# Patient Record
Sex: Female | Born: 1950 | Race: White | Hispanic: No | Smoking: Former smoker
Health system: Southern US, Community
[De-identification: ages and names within clinical notes are randomized; demographics above are authoritative.]

## PROBLEM LIST (undated history)

## (undated) DIAGNOSIS — E785 Hyperlipidemia, unspecified: Secondary | ICD-10-CM

## (undated) DIAGNOSIS — M1990 Unspecified osteoarthritis, unspecified site: Secondary | ICD-10-CM

## (undated) DIAGNOSIS — F329 Major depressive disorder, single episode, unspecified: Secondary | ICD-10-CM

## (undated) DIAGNOSIS — N189 Chronic kidney disease, unspecified: Secondary | ICD-10-CM

## (undated) DIAGNOSIS — H3530 Unspecified macular degeneration: Secondary | ICD-10-CM

## (undated) DIAGNOSIS — J40 Bronchitis, not specified as acute or chronic: Secondary | ICD-10-CM

## (undated) DIAGNOSIS — H409 Unspecified glaucoma: Secondary | ICD-10-CM

## (undated) DIAGNOSIS — M8580 Other specified disorders of bone density and structure, unspecified site: Secondary | ICD-10-CM

## (undated) DIAGNOSIS — G4730 Sleep apnea, unspecified: Secondary | ICD-10-CM

## (undated) DIAGNOSIS — K219 Gastro-esophageal reflux disease without esophagitis: Secondary | ICD-10-CM

## (undated) DIAGNOSIS — F32A Depression, unspecified: Secondary | ICD-10-CM

## (undated) HISTORY — PX: CHOLECYSTECTOMY: SHX55

## (undated) HISTORY — PX: BUNIONECTOMY: SHX129

## (undated) HISTORY — DX: Hyperlipidemia, unspecified: E78.5

## (undated) HISTORY — DX: Unspecified glaucoma: H40.9

## (undated) HISTORY — DX: Bronchitis, not specified as acute or chronic: J40

## (undated) HISTORY — PX: HERNIA REPAIR: SHX51

## (undated) HISTORY — DX: Chronic kidney disease, unspecified: N18.9

## (undated) HISTORY — DX: Unspecified osteoarthritis, unspecified site: M19.90

## (undated) HISTORY — DX: Unspecified macular degeneration: H35.30

## (undated) HISTORY — DX: Major depressive disorder, single episode, unspecified: F32.9

## (undated) HISTORY — DX: Gastro-esophageal reflux disease without esophagitis: K21.9

## (undated) HISTORY — DX: Depression, unspecified: F32.A

---
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
## 2001-01-03 ENCOUNTER — Other Ambulatory Visit: Admission: RE | Admit: 2001-01-03 | Discharge: 2001-01-03 | Payer: Self-pay | Admitting: Family Medicine

## 2001-06-11 ENCOUNTER — Encounter: Admission: RE | Admit: 2001-06-11 | Discharge: 2001-06-11 | Payer: Self-pay | Admitting: Family Medicine

## 2001-06-11 ENCOUNTER — Encounter: Payer: Self-pay | Admitting: Family Medicine

## 2001-06-21 ENCOUNTER — Encounter: Payer: Self-pay | Admitting: Family Medicine

## 2001-06-21 ENCOUNTER — Encounter: Admission: RE | Admit: 2001-06-21 | Discharge: 2001-06-21 | Payer: Self-pay | Admitting: Family Medicine

## 2002-02-04 ENCOUNTER — Other Ambulatory Visit: Admission: RE | Admit: 2002-02-04 | Discharge: 2002-02-04 | Payer: Self-pay | Admitting: Family Medicine

## 2002-02-06 ENCOUNTER — Encounter: Payer: Self-pay | Admitting: Family Medicine

## 2002-02-10 ENCOUNTER — Ambulatory Visit (HOSPITAL_BASED_OUTPATIENT_CLINIC_OR_DEPARTMENT_OTHER): Admission: RE | Admit: 2002-02-10 | Discharge: 2002-02-10 | Payer: Self-pay | Admitting: Urology

## 2002-09-17 ENCOUNTER — Encounter: Admission: RE | Admit: 2002-09-17 | Discharge: 2002-09-17 | Payer: Self-pay | Admitting: Family Medicine

## 2002-09-17 ENCOUNTER — Encounter: Payer: Self-pay | Admitting: Family Medicine

## 2002-11-28 ENCOUNTER — Ambulatory Visit (HOSPITAL_COMMUNITY): Admission: RE | Admit: 2002-11-28 | Discharge: 2002-11-28 | Payer: Self-pay | Admitting: Gastroenterology

## 2002-11-28 ENCOUNTER — Encounter (INDEPENDENT_AMBULATORY_CARE_PROVIDER_SITE_OTHER): Payer: Self-pay | Admitting: *Deleted

## 2003-03-09 ENCOUNTER — Other Ambulatory Visit: Admission: RE | Admit: 2003-03-09 | Discharge: 2003-03-09 | Payer: Self-pay | Admitting: Family Medicine

## 2003-05-21 ENCOUNTER — Encounter: Admission: RE | Admit: 2003-05-21 | Discharge: 2003-05-21 | Payer: Self-pay | Admitting: Family Medicine

## 2003-12-24 ENCOUNTER — Encounter: Admission: RE | Admit: 2003-12-24 | Discharge: 2003-12-24 | Payer: Self-pay | Admitting: Family Medicine

## 2004-02-24 ENCOUNTER — Other Ambulatory Visit: Admission: RE | Admit: 2004-02-24 | Discharge: 2004-02-24 | Payer: Self-pay | Admitting: Family Medicine

## 2005-01-09 ENCOUNTER — Encounter: Admission: RE | Admit: 2005-01-09 | Discharge: 2005-01-09 | Payer: Self-pay | Admitting: Family Medicine

## 2005-03-31 ENCOUNTER — Other Ambulatory Visit: Admission: RE | Admit: 2005-03-31 | Discharge: 2005-03-31 | Payer: Self-pay | Admitting: Family Medicine

## 2006-03-05 ENCOUNTER — Encounter: Admission: RE | Admit: 2006-03-05 | Discharge: 2006-03-05 | Payer: Self-pay | Admitting: Family Medicine

## 2006-05-01 DIAGNOSIS — N189 Chronic kidney disease, unspecified: Secondary | ICD-10-CM

## 2006-05-01 HISTORY — DX: Chronic kidney disease, unspecified: N18.9

## 2007-06-10 ENCOUNTER — Encounter: Admission: RE | Admit: 2007-06-10 | Discharge: 2007-06-10 | Payer: Self-pay | Admitting: Family Medicine

## 2007-06-12 ENCOUNTER — Encounter: Admission: RE | Admit: 2007-06-12 | Discharge: 2007-06-12 | Payer: Self-pay | Admitting: Family Medicine

## 2007-12-31 ENCOUNTER — Encounter: Admission: RE | Admit: 2007-12-31 | Discharge: 2007-12-31 | Payer: Self-pay | Admitting: Family Medicine

## 2008-01-13 ENCOUNTER — Encounter: Admission: RE | Admit: 2008-01-13 | Discharge: 2008-01-13 | Payer: Self-pay | Admitting: Family Medicine

## 2008-01-30 LAB — CONVERTED CEMR LAB: Pap Smear: NORMAL

## 2008-02-03 ENCOUNTER — Ambulatory Visit (HOSPITAL_COMMUNITY): Admission: RE | Admit: 2008-02-03 | Discharge: 2008-02-03 | Payer: Self-pay | Admitting: Family Medicine

## 2008-03-23 ENCOUNTER — Ambulatory Visit (HOSPITAL_COMMUNITY): Admission: RE | Admit: 2008-03-23 | Discharge: 2008-03-24 | Payer: Self-pay | Admitting: General Surgery

## 2008-03-23 ENCOUNTER — Encounter (INDEPENDENT_AMBULATORY_CARE_PROVIDER_SITE_OTHER): Payer: Self-pay | Admitting: General Surgery

## 2008-04-16 ENCOUNTER — Ambulatory Visit: Payer: Self-pay | Admitting: Family Medicine

## 2008-04-16 DIAGNOSIS — R7309 Other abnormal glucose: Secondary | ICD-10-CM | POA: Insufficient documentation

## 2008-04-16 DIAGNOSIS — M1990 Unspecified osteoarthritis, unspecified site: Secondary | ICD-10-CM | POA: Insufficient documentation

## 2008-04-16 DIAGNOSIS — J45990 Exercise induced bronchospasm: Secondary | ICD-10-CM | POA: Insufficient documentation

## 2008-04-16 DIAGNOSIS — Z9889 Other specified postprocedural states: Secondary | ICD-10-CM | POA: Insufficient documentation

## 2008-04-16 DIAGNOSIS — J309 Allergic rhinitis, unspecified: Secondary | ICD-10-CM | POA: Insufficient documentation

## 2008-04-16 DIAGNOSIS — F3289 Other specified depressive episodes: Secondary | ICD-10-CM | POA: Insufficient documentation

## 2008-04-16 DIAGNOSIS — F329 Major depressive disorder, single episode, unspecified: Secondary | ICD-10-CM | POA: Insufficient documentation

## 2008-05-13 ENCOUNTER — Ambulatory Visit: Payer: Self-pay | Admitting: Family Medicine

## 2008-05-15 LAB — CONVERTED CEMR LAB
ALT: 25 units/L (ref 0–35)
BUN: 9 mg/dL (ref 6–23)
Bilirubin, Direct: 0.1 mg/dL (ref 0.0–0.3)
CO2: 29 meq/L (ref 19–32)
Cholesterol: 269 mg/dL (ref 0–200)
Creatinine, Ser: 1.4 mg/dL — ABNORMAL HIGH (ref 0.4–1.2)
Direct LDL: 199.7 mg/dL
GFR calc Af Amer: 50 mL/min
Glucose, Bld: 121 mg/dL — ABNORMAL HIGH (ref 70–99)
HDL: 43.5 mg/dL (ref >39.0)
Potassium: 5.3 meq/L — ABNORMAL HIGH (ref 3.5–5.1)
Total Bilirubin: 0.9 mg/dL (ref 0.3–1.2)
Total Protein: 6.7 g/dL (ref 6.0–8.3)
Triglycerides: 136 mg/dL (ref 0–149)
VLDL: 27 mg/dL (ref 0–40)

## 2008-05-20 ENCOUNTER — Ambulatory Visit: Payer: Self-pay | Admitting: Family Medicine

## 2008-05-20 DIAGNOSIS — E7800 Pure hypercholesterolemia, unspecified: Secondary | ICD-10-CM | POA: Insufficient documentation

## 2008-05-20 LAB — CONVERTED CEMR LAB
BUN: 10 mg/dL (ref 6–23)
CO2: 32 meq/L (ref 19–32)
Calcium: 10.1 mg/dL (ref 8.4–10.5)
Chloride: 107 meq/L (ref 96–112)
Potassium: 4.6 meq/L (ref 3.5–5.1)

## 2008-05-27 ENCOUNTER — Encounter: Admission: RE | Admit: 2008-05-27 | Discharge: 2008-05-27 | Payer: Self-pay | Admitting: Family Medicine

## 2008-05-29 ENCOUNTER — Encounter: Payer: Self-pay | Admitting: Family Medicine

## 2008-05-29 ENCOUNTER — Encounter: Admission: RE | Admit: 2008-05-29 | Discharge: 2008-05-29 | Payer: Self-pay | Admitting: Family Medicine

## 2008-05-29 ENCOUNTER — Encounter (INDEPENDENT_AMBULATORY_CARE_PROVIDER_SITE_OTHER): Payer: Self-pay | Admitting: *Deleted

## 2008-06-01 ENCOUNTER — Encounter (INDEPENDENT_AMBULATORY_CARE_PROVIDER_SITE_OTHER): Payer: Self-pay | Admitting: *Deleted

## 2008-08-13 ENCOUNTER — Ambulatory Visit: Payer: Self-pay | Admitting: Family Medicine

## 2008-08-20 ENCOUNTER — Ambulatory Visit: Payer: Self-pay | Admitting: Family Medicine

## 2008-08-20 DIAGNOSIS — N183 Chronic kidney disease, stage 3 (moderate): Secondary | ICD-10-CM

## 2008-08-20 LAB — CONVERTED CEMR LAB
ALT: 18 units/L (ref 0–35)
AST: 23 units/L (ref 0–37)
Albumin: 3.6 g/dL (ref 3.5–5.2)
Alkaline Phosphatase: 91 units/L (ref 39–117)
BUN: 13 mg/dL (ref 6–23)
Bilirubin, Direct: 0.1 mg/dL (ref 0.0–0.3)
CO2: 31 meq/L (ref 19–32)
Calcium: 9.5 mg/dL (ref 8.4–10.5)
Direct LDL: 197.5 mg/dL
Glucose, Bld: 129 mg/dL — ABNORMAL HIGH (ref 70–99)
HDL: 44.9 mg/dL (ref >39.00)
LDL Goal: 130 mg/dL
Potassium: 4.9 meq/L (ref 3.5–5.1)
Sodium: 142 meq/L (ref 135–145)
Total CHOL/HDL Ratio: 6
Total Protein: 6.7 g/dL (ref 6.0–8.3)
Triglycerides: 134 mg/dL (ref 0.0–149.0)
VLDL: 26.8 mg/dL (ref 0.0–40.0)

## 2008-11-23 ENCOUNTER — Ambulatory Visit: Payer: Self-pay | Admitting: Family Medicine

## 2008-11-25 LAB — CONVERTED CEMR LAB
AST: 20 units/L (ref 0–37)
Albumin: 3.8 g/dL (ref 3.5–5.2)
BUN: 12 mg/dL (ref 6–23)
CO2: 30 meq/L (ref 19–32)
Chloride: 106 meq/L (ref 96–112)
GFR calc non Af Amer: 44.54 mL/min (ref >60)
Glucose, Bld: 123 mg/dL — ABNORMAL HIGH (ref 70–99)
HDL: 42.5 mg/dL (ref >39.00)
Potassium: 4.8 meq/L (ref 3.5–5.1)
Total Bilirubin: 0.9 mg/dL (ref 0.3–1.2)
Total Protein: 6.8 g/dL (ref 6.0–8.3)
Triglycerides: 96 mg/dL (ref 0.0–149.0)

## 2009-01-11 ENCOUNTER — Telehealth: Payer: Self-pay | Admitting: Family Medicine

## 2009-05-28 ENCOUNTER — Ambulatory Visit: Payer: Self-pay | Admitting: Family Medicine

## 2009-06-02 ENCOUNTER — Encounter: Admission: RE | Admit: 2009-06-02 | Discharge: 2009-06-02 | Payer: Self-pay | Admitting: Family Medicine

## 2009-06-02 LAB — CONVERTED CEMR LAB
ALT: 20 units/L (ref 0–35)
AST: 20 units/L (ref 0–37)
HDL: 45.8 mg/dL (ref >39.00)
Total CHOL/HDL Ratio: 5
Triglycerides: 101 mg/dL (ref 0.0–149.0)

## 2009-06-09 ENCOUNTER — Other Ambulatory Visit: Admission: RE | Admit: 2009-06-09 | Discharge: 2009-06-09 | Payer: Self-pay | Admitting: Family Medicine

## 2009-06-09 ENCOUNTER — Ambulatory Visit: Payer: Self-pay | Admitting: Family Medicine

## 2009-06-09 LAB — CONVERTED CEMR LAB
Bilirubin Urine: NEGATIVE
Blood in Urine, dipstick: NEGATIVE
Ketones, urine, test strip: NEGATIVE
Nitrite: NEGATIVE
pH: 5

## 2009-06-11 ENCOUNTER — Encounter (INDEPENDENT_AMBULATORY_CARE_PROVIDER_SITE_OTHER): Payer: Self-pay | Admitting: *Deleted

## 2009-07-22 ENCOUNTER — Ambulatory Visit: Payer: Self-pay | Admitting: Family Medicine

## 2009-11-24 ENCOUNTER — Ambulatory Visit: Payer: Self-pay | Admitting: Family Medicine

## 2009-11-27 LAB — CONVERTED CEMR LAB
AST: 19 units/L (ref 0–37)
Alkaline Phosphatase: 80 units/L (ref 39–117)
CO2: 26 meq/L (ref 19–32)
Chloride: 103 meq/L (ref 96–112)
Creatinine, Ser: 1.2 mg/dL (ref 0.4–1.2)
Sodium: 135 meq/L (ref 135–145)
Total Bilirubin: 0.8 mg/dL (ref 0.3–1.2)
Total CHOL/HDL Ratio: 4
Triglycerides: 102 mg/dL (ref 0.0–149.0)
VLDL: 20.4 mg/dL (ref 0.0–40.0)

## 2009-12-23 ENCOUNTER — Ambulatory Visit: Payer: Self-pay | Admitting: Family Medicine

## 2010-01-13 ENCOUNTER — Emergency Department: Payer: Self-pay | Admitting: Emergency Medicine

## 2010-02-04 ENCOUNTER — Ambulatory Visit: Payer: Self-pay | Admitting: Family Medicine

## 2010-02-04 LAB — CONVERTED CEMR LAB
Nitrite: POSITIVE
Urobilinogen, UA: 0.2
pH: 6

## 2010-02-05 ENCOUNTER — Encounter: Payer: Self-pay | Admitting: Family Medicine

## 2010-02-21 ENCOUNTER — Ambulatory Visit: Payer: Self-pay | Admitting: Family Medicine

## 2010-05-03 ENCOUNTER — Ambulatory Visit
Admission: RE | Admit: 2010-05-03 | Discharge: 2010-05-03 | Payer: Self-pay | Source: Home / Self Care | Attending: Family Medicine | Admitting: Family Medicine

## 2010-05-05 LAB — CONVERTED CEMR LAB
ALT: 19 units/L (ref 0–35)
AST: 21 units/L (ref 0–37)
Cholesterol: 200 mg/dL (ref 0–200)
HDL: 43.8 mg/dL (ref >39.00)
LDL Cholesterol: 130 mg/dL — ABNORMAL HIGH (ref 0–99)
Total CHOL/HDL Ratio: 5
VLDL: 26 mg/dL (ref 0.0–40.0)

## 2010-05-31 NOTE — Assessment & Plan Note (Signed)
Summary: SINUS INFECTION/DLO   Vital Signs:  Patient profile:   60 year old female Height:      64 inches Weight:      190.50 pounds BMI:     32.82 Temp:     98.2 degrees F oral Pulse rate:   84 / minute Pulse rhythm:   regular BP sitting:   132 / 80  (left arm) Cuff size:   regular  Vitals Entered By: Delilah Shan CMA Duncan Dull) (February 21, 2010 12:14 PM) CC: ? sinus infection   History of Present Illness: "I'm some better today."  Sneezing started over the weekend.  Pain in face and watering eyes.  Prev with tooth pain.  Prev with ear pain.  Took pseudophed with some relief of symptoms but it made the patient jittery.  No fevers.  Occ cough but it happens in fits.  No sputum.  Nasal congestion is ongoing.  Clear rhinorrhea.    Allergies: No Known Drug Allergies  Review of Systems       See HPI.  Otherwise negative.    Physical Exam  General:  GEN: nad, alert and oriented HEENT: mucous membranes moist, TM w/o erythema, nasal epithelium injected, OP with cobblestoning, max sinuses minimally tender to palpation, frontal sinuses not tender to palpation  NECK: supple w/o LA CV: rrr. PULM: ctab, no inc wob ABD: soft, +bs EXT: no edema    Impression & Recommendations:  Problem # 1:  SINUSITIS - ACUTE-NOS (ICD-461.9) I d/w patient [id number].  I would hold on antibiotics for now and start nasal steroids.  If she resolves, no need to use antibiotics.  If symptoms worsen or persist, then I would fill the amoxil.  She agrees/understands.  If she continues to have persistent symptoms, then ENT eval may be of use.  The following medications were removed from the medication list:    Septra Ds 800-160 Mg Tabs (Sulfamethoxazole-trimethoprim) .Marland Kitchen... 1 by mouth two times a day x3days Her updated medication list for this problem includes:    Amoxicillin 875 Mg Tabs (Amoxicillin) .Marland Kitchen... 1 by mouth two times a day x10d    Flonase 50 Mcg/act Susp (Fluticasone propionate) .Marland Kitchen... 2 sprays per  nostril per day  Complete Medication List: 1)  Celexa 40 Mg Tabs (Citalopram hydrobromide) .... Take 1 tablet by mouth once a day 2)  Proair Hfa 108 (90 Base) Mcg/act Aers (Albuterol sulfate) .... As needed 3)  Qvar 40 Mcg/act Aers (Beclomethasone dipropionate) .... As needed 4)  Vitamin D 1000 Unit Tabs (Cholecalciferol) .... Take 1 tablet by mouth once a day 5)  Simvastatin 40 Mg Tabs (Simvastatin) .Marland Kitchen.. 1 tab by mouth daily 6)  Amoxicillin 875 Mg Tabs (Amoxicillin) .Marland Kitchen.. 1 by mouth two times a day x10d 7)  Flonase 50 Mcg/act Susp (Fluticasone propionate) .... 2 sprays per nostril per day  Patient Instructions: 1)  I would start the flonase and fill the antibiotics if you aren't getting better.  Using the flonase may make it less likely for you to have this as much in the future.  Take care.  Let us know if you have other concerns.   Prescriptions: FLONASE 50 MCG/ACT SUSP (FLUTICASONE PROPIONATE) 2 sprays per nostril per day  #1 x 12   Entered and Authorized by:   Crawford Givens MD   Signed by:   Crawford Givens MD on 02/21/2010   Method used:   Electronically to        [id number]  [address]       [place], [place]  [address]       Ph: [phone]       Fax: [phone]   RxID:   [id number] AMOXICILLIN 875 MG TABS (AMOXICILLIN) 1 by mouth two times a day x10d  #20 x 0   Entered and Authorized by:   Crawford Givens MD   Signed by:   Crawford Givens MD on 02/21/2010   Method used:   Print then Give to Patient   RxID:   [id number]    Orders Added: 1)  Est. Patient Level III [61607]    Current Allergies (reviewed today): No known allergies

## 2010-05-31 NOTE — Assessment & Plan Note (Signed)
Summary: cpx/r/s from 2:45   Vital Signs:  Patient profile:   60 year old female Height:      64 inches Weight:      190.38 pounds BMI:     32.80 Temp:     98.2 degrees F oral Pulse rate:   68 / minute Pulse rhythm:   regular BP sitting:   112 / 64  (left arm) Cuff size:   large  Vitals Entered By: Linde Gillis CMA Duncan Dull) (June 09, 2009 10:16 AM) CC: 30 minute exam, Lipid Management   History of Present Illness: Doing well overall. The patient is here for annual wellness exam and preventative care.    No acute concerns.  Depression, was previously well controlle don celexa..has been out due to insurance change in 03/2009. Requests refill.  7lb weight loss..eaeting healthfully.  Has gone back to work Systems developer. No  regular exercise.  Lipid Management History:      Positive NCEP/ATP III risk factors include female age 68 years old or older and current tobacco user.        The patient expresses understanding of adjunctive measures for cholesterol lowering.  Adjunctive measures started by the patient include aerobic exercise, fiber, and weight reduction.  She expresses no side effects from her lipid-lowering medication.  The patient denies any symptoms to suggest myopathy or liver disease.  Comments include: Poor control off simvastain. .     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 Take 1 Tablet By Mouth Once A Day 2)  Proair Hfa 108 (90 Base)  Mcg/act Aers (Albuterol Sulfate) .... As Needed 3)  Qvar 40 Mcg/act Aers (Beclomethasone Dipropionate) .... As Needed 4)  Vitamin D 1000 Unit  Tabs (Cholecalciferol) .... Take 1 Tablet By Mouth Once A Day 5)  Simvastatin 40 Mg Tabs (Simvastatin) .Marland Kitchen.. 1 Tab By Mouth Daily  Allergies (verified): No Known Drug Allergies  Past History:  Past medical, surgical, family and social histories (including risk factors) reviewed, and no changes noted (except as noted below).  Past Medical History: Reviewed history from 04/16/2008 and no changes required. Osteoarthritis, knees Depression Allergic rhinitis  Past Surgical History: Reviewed history from 04/16/2008 and no changes required. Cholecystectomy 03/23/08 Tonsillectomy  Family History: Reviewed history from 04/16/2008 and no changes required. father: prostate cancer mother: dementia brother:  DM sister; prediabetes NO CAD early age uncle: melanoma  Social History: Reviewed history from 04/16/2008 and no changes required. Retired Divorced Never Smoked Alcohol use-yes, occ once a month Current Smoker Drug use-no Regular exercise-no Diet:  moderate diet, good calcium intake  Review of Systems General:  Denies fatigue. CV:  Denies chest pain or discomfort. Resp:  Denies shortness of breath. GI:  Denies abdominal pain. GU:  Denies dysuria.  Physical Exam  General:  Well-developed,well-nourished,in no acute distress; alert,appropriate and cooperative throughout examination Ears:  External ear exam shows no significant lesions or deformities.  Otoscopic examination reveals clear canals, tympanic membranes are intact bilaterally without bulging, retraction, inflammation or discharge. Hearing is  grossly normal bilaterally. Nose:  External nasal examination shows no deformity or inflammation. Nasal mucosa are pink and moist without lesions or exudates. Mouth:  MMM Neck:  no carotid bruit or thyromegaly no cervical or  supraclavicular lymphadenopathy  Chest Wall:  No deformities, masses, or tenderness noted. Breasts:  No mass, nodules, thickening, tenderness, bulging, retraction, inflamation, nipple discharge or skin changes noted.   Lungs:  Normal respiratory effort, chest expands symmetrically. Lungs are clear to auscultation, no crackles or wheezes. Heart:  Normal rate and regular rhythm. S1 and S2 normal without gallop, murmur, click, rub or other extra sounds. Abdomen:  Bowel sounds positive,abdomen soft and non-tender without masses, organomegaly or hernias noted. Genitalia:  Pelvic Exam:        External: normal female genitalia without lesions or masses        Vagina: normal without lesions or masses        Cervix: normal without lesions or masses        Adnexa: normal bimanual exam without masses or fullness        Uterus: normal by palpation        Pap smear: performed Msk:  No deformity or scoliosis noted of thoracic or lumbar spine.   Pulses:  R and L posterior tibial pulses are full and equal bilaterally  Extremities:  no edema Skin:  Intact without suspicious lesions or rashes Psych:  Cognition and judgment appear intact. Alert and cooperative with normal attention span and concentration. No apparent delusions, illusions, hallucinations   Impression & Recommendations:  Problem # 1:  WELL WOMAN (ICD-V70.0) The patient's preventative maintenance and recommended screening tests for an annual wellness exam were reviewed in full today. Brought up to date unless services declined.  Counselled on the importance of diet, exercise, and its role in overall health and mortality. The patient's FH and SH was reviewed, including their home life, tobacco status, and drug and alcohol status.     Problem # 2:  ROUTINE GYNECOLOGICAL EXAMINATION (ICD-V72.31) PAp pending. NO concerning symptoms or signs.   Complete Medication List: 1)  Celexa 40 Mg Tabs (Citalopram hydrobromide) .... Take 1 tablet by  mouth once a day 2)  Proair Hfa 108 (90 Base) Mcg/act Aers (Albuterol sulfate) .... As needed 3)  Qvar 40 Mcg/act Aers (Beclomethasone dipropionate) .... As needed 4)  Vitamin D 1000 Unit Tabs (Cholecalciferol) .... Take 1 tablet by mouth once a day 5)  Simvastatin 40 Mg Tabs (Simvastatin) .Marland Kitchen.. 1 tab by mouth daily  Lipid Assessment/Plan:      Based on NCEP/ATP III, the patient's risk factor category is "2 or more risk factors and a calculated 10 year CAD risk of < 20%".  The patient's lipid goals are as follows: Total cholesterol goal is 200; LDL cholesterol goal is 130; HDL cholesterol goal is 40; Triglyceride goal is 150.  Her LDL cholesterol goal has not been met.    Patient Instructions: 1)  Fasting lipids, CMET Dx 272.0 in 6 months. 2)  Please schedule a follow-up appointment in 1 year.  Prescriptions: SIMVASTATIN 40 MG TABS (SIMVASTATIN) 1 tab by mouth daily  #90 x 3   Entered and Authorized by:   Kerby Nora MD   Signed by:   Kerby Nora MD on 06/09/2009   Method used:   Electronically to        [id number]       [address]       [place], [place]  [address]       Ph: [phone]       Fax: [phone]   RxID:   [id number] CELEXA 40 MG TABS (CITALOPRAM HYDROBROMIDE) Take 1 tablet by mouth once a day  #90 x 3   Entered and Authorized by:   Kerby Nora MD   Signed by:   Kerby Nora MD on 06/09/2009   Method used:   Electronically to        [id number]       [address]       [place], [place]  [address]       Ph: [phone]       Fax: [phone]   RxID:   [id number]   Current Allergies (reviewed today): No known allergies   Laboratory Results   Urine Tests   Date/Time Reported: June 09, 2009 10:33 AM   Routine Urinalysis   Color: yellow Appearance: Clear Glucose: negative   (Normal Range: Negative) Bilirubin: negative   (Normal Range: Negative) Ketone: negative   (Normal Range:  Negative) Spec. Gravity: 1.025   (Normal Range: 1.003-1.035) Blood: negative   (Normal Range: Negative) pH: 5.0   (Normal Range: 5.0-8.0) Protein: trace   (Normal Range: Negative) Urobilinogen: 0.2   (Normal Range: 0-1) Nitrite: negative   (Normal Range: Negative) Leukocyte Esterace: negative   (Normal Range: Negative)

## 2010-05-31 NOTE — Assessment & Plan Note (Signed)
Summary: ?SINUS INFECTION/CLE   Vital Signs:  Patient profile:   60 year old female Height:      64 inches Weight:      189.0 pounds BMI:     32.56 Temp:     98.6 degrees F oral Pulse rate:   68 / minute Pulse rhythm:   regular BP sitting:   110 / 78  (left arm) Cuff size:   large  Vitals Entered By: Benny Lennert CMA Duncan Dull) (December 23, 2009 9:48 AM)  History of Present Illness: Chief complaint Sinus Infection  60 year old female:  No allergy flare  has some chronic sinus pain always sinffling.     Acute Visit History:      The patient complains of headache and sinus problems.  These symptoms began 2 days ago.  Other comments include: allergies, sinus at baseline.        She complains of sinus pressure, teeth aching, ears being blocked, nasal congestion, purulent drainage, and frontal headache.  The patient has had a past history of sinusitis.        Urine output has been normal.  She is tolerating clear liquids.        Allergies (verified): No Known Drug Allergies  Review of Systems       REVIEW OF SYSTEMS GEN: Acute illness details above. CV: No chest pain or SOB GI: No noted N or V Otherwise, pertinent positives and negatives are noted in the HPI.   Physical Exam  Additional Exam:  Gen: WDWN, NAD; alert,appropriate and cooperative throughout exam  HEENT: Normocephalic and atraumatic. Throat clear, w/o exudate, no LAD, R TM clear, L TM - good landmarks, No fluid present. rhinnorhea.  Left frontal and maxillary sinuses: Tender max Right frontal and maxillary sinuses: Tender max  Neck: No ant or post LAD  CV: RRR, No M/G/R  Pulm: Breathing comfortably in no resp distress. no w/c/r  Abd: S,NT,ND,+BS  Extr: no c/c/e  Psych: full affect, pleasant    Impression & Recommendations:  Problem # 1:  SINUSITIS - ACUTE-NOS (ICD-461.9)  Amox Veramyst sample  The following medications were removed from the medication list:    Amoxicillin 500 Mg Tabs  (Amoxicillin) .Marland KitchenMarland KitchenMarland KitchenMarland Kitchen 3 by mouth two times a day (high dose sinusitis dosing) Her updated medication list for this problem includes:    Amoxicillin 500 Mg Caps (Amoxicillin) .Marland KitchenMarland KitchenMarland KitchenMarland Kitchen 3 tabs by mouth two times a day (sinusitis dosing)  Instructed on treatment. Call if symptoms persist or worsen.   The following medications were removed from the medication list:    Amoxicillin 500 Mg Tabs (Amoxicillin) .Marland KitchenMarland KitchenMarland KitchenMarland Kitchen 3 by mouth two times a day (high dose sinusitis dosing) Her updated medication list for this problem includes:    Amoxicillin 500 Mg Caps (Amoxicillin) .Marland KitchenMarland KitchenMarland KitchenMarland Kitchen 3 tabs by mouth two times a day (sinusitis dosing)  Complete Medication List: 1)  Celexa 40 Mg Tabs (Citalopram hydrobromide) .... Take 1 tablet by mouth once a day 2)  Proair Hfa 108 (90 Base) Mcg/act Aers (Albuterol sulfate) .... As needed 3)  Qvar 40 Mcg/act Aers (Beclomethasone dipropionate) .... As needed 4)  Vitamin D 1000 Unit Tabs (Cholecalciferol) .... Take 1 tablet by mouth once a day 5)  Simvastatin 40 Mg Tabs (Simvastatin) .Marland Kitchen.. 1 tab by mouth daily 6)  Amoxicillin 500 Mg Caps (Amoxicillin) .... 3 tabs by mouth two times a day (sinusitis dosing) Prescriptions: AMOXICILLIN 500 MG CAPS (AMOXICILLIN) 3 tabs by mouth two times a day (sinusitis dosing)  #60 x 0  Entered and Authorized by:   Hannah Beat MD   Signed by:   Hannah Beat MD on 12/23/2009   Method used:   Electronically to        [id number]       [address]       [place], [place]  [address]       Ph: [phone]       Fax: [phone]   RxID:   [id number]   Current Allergies (reviewed today): No known allergies

## 2010-05-31 NOTE — Assessment & Plan Note (Signed)
Summary: ? UTI   Vital Signs:  Patient profile:   60 year old female Height:      64 inches Weight:      190.12 pounds Temp:     98.0 degrees F oral Pulse rate:   64 / minute Pulse rhythm:   regular BP sitting:   110 / 72  (left arm) Cuff size:   large  Vitals Entered By: Melody Comas (February 04, 2010 3:46 PM) CC: uti   History of Present Illness: dysuria: yes, urgency with decrease in UOP, hesitancy, incomplete voiding duration of symptoms: a few days abdominal pain:no fevers:no back pain:no vomiting:no other concerns: decrease in BM frequency recently   Allergies (verified): No Known Drug Allergies  Review of Systems       See HPI.  Otherwise negative.    Physical Exam  General:  GEN: nad, alert and oriented HEENT: mucous membranes moist NECK: supple CV: rrr.  PULM: ctab, no inc wob ABD: soft, +bs, suprapubic area not tender EXT: no edema SKIN: no acute rash BACK: no CVA pain    Impression & Recommendations:  Problem # 1:  ACUTE CYSTITIS (ICD-595.0) Start septra and check cx.  contact with results.  d/w patient.  she understood.  Nontoxic.  The following medications were removed from the medication list:    Amoxicillin 500 Mg Caps (Amoxicillin) .Marland KitchenMarland KitchenMarland KitchenMarland Kitchen 3 tabs by mouth two times a day (sinusitis dosing) Her updated medication list for this problem includes:    Septra Ds 800-160 Mg Tabs (Sulfamethoxazole-trimethoprim) .Marland Kitchen... 1 by mouth two times a day x3days  Orders: Specimen Handling ([address]) T-Culture, Urine ([id number])  Complete Medication List: 1)  Celexa 40 Mg Tabs (Citalopram hydrobromide) .... Take 1 tablet by mouth once a day 2)  Proair Hfa 108 (90 Base) Mcg/act Aers (Albuterol sulfate) .... As needed 3)  Qvar 40 Mcg/act Aers (Beclomethasone dipropionate) .... As needed 4)  Vitamin D 1000 Unit Tabs (Cholecalciferol) .... Take 1 tablet by mouth once a day 5)  Simvastatin 40 Mg Tabs (Simvastatin) .Marland Kitchen.. 1 tab by mouth daily 6)  Septra Ds 800-160  Mg Tabs (Sulfamethoxazole-trimethoprim) .Marland Kitchen.. 1 by mouth two times a day x3days  Patient Instructions: 1)  Take the septra 1 by mouth two times a day for three days and let me know if you aren't feeling better. Take care.  Prescriptions: SEPTRA DS 800-160 MG TABS (SULFAMETHOXAZOLE-TRIMETHOPRIM) 1 by mouth two times a day x3days  #6 x 0   Entered and Authorized by:   Crawford Givens MD   Signed by:   Crawford Givens MD on 02/04/2010   Method used:   Electronically to        [id number]       [address]       [place], [place]  [address]       Ph: [phone]       Fax: [phone]   RxID:   [id number]   Laboratory Results   Urine Tests  Date/Time Received: February 04, 2010 3:59 PM.d Date/Time Reported: February 04, 2010 3:59 PM  Routine Urinalysis   Color: yellow Appearance: Hazy Glucose: negative   (Normal Range: Negative) Bilirubin: negative   (Normal Range: Negative) Ketone: negative   (Normal Range: Negative) Spec. Gravity: 1.015   (Normal Range: 1.003-1.035) Blood: small   (Normal Range: Negative) pH: 6.0   (Normal Range: 5.0-8.0) Protein: trace   (Normal Range: Negative) Urobilinogen: 0.2   (Normal Range: 0-1) Nitrite: positive   (  Normal Range: Negative) Leukocyte Esterace: moderate   (Normal Range: Negative)        Current Allergies (reviewed today): No known allergies

## 2010-05-31 NOTE — Assessment & Plan Note (Signed)
Summary: sinus/dlo   Vital Signs:  Patient profile:   60 year old female Height:      64 inches Weight:      191.4 pounds BMI:     32.97 Temp:     98.8 degrees F oral Pulse rate:   68 / minute Pulse rhythm:   regular BP sitting:   110 / 70  (left arm) Cuff size:   large  Vitals Entered By: Benny Lennert CMA Duncan Dull) (July 22, 2009 3:07 PM)  History of Present Illness: Chief complaint ? sinus infection  Acute Visit History:      The patient complains of headache, nasal discharge, sinus problems, and sore throat.  These symptoms began 6 days ago.  She denies abdominal pain, chest pain, nausea, and vomiting.        She complains of sinus pressure, teeth aching, ears being blocked, nasal congestion, purulent drainage, and frontal headache.  The patient has had a past history of sinusitis.        Urine output has been normal.  She is tolerating clear liquids.        Allergies (verified): No Known Drug Allergies  Past History:  Past medical, surgical, family and social histories (including risk factors) reviewed, and no changes noted (except as noted below).  Past Medical History: Reviewed history from 04/16/2008 and no changes required. Osteoarthritis, knees Depression Allergic rhinitis  Past Surgical History: Reviewed history from 04/16/2008 and no changes required. Cholecystectomy 03/23/08 Tonsillectomy  Family History: Reviewed history from 04/16/2008 and no changes required. father: prostate cancer mother: dementia brother:  DM sister; prediabetes NO CAD early age uncle: melanoma  Social History: Reviewed history from 04/16/2008 and no changes required. Retired Divorced Never Smoked Alcohol use-yes, occ once a month Current Smoker Drug use-no Regular exercise-no Diet:  moderate diet, good calcium intake  Review of Systems       REVIEW OF SYSTEMS GEN: Acute illness details above. CV: No chest pain or SOB GI: No noted N or V Otherwise, pertinent  positives and negatives are noted in the HPI.   Physical Exam  Additional Exam:  Gen: WDWN, NAD; alert,appropriate and cooperative throughout exam  HEENT: Normocephalic and atraumatic. Throat clear, w/o exudate, no LAD, R TM clear, L TM - good landmarks, No fluid present. rhinnorhea.  Left frontal and maxillary sinuses: Tender Right frontal and maxillary sinuses: Tender  Neck: No ant or post LAD  CV: RRR, No M/G/R  Pulm: Breathing comfortably in no resp distress. no w/c/r  Abd: S,NT,ND,+BS  Extr: no c/c/e  Psych: full affect, pleasant    Impression & Recommendations:  Problem # 1:  SINUSITIS - ACUTE-NOS (ICD-461.9) Assessment New  Her updated medication list for this problem includes:    Amoxicillin 500 Mg Tabs (Amoxicillin) .Marland KitchenMarland KitchenMarland KitchenMarland Kitchen 3 by mouth two times a day (high dose sinusitis dosing)  Complete Medication List: 1)  Celexa 40 Mg Tabs (Citalopram hydrobromide) .... Take 1 tablet by mouth once a day 2)  Proair Hfa 108 (90 Base) Mcg/act Aers (Albuterol sulfate) .... As needed 3)  Qvar 40 Mcg/act Aers (Beclomethasone dipropionate) .... As needed 4)  Vitamin D 1000 Unit Tabs (Cholecalciferol) .... Take 1 tablet by mouth once a day 5)  Simvastatin 40 Mg Tabs (Simvastatin) .Marland Kitchen.. 1 tab by mouth daily 6)  Amoxicillin 500 Mg Tabs (Amoxicillin) .... 3 by mouth two times a day (high dose sinusitis dosing)  Patient Instructions: 1)  SINUSITIS 2)  Sinuses are cavities in facial skeleton that drain to  nose. Impaired drainage and obstruction of sinus passages main cause. 3)  Treatment: 4)  1. Take all Antibiotics 5)  2. Open nasal and sinus canals: Oral decongestant: Sudafed. (CAUTION IF HIGH BLOOD PRESSURE) 6)  3. Steam inhalation 7)  4. Humidifier in room 8)  5. Frequent nasal saline irrigation 9)  6. Moist heat compresses to face 10)  7. Tylenol or Ibuprofen for pain and fever, follow directions on bottle.  Prescriptions: AMOXICILLIN 500 MG TABS (AMOXICILLIN) 3 by mouth two times  a day (high dose sinusitis dosing)  #60 x 0   Entered and Authorized by:   Hannah Beat MD   Signed by:   Hannah Beat MD on 07/22/2009   Method used:   Electronically to        [id number]       [address]       [place], [place]  [address]       Ph: [phone]       Fax: [phone]   RxID:   [id number]   Current Allergies (reviewed today): No known allergies

## 2010-05-31 NOTE — Letter (Signed)
Summary: Results Follow up Letter  [facility]  [address] [place], [place] [address]   Phone: [phone]  Fax: [phone]    06/11/2009 MRN: [id number]     Uri Scism [address] [place], [place]  [address]    Dear Ms. Capito,  The following are the results of your recent test(s):  Test         Result    Pap Smear:        Normal ___x__  Not Normal _____ Comments:Repeat in 2 years ______________________________________________________ Cholesterol: LDL(Bad cholesterol):         Your goal is less than:         HDL (Good cholesterol):       Your goal is more than: Comments:  ______________________________________________________ Mammogram:        Normal _____  Not Normal _____ Comments:  ___________________________________________________________________ Hemoccult:        Normal _____  Not normal _______ Comments:    _____________________________________________________________________ Other Tests:    We routinely do not discuss normal results over the telephone.  If you desire a copy of the results, or you have any questions about this information we can discuss them at your next office visit.   Sincerely,   Kerby Nora MD

## 2010-06-09 ENCOUNTER — Other Ambulatory Visit: Payer: Self-pay | Admitting: Family Medicine

## 2010-06-09 DIAGNOSIS — Z1231 Encounter for screening mammogram for malignant neoplasm of breast: Secondary | ICD-10-CM

## 2010-06-15 ENCOUNTER — Encounter: Payer: Self-pay | Admitting: Family Medicine

## 2010-06-15 ENCOUNTER — Ambulatory Visit
Admission: RE | Admit: 2010-06-15 | Discharge: 2010-06-15 | Disposition: A | Discharge status: Home / Self Care | Payer: Self-pay | Source: Ambulatory Visit | Attending: Family Medicine | Admitting: Family Medicine

## 2010-06-15 DIAGNOSIS — Z1231 Encounter for screening mammogram for malignant neoplasm of breast: Secondary | ICD-10-CM

## 2010-06-17 ENCOUNTER — Encounter: Payer: Self-pay | Admitting: Family Medicine

## 2010-06-21 ENCOUNTER — Encounter: Payer: Self-pay | Admitting: Family Medicine

## 2010-06-24 ENCOUNTER — Encounter: Payer: Self-pay | Admitting: Family Medicine

## 2010-06-24 ENCOUNTER — Encounter (INDEPENDENT_AMBULATORY_CARE_PROVIDER_SITE_OTHER): Payer: Self-pay | Admitting: Family Medicine

## 2010-06-24 ENCOUNTER — Other Ambulatory Visit: Payer: Self-pay | Admitting: Family Medicine

## 2010-06-24 DIAGNOSIS — Z01419 Encounter for gynecological examination (general) (routine) without abnormal findings: Secondary | ICD-10-CM

## 2010-06-24 DIAGNOSIS — E039 Hypothyroidism, unspecified: Secondary | ICD-10-CM

## 2010-06-24 DIAGNOSIS — Z0000 Encounter for general adult medical examination without abnormal findings: Secondary | ICD-10-CM

## 2010-06-24 LAB — CONVERTED CEMR LAB

## 2010-06-28 NOTE — Assessment & Plan Note (Addendum)
Summary: resch cpe   Vital Signs:  Patient profile:   60 year old female Height:      64 inches Weight:      191.75 pounds BMI:     33.03 Temp:     98.6 degrees F oral Pulse rate:   84 / minute Pulse rhythm:   regular BP sitting:   100 / 74  (left arm) Cuff size:   regular  Vitals Entered ByMelody Comas (June 24, 2010 10:21 AM) CC: cpx without pap, Lipid Management     Last PAP Date 06/24/2010 Last PAP Result DVE nml, pap every 2-3 years   History of Present Illness: The patient is here for annual wellness exam and preventative care.     She has the following acute or chronic issues:  Depression, well controlled on celexa 40  High cholesterol:at goal LDL <130 on simvastatin  Seen at urgent care 1 week ago for sinsu infection: Started on amoxicillin, flonase, cough syrup with hydrocodone. Improving in last few days.  No SOB, no fever on antibiotics.  Back spasms:improved on  muscle relaxant, vicodin.  Lipid Management History:      Positive NCEP/ATP III risk factors include female age 71 years old or older and current tobacco user.        Adjunctive measures started by the patient include aerobic exercise, fiber, limit alcohol consumpton, and weight reduction.  She expresses no side effects from her lipid-lowering medication.  The patient denies any symptoms to suggest myopathy or liver disease.     Preventive Screening-Counseling & Management  Caffeine-Diet-Exercise     Diet Comments: moderate, occ fast food     Diet Counseling: to improve diet; diet is suboptimal     Does Patient Exercise: no  Problems Prior to Update: 1)  Acute Cystitis  (ICD-595.0) 2)  Sinusitis - Acute-nos  (ICD-461.9) 3)  Routine Gynecological Examination  (ICD-V72.31) 4)  Renal Insufficiency, Chronic  (ICD-585.9) 5)  Well Woman  (ICD-V70.0) 6)  Special Screening For Osteoporosis  (ICD-V82.81) 7)  Other Screening Mammogram  (ICD-V76.12) 8)  Hyperkalemia  (ICD-276.7) 9)   Hypercholesterolemia  (ICD-272.0) 10)  Umbilical Herniorrhaphy, Hx of  (ICD-V45.89) 11)  Prediabetes  (ICD-790.29) 12)  Allergic Rhinitis  (ICD-477.9) 13)  Depression  (ICD-311) 14)  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 Take 1 Tablet By Mouth Once A Day 2)  Proair Hfa 108 (90 Base) Mcg/act Aers (Albuterol Sulfate) .... As Needed 3)  Qvar 40 Mcg/act Aers (Beclomethasone Dipropionate) .... As Needed 4)  Vitamin D 1000 Unit  Tabs (Cholecalciferol) .... Take 1 Tablet By Mouth Once A Day 5)  Simvastatin 40 Mg Tabs (Simvastatin) .Marland Kitchen.. 1 Tab By Mouth Daily 6)  Amoxicillin 875 Mg Tabs (Amoxicillin) .Marland Kitchen.. 1 By Mouth Two Times A Day X10d 7)  Flonase 50 Mcg/act Susp (Fluticasone Propionate) .... 2 Sprays Per Nostril Per Day  Allergies (verified): No Known Drug Allergies  Past History:  Past medical, surgical, family and social histories (including risk factors) reviewed, and no changes noted (except as noted below).  Past Medical History: Reviewed history from 04/16/2008 and no changes required. Osteoarthritis, knees Depression Allergic rhinitis  Past Surgical History: Reviewed history from 04/16/2008 and no changes required. Cholecystectomy 03/23/08 Tonsillectomy  Family History: Reviewed history from 04/16/2008 and no changes required. father: prostate cancer mother: dementia brother:  DM sister; prediabetes NO CAD early age uncle: melanoma  Social History: Reviewed history from 04/16/2008 and no changes required. Retired Divorced Never Smoked Alcohol use-yes, occ once a month Current Smoker Drug use-no Regular exercise-no Diet:  moderate diet, good calcium intake  Physical Exam  General:  overweight female in NAD  Head:  no maxillary tp Eyes:  No corneal or conjunctival inflammation noted. EOMI. Perrla. Funduscopic exam benign, without hemorrhages,  exudates or papilledema. Vision grossly normal. Ears:  External ear exam shows no significant lesions or deformities.  Otoscopic examination reveals clear canals, tympanic membranes are intact bilaterally without bulging, retraction, inflammation or discharge. Hearing is grossly normal bilaterally. Nose:  nasla discharge, clear Mouth:  MMM Neck:  no carotid bruit or thyromegaly no cervical or supraclavicular lymphadenopathy  Lungs:  Normal respiratory effort, chest expands symmetrically. Lungs are clear to auscultation, no crackles or wheezes. Heart:  Normal rate and regular rhythm. S1 and S2 normal without gallop, murmur, click, rub or other extra sounds. Abdomen:  Bowel sounds positive,abdomen soft and non-tender without masses, organomegaly or hernias noted. Genitalia:  Pelvic Exam:        External: normal female genitalia without lesions or masses        Vagina: normal without lesions or masses        Cervix: not visualized        Adnexa: normal bimanual exam without masses or fullness        Uterus: normal by palpation        Pap smear: not performed Msk:  ttp in lower back,left parraspinous muscle, neg SLR Pulses:  R and L posterior tibial pulses are full and equal bilaterally  Extremities:  no edema Skin:  pink  .1 cm lesion left anterior calf... no flake, not raised... treat with hydrocortisone cream OTC x 2weeks.. if not improving consider derm referral. Psych:  Cognition and judgment appear intact. Alert and cooperative with normal attention span and concentration. No apparent delusions, illusions, hallucinations   Impression & Recommendations:  Problem # 1:  WELL WOMAN (ICD-V70.0) The patient's preventative maintenance and recommended screening tests for an annual wellness exam were reviewed in full today. Brought up to date unless services declined.  Counselled on the importance of diet, exercise, and its role in overall health and mortality. The patient's FH and SH was  reviewed, including their home life, tobacco status, and drug and alcohol status.     Problem # 2:  Gynecological examination-routine (ICD-V72.31) NO PAP this year, DVE only  Problem # 3:  HYPERCHOLESTEROLEMIA (ICD-272.0)  Well controlled. Continue current medication.  Her updated medication list for this problem includes:    Simvastatin 40 Mg Tabs (Simvastatin) .Marland Kitchen... 1 tab by mouth daily  Labs Reviewed: SGOT: 21 (05/03/2010)   SGPT: 19 (05/03/2010)  Lipid Goals: Chol Goal: 200 (08/20/2008)   HDL Goal: 40 (08/20/2008)   LDL Goal: 130 (08/20/2008)   TG Goal: 150 (08/20/2008)  10 Yr Risk Heart Disease: 9 % Prior 10 Yr Risk Heart Disease: 7 % (06/09/2009)   HDL:43.80 (05/03/2010), 45.50 (11/24/2009)  LDL:130 (05/03/2010), 120 (11/24/2009)  Chol:200 (05/03/2010), 186 (11/24/2009)  Trig:130.0 (05/03/2010), 102.0 (11/24/2009)  Problem # 4:  DEPRESSION (ICD-311) Well controlled. Continue current medication.  Her updated medication list for this problem includes:    Celexa 40 Mg Tabs (Citalopram hydrobromide) .Marland Kitchen... Take 1 tablet by mouth once a day  Problem # 5:  RENAL INSUFFICIENCY, CHRONIC (ICD-585.9) Stable last check.   Problem # 6:  Family Hx of HYPOTHYROIDISM (ICD-244.9)  Orders: TLB-TSH (Thyroid Stimulating Hormone) (84443-TSH)  Complete Medication List: 1)  Celexa 40 Mg Tabs (Citalopram hydrobromide) .... Take 1 tablet by mouth once a day 2)  Proair Hfa 108 (90 Base) Mcg/act Aers (Albuterol sulfate) .... As needed 3)  Qvar 40 Mcg/act Aers (Beclomethasone dipropionate) .... As needed 4)  Vitamin D 1000 Unit Tabs (Cholecalciferol) .... Take 1 tablet by mouth once a day 5)  Simvastatin 40 Mg Tabs (Simvastatin) .Marland Kitchen.. 1 tab by mouth daily 6)  Amoxicillin 875 Mg Tabs (Amoxicillin) .Marland Kitchen.. 1 by mouth two times a day x10d 7)  Flonase 50 Mcg/act Susp (Fluticasone propionate) .... 2 sprays per nostril per day  Lipid Assessment/Plan:      Based on NCEP/ATP III, the patient's risk factor  category is "2 or more risk factors and a calculated 10 year CAD risk of < 20%".  The patient's lipid goals are as follows: Total cholesterol goal is 200; LDL cholesterol goal is 130; HDL cholesterol goal is 40; Triglyceride goal is 150.  Her LDL cholesterol goal has been met.    Patient Instructions: 1)   Work on exercise and healthy eating.  2)  Please schedule a follow-up appointment in 1 year.  3)  Fasting lipids, CMET in 6 months. Dx 272.0 Prescriptions: CELEXA 40 MG TABS (CITALOPRAM HYDROBROMIDE) Take 1 tablet by mouth once a day  #90 x 3   Entered and Authorized by:   Kerby Nora MD   Signed by:   Kerby Nora MD on 06/24/2010   Method used:   Electronically to        [id number] (retail)       [address]       [place], [place]  [address]       Ph: [phone]       Fax: [phone]   RxID:   [id number] SIMVASTATIN 40 MG TABS (SIMVASTATIN) 1 tab by mouth daily  #90 x 3   Entered and Authorized by:   Kerby Nora MD   Signed by:   Kerby Nora MD on 06/24/2010   Method used:   Electronically to        [id number] (retail)       [address]       [place], [place]  [address]       Ph: [phone]       Fax: [phone]   RxID:   [id number]    Orders Added: 1)  TLB-TSH (Thyroid Stimulating Hormone) [84443-TSH] 2)  Est. Patient 40-64 years [[address]]     Past Medical History:    Reviewed history from 04/16/2008 and no changes required:       Osteoarthritis, knees       Depression       Allergic rhinitis  Past Surgical History:    Reviewed history from 04/16/2008 and no changes required:       Cholecystectomy 03/23/08       Tonsillectomy   Last Flu Vaccine:  Historical (01/30/2008 10:43:06 AM) Flu Vaccine Next Due:  Refused Herpes Zoster Next Due:  Refused Last PAP:  NEGATIVE FOR INTRAEPITHELIAL LESIONS OR MALIGNANCY. (06/09/2009 12:00:00 AM) PAP Result Date:  06/24/2010 PAP Result:  DVE nml, pap every 2-3 years PAP  Next Due:  1 yr Last Mammogram:  ASSESSMENT: Negative - BI-RADS 1^MM DIGITAL SCREENING (06/02/2009 12:31:00 PM) Mammogram Result Date:  06/22/2010 Mammogram Result:  normal Mammogram Next Due:  1 yr Bone density... nml in 2010 nonsmoker, no prednisone in past, MGM with osteoporosis, sister with  osteopenia, menses age 50, menopause age 34

## 2010-07-22 ENCOUNTER — Telehealth: Payer: Self-pay | Admitting: *Deleted

## 2010-07-22 ENCOUNTER — Encounter (INDEPENDENT_AMBULATORY_CARE_PROVIDER_SITE_OTHER): Payer: Self-pay | Admitting: *Deleted

## 2010-07-22 DIAGNOSIS — K529 Noninfective gastroenteritis and colitis, unspecified: Secondary | ICD-10-CM

## 2010-07-22 NOTE — Telephone Encounter (Signed)
Notify pt that.. Typically diverticulosis alone is not cause of diarrhea. HAving gallbladder out is very commonly a cause for stool consistency cahnge.  Given her significant symptoms... I agree with referral to GI.  [facility] referral to [facility].

## 2010-07-22 NOTE — Telephone Encounter (Signed)
Pt states she has a bout of uncontrollable diarrhea x one about once a week.  She is asking if this could be caused by diverticulosis and would like referral to GI in [place] for colonoscopy.  She has had her gall bladder out and asks if that could cause the diarrhea, someone told her both of these things could cause incontinence of bowels.

## 2010-07-25 NOTE — Telephone Encounter (Signed)
Patient advised via message on machine 

## 2010-07-28 NOTE — Letter (Signed)
Summary: New Patient letter  [facility]  [address] [place], [place] [address]   Phone: [phone]  Fax: [phone]       07/22/2010 MRN: [id number]  Annastyn Ferron [address] [place], [place]  [address]  [place]  Dear Ms. Wyles,  Welcome to the Gastroenterology Division at [facility].    You are scheduled to see Dr.  Claudette Head on Sep 01, 2010 at 10:15am on the 3rd floor at [facility], [address]  We ask that you try to arrive at our office 15 minutes prior to your appointment time to allow for check-in.  We would like you to complete the enclosed self-administered evaluation form prior to your visit and bring it with you on the day of your appointment.  We will review it with you.  Also, please bring a complete list of all your medications or, if you prefer, bring the medication bottles and we will list them.  Please bring your insurance card so that we may make a copy of it.  If your insurance requires a referral to see a specialist, please bring your referral form from your primary care physician.  Co-payments are due at the time of your visit and may be paid by cash, check or credit card.     Your office visit will consist of a consult with your physician (includes a physical exam), any laboratory testing he/she may order, scheduling of any necessary diagnostic testing (e.g. x-ray, ultrasound, CT-scan), and scheduling of a procedure (e.g. Endoscopy, Colonoscopy) if required.  Please allow enough time on your schedule to allow for any/all of these possibilities.    If you cannot keep your appointment, please call [phone] to cancel or reschedule prior to your appointment date.  This allows [place] the opportunity to schedule an appointment for another patient in need of care.  If you do not cancel or reschedule by 5 p.m. the business day prior to your appointment date, you will be charged a $50.00 late cancellation/no-show fee.    Thank you for  choosing [facility] for your medical needs.  We appreciate the opportunity to care for you.  Please visit [place] at our website  to learn more about our practice.                     Sincerely,                                                             The Gastroenterology Division

## 2010-07-30 ENCOUNTER — Other Ambulatory Visit: Payer: Self-pay | Admitting: Family Medicine

## 2010-09-01 ENCOUNTER — Encounter: Payer: Self-pay | Admitting: Gastroenterology

## 2010-09-01 ENCOUNTER — Other Ambulatory Visit (INDEPENDENT_AMBULATORY_CARE_PROVIDER_SITE_OTHER): Payer: Self-pay

## 2010-09-01 ENCOUNTER — Ambulatory Visit (INDEPENDENT_AMBULATORY_CARE_PROVIDER_SITE_OTHER): Payer: Self-pay | Admitting: Gastroenterology

## 2010-09-01 VITALS — BP 108/78 | HR 68 | Ht 63.75 in | Wt 190.0 lb

## 2010-09-01 DIAGNOSIS — R197 Diarrhea, unspecified: Secondary | ICD-10-CM

## 2010-09-01 LAB — COMPREHENSIVE METABOLIC PANEL
Albumin: 3.9 g/dL (ref 3.5–5.2)
CO2: 29 mEq/L (ref 19–32)
Calcium: 9.9 mg/dL (ref 8.4–10.5)
GFR: 51.01 mL/min — ABNORMAL LOW (ref >60.00)
Glucose, Bld: 96 mg/dL (ref 70–99)
Potassium: 5.3 mEq/L — ABNORMAL HIGH (ref 3.5–5.1)
Sodium: 140 mEq/L (ref 135–145)
Total Bilirubin: 0.5 mg/dL (ref 0.3–1.2)
Total Protein: 6.7 g/dL (ref 6.0–8.3)

## 2010-09-01 LAB — CBC
Hemoglobin: 14.5 g/dL (ref 12.0–15.0)
MCH: 29.1 pg (ref 26.0–34.0)
RBC: 4.99 MIL/uL (ref 3.87–5.11)

## 2010-09-01 MED ORDER — PEG-KCL-NACL-NASULF-NA ASC-C 100 G PO SOLR: 1.0000 | Freq: Once | ORAL | Status: AC

## 2010-09-01 NOTE — Patient Instructions (Addendum)
Go directly to the basement to have your labs drawn today. You have been scheduled for a Colonoscopy and separate instructions given. Pick up your prep from your pharmacy.  cc: Kerby Nora, MD

## 2010-09-01 NOTE — Progress Notes (Signed)
History of Present Illness: This is a 60 year old female here today for evaluation of intermittent diarrhea for the past 3 years. She underwent cholecystectomy in 2009 for chronic cholecystitis and cholelithiasis. Since that time has had about one episode per week of urgent, watery, nonbloody diarrhea. She has occasionally had incontinence when she is unable to get to the bathroom quickly. This generally occurs when she is driving. She notes no abdominal pain, nausea or vomiting associated with her diarrhea. In between these episodes she generally has one well-formed bowel movement a day. She drinks a significant amount of caffeine and colas and tea use on a daily basis. She notes no specific foods which precipitate her symptoms and denies any milk or fatty food intolerance. She has not use sugar-free gums or sodas. She has not associated symptoms with stress. She was previously evaluated by Dr. Loreta Ave and underwent upper endoscopy and colonoscopy in July 2004 for iron deficiency anemia. Both studies were normal. Duodenal biopsy was obtained for mild duodenal erythema and showed no abnormalities and normal villous architecture. She denies abdominal pain, chest pain, weight loss, melena, hematochezia, change in bowel habits and constipation. A recent TSH and transaminases were normal.  Past Medical History  Diagnosis Date  . Osteoarthritis   . Depression   . Allergic rhinitis   . Iron deficiency anemia   . Asthma    Past Surgical History  Procedure Date  . Cholecystectomy   . Tonsillectomy   . Bunionectomy     rt. foot  . Hernia repair     reports that she has quit smoking. She does not have any smokeless tobacco history on file. She reports that she drinks alcohol. She reports that she does not use illicit drugs. family history includes Dementia in her mother; Diabetes in her brother; Melanoma in an unspecified family member; and Prostate cancer in her father. No Known Allergies    Outpatient  Encounter Prescriptions as of 09/01/2010  Medication Sig Dispense Refill  . Cholecalciferol (VITAMIN D) 2000 UNITS CAPS Take 1 capsule by mouth daily.        . citalopram (CELEXA) 40 MG tablet Take 40 mg by mouth daily.        . simvastatin (ZOCOR) 40 MG tablet TAKE 1 TABLET BY MOUTH EVERY DAY  90 tablet  2  . peg 3350 powder (MOVIPREP) 100 G SOLR Take 1 kit (100 g total) by mouth once.  1 kit  0    Review of Systems: Allergies, arthritis, back pain, cough, depression. Pertinent positive and negative review of systems were noted in the above HPI section. All other review of systems were otherwise negative.   Physical Exam: General: Well developed , well nourished, no acute distress Head: Normocephalic and atraumatic Eyes:  sclerae anicteric, EOMI Ears: Normal auditory acuity Mouth: No deformity or lesions Neck: Supple, no masses or thyromegaly Lungs: Clear throughout to auscultation Heart: Regular rate and rhythm; no murmurs, rubs or bruits Abdomen: Soft, non tender and non distended. No masses, hepatosplenomegaly or hernias noted. Normal Bowel sounds Rectal: No lesions, normal sphincter tone, Hemoccult negative stool Musculoskeletal: Symmetrical with no gross deformities  Skin: No lesions on visible extremities Pulses:  Normal pulses noted Extremities: No clubbing, cyanosis, edema or deformities noted Neurological: Alert oriented x 4, grossly nonfocal Cervical Nodes:  No significant cervical adenopathy Inguinal Nodes: No significant inguinal adenopathy Psychological:  Alert and cooperative. Normal mood and affect  Assessment and Recommendations:  1. Intermittent diarrhea. Symptoms began following her cholecystectomy however  the intermittent nature of her diarrhea is not typical for postcholecystectomy diarrhea. Obtain routine blood work and a celiac panel. Minimize caffeine intake. Rule out inflammatory bowel disease, microscopic colitis and colorectal neoplasms. The risks, benefits,  and alternatives to colonoscopy with possible biopsy and possible polypectomy were discussed with the patient and they consent to proceed.

## 2010-09-02 ENCOUNTER — Telehealth: Payer: Self-pay

## 2010-09-02 DIAGNOSIS — E876 Hypokalemia: Secondary | ICD-10-CM

## 2010-09-02 LAB — GLIA (IGA/G) + TTG IGA: Gliadin IgA: 14.2 U/mL (ref <20)

## 2010-09-02 NOTE — Telephone Encounter (Signed)
Message copied by Christie Nottingham on Fri Sep 02, 2010  1:11 PM ------      Message from: Claudette Head      Created: Fri Sep 02, 2010  9:54 AM       K+ mildly increased-BMET early next week      Rest of labs normal with celiac panel pending

## 2010-09-07 ENCOUNTER — Encounter: Payer: Self-pay | Admitting: Gastroenterology

## 2010-09-08 ENCOUNTER — Ambulatory Visit (AMBULATORY_SURGERY_CENTER): Payer: Self-pay | Admitting: Gastroenterology

## 2010-09-08 ENCOUNTER — Encounter: Payer: Self-pay | Admitting: Gastroenterology

## 2010-09-08 ENCOUNTER — Other Ambulatory Visit (INDEPENDENT_AMBULATORY_CARE_PROVIDER_SITE_OTHER): Payer: Self-pay

## 2010-09-08 VITALS — BP 121/58 | HR 68 | Temp 98.1°F | Resp 16 | Ht 63.0 in | Wt 186.0 lb

## 2010-09-08 DIAGNOSIS — K6389 Other specified diseases of intestine: Secondary | ICD-10-CM

## 2010-09-08 DIAGNOSIS — R197 Diarrhea, unspecified: Secondary | ICD-10-CM

## 2010-09-08 DIAGNOSIS — E876 Hypokalemia: Secondary | ICD-10-CM

## 2010-09-08 DIAGNOSIS — D126 Benign neoplasm of colon, unspecified: Secondary | ICD-10-CM

## 2010-09-08 LAB — BASIC METABOLIC PANEL
Calcium: 10.1 mg/dL (ref 8.4–10.5)
GFR: 42.75 mL/min — ABNORMAL LOW (ref >60.00)
Sodium: 141 mEq/L (ref 135–145)

## 2010-09-08 MED ORDER — SODIUM CHLORIDE 0.9 % IV SOLN: 500.0000 mL | INTRAVENOUS | Status: DC

## 2010-09-08 NOTE — Patient Instructions (Signed)
Resume all medications. Imodium AD twice a day as needed for diarrhea. Information given on polyps.

## 2010-09-08 NOTE — Progress Notes (Signed)
Pt. Stated that she wanted to go to grandson program this afternoon at 6:00 p.m. Advised p.t. Against due to sedation in system.

## 2010-09-09 ENCOUNTER — Telehealth: Payer: Self-pay | Admitting: *Deleted

## 2010-09-09 NOTE — Telephone Encounter (Signed)

## 2010-09-12 ENCOUNTER — Other Ambulatory Visit (INDEPENDENT_AMBULATORY_CARE_PROVIDER_SITE_OTHER): Payer: Self-pay

## 2010-09-12 DIAGNOSIS — E876 Hypokalemia: Secondary | ICD-10-CM

## 2010-09-12 LAB — BASIC METABOLIC PANEL
CO2: 23 mEq/L (ref 19–32)
Calcium: 9.3 mg/dL (ref 8.4–10.5)
Chloride: 104 mEq/L (ref 96–112)
Creatinine, Ser: 1.2 mg/dL (ref 0.4–1.2)
Glucose, Bld: 125 mg/dL — ABNORMAL HIGH (ref 70–99)

## 2010-09-13 NOTE — Op Note (Signed)
[name], Johnston NO.:  [id number]   MEDICAL RECORD NO.:  [id number]          PATIENT TYPE:  OIB   LOCATION:  0098                         FACILITY:  [facility]   PHYSICIAN:  Anselm Pancoast. Weatherly, M.D.DATE OF BIRTH:  1950/09/19   DATE OF PROCEDURE:  03/23/2008  DATE OF DISCHARGE:                               OPERATIVE REPORT   PREOPERATIVE DIAGNOSIS:  Chronic cholecystitis with stones.   POSTOPERATIVE DIAGNOSIS:  Chronic cholecystitis with stones.   OPERATION:  Laparoscopic cholecystectomy with cholangiogram.   HISTORY:  Rebekah Johnston is a 60 year old Caucasian female referred to  [place] by Talmadge Coventry, M.D. for gallbladder problem.  Over the past  year she has been trying to diet, she has lost 12 pounds but then on a  recent physical examination by Dr. Smith Mince they noted her SGOT and  SGPT were slightly elevated.  They did an ultrasound of the gallbladder,  this was done, there was sludge within the gallbladder but no obvious  stone.  She had a CCK stimulation, half-and-half, and they could not  measure any contrast moving out of the gallbladder, so a definite  abnormal hepatobiliary scan.  I saw her and recommended that we proceed  on with a laparoscopic cholecystectomy and cholangiogram.  She has not  had any bad episodes of pain, I saw her approximately 4 weeks ago.   Preoperatively, repeat blood work is unremarkable.  She was given 3  grams the Unasyn.  She has got PAS stockings.  On physical examination,  there is a little defect at the umbilicus, I think it was a little  umbilical hernia and I will plan on kind of incorporating this in the  closure at the time of surgery.  The patient's abdomen was prepped with  Betadine surgical solution, draped in a sterile manner.  A small  incision was made below the umbilicus.  The skin was picked up from the  overlying fascia and you could see a little hernia sac right at the  navel that was opened.  I did  not open the fascia anymore, we just went  through the navel, put a pursestring of #0 Vicryl to hold in the CO2,  and Hasson cannula.  The gallbladder was very tense but not acutely  inflamed.  The upper 10 mL trocar was placed in the subxiphoid area and  the lateral 5 mm trocars placed by Dr. Lurene Shadow, the assistant.  The  gallbladder was retracted upward and out, with the peritoneum open  proximal we could encompass the cystic duct and also cystic artery and I  placed a clip on each.  I then opened the cystic duct just proximal to  the clip, put in a Cook catheter, held it in place with a clip and then  a cholangiogram obtained.  She has good prompt fill of extrahepatic  biliary system, good flow into the duodenum and no evidence of any  common duct stones.  The catheter was removed, the cystic duct was  triply clipped, divided.  In the cystic artery then I put an additional  clip proximal, single and distally and  divided the artery and then the  gallbladder was freed from its bed, first with a hook and then switching  to the spatula electrocautery.  The gallbladder was very tense and there  was a little bit of bile spillage, there was no stones that I could see  spilling out, and the gallbladder was placed in an EndoCatch bag.  We  thoroughly irrigated and aspirated and then switched the camera to the  upper 10 mL trocar and withdrew the bag containing the gallbladder.  There were no obvious stones that I could palpate in the gallbladder.  Next, I elected to close the fascia transversely with #0 Prolene, I did  not invert the knot but then after the fascia had been closed we sort of  freed the umbilicus off the overlying fascia above the superior part and  then I closed the subcutaneous transversely with #4-0 Vicryl.  I then  put Steri-Strips and Benzoin on the skin, with the upper 10 mL trocar I  put subcuticular #4-0 Vicryl and Benzoin and Steri-Strips.  We  anesthetized all the port  sites and then withdrew the 5 mL ports all  directly.  The patient tolerated  the procedure nicely and we will let her decide whether she goes home  this evening or in the morning.  She lives alone but her sister can stay  with her or she can go with her sister if she desires.  She may just  elect to stay tonight and go home in the morning.           ______________________________  Anselm Pancoast. Zachery Dakins, M.D.     WJW/MEDQ  D:  03/23/2008  T:  03/23/2008  Job:  [id number]   cc:   Talmadge Coventry, M.D.  Fax: [phone]   Anselm Pancoast. Zachery Dakins, M.D.  [address], [address]  [place]  [place] [address]

## 2010-09-16 NOTE — Op Note (Signed)
Rebekah Johnston, Rebekah Johnston                         ACCOUNT NO.:  [id number]   MEDICAL RECORD NO.:  [id number]                   PATIENT TYPE:  AMB   LOCATION:  [facility]                                 FACILITY:  [facility]   PHYSICIAN:  Mark C. Vernie Ammons, M.D.               DATE OF BIRTH:  1951-02-20   DATE OF PROCEDURE:  02/10/2002  DATE OF DISCHARGE:                                 OPERATIVE REPORT   PREOPERATIVE DIAGNOSES:  Cystocele and stress urinary incontinence.   POSTOPERATIVE DIAGNOSES:  Cystocele and stress urinary incontinence.   PROCEDURE:  Anterior repair and SPARC sling.   SURGEON:  Mark C. Vernie Ammons, M.D.   ANESTHESIA:  General.   ESTIMATED BLOOD LOSS:  400 cc   DRAINS:  None.   SPECIMENS:  None.   COMPLICATIONS:  None.   INDICATIONS FOR PROCEDURE:  The patient is a 60 year old white female whose  experienced progressive stress urinary incontinence over the past three or  four months but has actually had some stress incontinence for about a year.  She had been told she had a cystocele in the past. 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 We discussed the risks,  complications and alternatives to surgery and she has elected to proceed  with correction.   DESCRIPTION OF PROCEDURE:  After informed consent, the patient was brought  to the major OR, placed on the table, administered general LMA anesthesia  and then moved to the dorsal lithotomy position. Her genitalia, vagina and  lower abdomen were then sterilely prepped and draped and a 16 French Foley  catheter was inserted in the bladder after which 12 cc of 1% lidocaine with  epinephrine was used to infiltrate the subvaginal mucosa in the midline.  After allowing adequate time for epinephrine effect, a midline incision was  made in the vaginal mucosa and Allis clamps were placed on the edges. Using  a combination  of sharp and blunt technique, I dissected the vaginal mucosa  from the bladder and was able to dissect this back to good fascia in the  lateral aspects of the incision. I then used a 2-0 Vicryl suture in an  interrupted fashion to close the cystocele defect in the midline.   Next, suprapubic stab incisions were then made three fingerbreadths apart at  the superior border of the symphysis pubis and the bladder was drained. The  catheter was removed and the cystoscope sheath was placed in the bladder.  The urethra and bladder neck was pulled away from the side that I was  passing the trocar and I first passed the trocar on the right hand side down  to an area at the mid urethral level and out next to the urethra. This was  performed first on the right and left side. I had a little  difficulty on the  left hand side. It seemed as though there was some scar-like tissue that  prevented easy passage of the trocar but I was able to get this passed and  out through the mid urethral region on the left hand side. I then inserted  the 70 degree lens and the cystoscope and inspected the bladder and noted  reflux from both ureteral orifices indicated by the blue dye that was  administered in the form of indigo carmine IV. I noted no injury or  perforation to the bladder by the trocars. I then hooked the sling material  to the end of the trocar on each side and brought this up through the  abdominal incision. The sling material was placed at the mid urethral level  and a catheter was placed in the urethra and the cystoscope sheath beneath  the urethra and I pulled up on the sling so that it was in approximation but  with no tension. The plastic sheaths on the sling material were then removed  and the sling was noted to be in good position again with no tension. The  redundant sling material was incised at the level of the skin and the skin  was closed with Dermabond.   Attention was then redirected to  the vagina where the redundant vaginal  mucosa was then excised and the vaginal incision was then closed with a  running 2-0 Vicryl suture in the midline. Two inch iodoform vaginal packing  was then placed in the vagina. The bladder was drained and the patient was  awakened and taken to the recovery room in stable satisfactory condition.  She tolerated the procedure well with no intraoperative complications.   She will be given a prescription for Tylox #36 and Cipro XR 500 mg. She will  take three days worth of that and follow-up in my office in one week for  recheck.                                                Mark C. Vernie Ammons, M.D.    MCO/MEDQ  D:  02/10/2002  T:  02/10/2002  Job:  [id number]

## 2010-09-16 NOTE — Op Note (Signed)
   NAME:  Rebekah Johnston, Rebekah Johnston                         ACCOUNT NO.:  [id number]   MEDICAL RECORD NO.:  [id number]                   PATIENT TYPE:  AMB   LOCATION:  ENDO                                 FACILITY:  [facility]   PHYSICIAN:  Anselmo Rod, M.D.               DATE OF BIRTH:  1950/11/27   DATE OF PROCEDURE:  11/28/2002  DATE OF DISCHARGE:                                 OPERATIVE REPORT   PROCEDURE PERFORMED:  Screening colonoscopy.   ENDOSCOPIST:  Anselmo Rod, M.D.   INSTRUMENT USED:  Olympus video colonoscope.   INDICATIONS FOR PROCEDURE:  Iron-deficiency anemia in a 60 year old white  female, rule out colonic polyps, masses, etc.   PREPROCEDURE PREPARATION:  Informed consent was procured from the patient.  The patient fasted for eight hours prior to the procedure and prepped with a  bottle of magnesium citrate and a gallon of GoLYTELY the night prior to the  procedure.   PREPROCEDURE PHYSICAL:  Patient had stable vital signs.  Neck supple.  Chest  clear to auscultation.  S1 and S2 regular.  Abdomen soft with normal bowel  sounds.   DESCRIPTION OF THE PROCEDURE:  The patient was placed in the left lateral  decubitus position, sedated with Demerol and Versed for the EGD.  NO  additional sedation was used for the colonoscopy.  Once the patient was  adequately sedated and maintained on low flow oxygen and continuous cardiac  monitoring, the Olympus video colonoscope was advanced from the rectum to  the cecum without difficulty.  The patient had a fairly good prep.  The  appendiceal orifice and ileocecal valve were clearly visualized  photographed.  No masses, polyps, erosions, ulcerations, etc., were seen.  Retroflexion in the rectum revealed no abnormalities.   IMPRESSION:  Normal colonoscopy.   RECOMMENDATIONS:  1. Repeat colorectal cancer screening as recommended in the next five years     unless the patient develops any     abnormal symptoms in the interim.  2.  Outpatient followup in the next two weeks or earlier if need be.  3. Continue serial CBC and iron supplementation as needed.                                               Anselmo Rod, M.D.    JNM/MEDQ  D:  11/28/2002  T:  11/29/2002  Job:  [id number]   cc:   Talmadge Coventry, M.D.  [address], [address]  [place]  [place] [address]  Fax: [phone]

## 2010-09-16 NOTE — Op Note (Signed)
   NAME:  Rebekah Johnston, Rebekah Johnston                         ACCOUNT NO.:  [id number]   MEDICAL RECORD NO.:  [id number]                   PATIENT TYPE:  AMB   LOCATION:  ENDO                                 FACILITY:  [facility]   PHYSICIAN:  Anselmo Rod, M.D.               DATE OF BIRTH:  1950-06-05   DATE OF PROCEDURE:  11/28/2002  DATE OF DISCHARGE:                                 OPERATIVE REPORT   PROCEDURE:  Esophagogastroduodenoscopy with biopsies.   ENDOSCOPIST:  Anselmo Rod, M.D.   INSTRUMENT USED:  Olympus video panendoscope.   INDICATIONS FOR PROCEDURE:  Iron deficiency anemia in a 60 year old white  female, rule out peptic ulcer disease, esophagitis, gastritis, etc.   PREPROCEDURE PREPARATION:  Informed consent was obtained from the patient.  The patient was  fasted for 8 hours prior to  the procedure.   PREPROCEDURE PHYSICAL:  The patient had stable vital signs. Neck supple.  Chest clear to auscultation. S1, S2 regular, abdomen soft with normal bowel  sounds.   DESCRIPTION OF PROCEDURE:  The patient was placed in the left lateral  decubitus position and sedated with 50 mg of Demerol, 5 mg of Versed  intravenously. Once the patient was adequately sedated and maintained on low  flow oxygen and continuous cardiac monitoring, the Olympus video  panendoscope was advanced through the mouth piece over the tongue into the  esophagus under direct visualization.   The entire esophagus appeared normal with no evidence of rings, stricture,  masses, esophagitis or Barrett's mucosa. The scope was then advanced into  the stomach. The entire gastric mucosa appeared healthy. Retroflexion in the  high cardia revealed  no abnormalities. The proximal small bowel seemed  slightly erythematoid and this area was biopsied to rule out Spruill,  considering the patient's family history of iron deficiency anemia.   The scope was then withdrawn. The patient tolerated the procedure well  without  complications.   IMPRESSION:  Mild erythema of the duodenal mucosa (proximal small bowel).  Otherwise normal esophagogastroduodenoscopy. Biopsies done to rule out  Spruill.    RECOMMENDATIONS:  1. Await pathology results.  2. Proceed with the colonoscopy at this time.  3. Further  recommendations will be made after the colonoscopy has been     done.                                               Anselmo Rod, M.D.    JNM/MEDQ  D:  11/28/2002  T:  11/29/2002  Job:  [id number]   cc:   Talmadge Coventry, M.D.  [address]  [place]  [place] [address]  Fax: [phone]

## 2010-09-18 ENCOUNTER — Encounter: Payer: Self-pay | Admitting: Gastroenterology

## 2010-11-29 ENCOUNTER — Encounter: Payer: Self-pay | Admitting: Family Medicine

## 2010-11-29 ENCOUNTER — Ambulatory Visit (INDEPENDENT_AMBULATORY_CARE_PROVIDER_SITE_OTHER): Payer: Self-pay | Admitting: Family Medicine

## 2010-11-29 VITALS — BP 122/88 | HR 76 | Temp 98.3°F | Ht 63.75 in | Wt 192.1 lb

## 2010-11-29 DIAGNOSIS — J069 Acute upper respiratory infection, unspecified: Secondary | ICD-10-CM

## 2010-11-29 MED ORDER — AZITHROMYCIN 250 MG PO TABS: ORAL_TABLET | ORAL | Status: AC

## 2010-11-29 NOTE — Patient Instructions (Signed)
Use albuterol for any wheezing a needed. Restart nasal fluticasone for nasal symptoms. Start nasal saline irrigation 2-3 times a day. Mucinex DM for a mucolytic. If not turing the conner in next 3-4 days fill antibiotics.

## 2010-11-29 NOTE — Assessment & Plan Note (Signed)
Given length of symptoms, most likely viral, but given ashtma and smoking history, if not improving with symptomsatic care in next 3-4 days, fill antibitoics. See pt instructions for details. No clear asthma exac.

## 2010-11-29 NOTE — Progress Notes (Signed)
  Subjective:    Patient ID: Rebekah Johnston, female    DOB: 1950-09-21, 60 y.o.   MRN: [id number]  Cough This is a new problem. The current episode started in the past 7 days. The problem has been gradually worsening. The problem occurs every few minutes. The cough is productive of sputum (yellowish). Associated symptoms include chills, ear congestion, ear pain, a fever, nasal congestion and wheezing. Pertinent negatives include no chest pain or shortness of breath. Associated symptoms comments: Nausea, episode of emesis 2 days ago.. Nothing further.  subjective fever.  left ear pain. She has tried OTC cough suppressant for the symptoms. The treatment provided mild relief. Her past medical history is significant for asthma. former remote shortterm smoker      Review of Systems  Constitutional: Positive for fever and chills.  HENT: Positive for ear pain.   Respiratory: Positive for cough and wheezing. Negative for shortness of breath.   Cardiovascular: Negative for chest pain.  Gastrointestinal: Positive for diarrhea. Negative for abdominal pain, constipation and abdominal distention.  Genitourinary: Negative for dysuria.       Objective:   Physical Exam  Constitutional: Vital signs are normal. She appears well-developed and well-nourished. She is cooperative.  Non-toxic appearance. She does not appear ill. No distress.  HENT:  Head: Normocephalic.  Right Ear: Hearing, tympanic membrane, external ear and ear canal normal. Tympanic membrane is not erythematous, not retracted and not bulging.  Left Ear: Hearing, tympanic membrane, external ear and ear canal normal. Tympanic membrane is not erythematous, not retracted and not bulging.  Nose: Mucosal edema and rhinorrhea present. Right sinus exhibits no maxillary sinus tenderness and no frontal sinus tenderness. Left sinus exhibits no maxillary sinus tenderness and no frontal sinus tenderness.  Mouth/Throat: Uvula is midline, oropharynx is  clear and moist and mucous membranes are normal.  Eyes: Conjunctivae, EOM and lids are normal. Pupils are equal, round, and reactive to light. No foreign bodies found.  Neck: Trachea normal and normal range of motion. Neck supple. Carotid bruit is not present. No mass and no thyromegaly present.  Cardiovascular: Normal rate, regular rhythm, S1 normal, S2 normal, normal heart sounds, intact distal pulses and normal pulses.  Exam reveals no gallop and no friction rub.   No murmur heard. Pulmonary/Chest: Effort normal and breath sounds normal. Not tachypneic. No respiratory distress. She has no decreased breath sounds. She has no wheezes. She has no rhonchi. She has no rales.  Abdominal: Soft. Bowel sounds are normal.  Genitourinary: Vagina normal and uterus normal.  Neurological: She is alert.  Skin: Skin is warm, dry and intact. No rash noted.  Psychiatric: Her speech is normal and behavior is normal. Judgment normal. Her mood appears not anxious. Cognition and memory are normal. She does not exhibit a depressed mood.          Assessment & Plan:   No problem-specific assessment & plan notes found for this encounter.

## 2011-01-03 ENCOUNTER — Telehealth: Payer: Self-pay | Admitting: *Deleted

## 2011-01-03 NOTE — Telephone Encounter (Signed)
Pt called today stating that she has back pain, feels like a muscle spasm.  She says she has some flexeril and asks if ok to take, advised yes,ok to try.  Call back for appt if not better.

## 2011-01-31 LAB — CBC
Hemoglobin: 14.5
MCHC: 33.4
MCV: 87.8
RBC: 4.94
RDW: 13.2

## 2011-01-31 LAB — DIFFERENTIAL
Eosinophils Absolute: 0.1
Eosinophils Relative: 1
Lymphocytes Relative: 34
Lymphs Abs: 3.8
Monocytes Relative: 6

## 2011-01-31 LAB — COMPREHENSIVE METABOLIC PANEL
Alkaline Phosphatase: 91
BUN: 15
CO2: 25
Chloride: 107
Creatinine, Ser: 1.25 — ABNORMAL HIGH
Glucose, Bld: 103 — ABNORMAL HIGH
Potassium: 4.4

## 2011-02-06 NOTE — Telephone Encounter (Signed)
Dr. Ermalene Searing, please sign off on this note, thank you.

## 2011-02-06 NOTE — Telephone Encounter (Signed)
Noted  

## 2011-07-03 ENCOUNTER — Ambulatory Visit (INDEPENDENT_AMBULATORY_CARE_PROVIDER_SITE_OTHER): Payer: Self-pay | Admitting: Family Medicine

## 2011-07-03 ENCOUNTER — Encounter: Payer: Self-pay | Admitting: Family Medicine

## 2011-07-03 DIAGNOSIS — J069 Acute upper respiratory infection, unspecified: Secondary | ICD-10-CM

## 2011-07-03 MED ORDER — AZITHROMYCIN 250 MG PO TABS: ORAL_TABLET | ORAL | Status: AC

## 2011-07-03 MED ORDER — BECLOMETHASONE DIPROPIONATE 80 MCG/ACT IN AERS: 1.0000 | INHALATION_SPRAY | Freq: Two times a day (BID) | RESPIRATORY_TRACT | Status: DC

## 2011-07-03 NOTE — Progress Notes (Signed)
  Patient Name: Rebekah Johnston Date of Birth: 18-Feb-1951 Age: 61 y.o. Medical Record Number: [id number] Gender: female Date of Encounter: 07/03/2011  History of Present Illness:  Rebekah Johnston is a 61 y.o. very pleasant female patient who presents with the following:  Started with a scratchy throat over the weekend. Has had some cough and over the day today, has not really been feeling well. Ear has been hurting earlier.   No smoker.   She does have some mild asthma and reportedly had PFT's that showed some mild obstruction as well. She did well on qvar previously  Past Medical History, Surgical History, Social History, Family History, Problem List, Medications, and Allergies have been reviewed and updated if relevant.  Review of Systems: ROS: GEN: Acute illness details above GI: Tolerating PO intake GU: maintaining adequate hydration and urination Pulm: No SOB Interactive and getting along well at home.  Otherwise, ROS is as per the HPI.   Physical Examination: Filed Vitals:   07/03/11 1157  BP: 130/82  Pulse: 81  Temp: 98.8 F (37.1 C)  TempSrc: Oral  Height: 5' 3.75" (1.619 m)  Weight: 197 lb (89.359 kg)  SpO2: 99%    Body mass index is 34.08 kg/(m^2).   Gen: WDWN, NAD; A & O x3, cooperative. Pleasant.Globally Non-toxic HEENT: Normocephalic and atraumatic. Throat clear, w/o exudate, R TM clear, L TM - good landmarks, No fluid present. rhinnorhea.  MMM Frontal sinuses: NT Max sinuses: NT NECK: Anterior cervical  LAD is absent CV: RRR, No M/G/R, cap refill <2 sec PULM: Breathing comfortably in no respiratory distress. no wheezing, crackles, rhonchi EXT: No c/c/e PSYCH: Friendly, good eye contact MSK: Nml gait    Assessment and Plan:  URI I discussed upper respiratory tract infections with the patient and explained viral infections in general. Asthma. If symptomatic through the weekend, ok to fill abx  Recommended plenty of sleep. Symptomatic care  with pushing fluids. Oral acetaminophen or NSAIDs as tolerated for body aches, chills, fevers.  follow-up if acutely worsens  Fill qvar

## 2011-07-10 ENCOUNTER — Telehealth: Payer: Self-pay | Admitting: Family Medicine

## 2011-07-10 NOTE — Telephone Encounter (Signed)
Patient called the office asking to speak with Dr. Patsy Lager in regards to her cough. She was here last Monday and was prescribed a Z pack. She finished it on Saturday but continues to have cough. She is very concerned and wants to know if it's contagious. Best number to reach to patient is [phone].jb

## 2011-07-10 NOTE — Telephone Encounter (Signed)
Patient advised.

## 2011-07-10 NOTE — Telephone Encounter (Signed)
Very common to have a cough that can last for up to 2 weeks after an acute illness  I would not cough directly in someone's face, or kiss, but should be able to be around people

## 2011-08-18 ENCOUNTER — Emergency Department (HOSPITAL_COMMUNITY)
Admission: EM | Admit: 2011-08-18 | Discharge: 2011-08-19 | Disposition: A | Discharge status: Home / Self Care | Payer: Self-pay | Attending: Emergency Medicine | Admitting: Emergency Medicine

## 2011-08-18 ENCOUNTER — Encounter (HOSPITAL_COMMUNITY): Payer: Self-pay | Admitting: Emergency Medicine

## 2011-08-18 ENCOUNTER — Emergency Department (HOSPITAL_COMMUNITY): Payer: Self-pay

## 2011-08-18 DIAGNOSIS — R109 Unspecified abdominal pain: Secondary | ICD-10-CM | POA: Insufficient documentation

## 2011-08-18 DIAGNOSIS — K449 Diaphragmatic hernia without obstruction or gangrene: Secondary | ICD-10-CM | POA: Insufficient documentation

## 2011-08-18 DIAGNOSIS — J45909 Unspecified asthma, uncomplicated: Secondary | ICD-10-CM | POA: Insufficient documentation

## 2011-08-18 DIAGNOSIS — R112 Nausea with vomiting, unspecified: Secondary | ICD-10-CM | POA: Insufficient documentation

## 2011-08-18 DIAGNOSIS — K859 Acute pancreatitis without necrosis or infection, unspecified: Secondary | ICD-10-CM | POA: Insufficient documentation

## 2011-08-18 DIAGNOSIS — R7402 Elevation of levels of lactic acid dehydrogenase (LDH): Secondary | ICD-10-CM | POA: Insufficient documentation

## 2011-08-18 DIAGNOSIS — R748 Abnormal levels of other serum enzymes: Secondary | ICD-10-CM

## 2011-08-18 DIAGNOSIS — R7401 Elevation of levels of liver transaminase levels: Secondary | ICD-10-CM | POA: Insufficient documentation

## 2011-08-18 LAB — CBC
HCT: 41.9 % (ref 36.0–46.0)
MCH: 29.2 pg (ref 26.0–34.0)
MCV: 85.7 fL (ref 78.0–100.0)
Platelets: 285 10*3/uL (ref 150–400)
RDW: 12.9 % (ref 11.5–15.5)

## 2011-08-18 LAB — COMPREHENSIVE METABOLIC PANEL
ALT: 46 U/L — ABNORMAL HIGH (ref 0–35)
Calcium: 9.4 mg/dL (ref 8.4–10.5)
Creatinine, Ser: 1.25 mg/dL — ABNORMAL HIGH (ref 0.50–1.10)
GFR calc Af Amer: 53 mL/min — ABNORMAL LOW (ref >90)
GFR calc non Af Amer: 45 mL/min — ABNORMAL LOW (ref >90)
Glucose, Bld: 112 mg/dL — ABNORMAL HIGH (ref 70–99)
Sodium: 134 mEq/L — ABNORMAL LOW (ref 135–145)
Total Protein: 6.8 g/dL (ref 6.0–8.3)

## 2011-08-18 LAB — DIFFERENTIAL
Basophils Absolute: 0 10*3/uL (ref 0.0–0.1)
Eosinophils Absolute: 0 10*3/uL (ref 0.0–0.7)
Eosinophils Relative: 0 % (ref 0–5)
Lymphs Abs: 1.5 10*3/uL (ref 0.7–4.0)
Monocytes Absolute: 0.7 10*3/uL (ref 0.1–1.0)

## 2011-08-18 MED ORDER — ONDANSETRON HCL 4 MG/2ML IJ SOLN
4.0000 mg | Freq: Once | INTRAMUSCULAR | Status: AC
  Administered 2011-08-18: 4 mg via INTRAVENOUS
  Filled 2011-08-18: qty 2

## 2011-08-18 MED ORDER — PANTOPRAZOLE SODIUM 40 MG IV SOLR
40.0000 mg | Freq: Once | INTRAVENOUS | Status: AC
  Administered 2011-08-18: 40 mg via INTRAVENOUS
  Filled 2011-08-18: qty 40

## 2011-08-18 MED ORDER — ONDANSETRON HCL 4 MG/2ML IJ SOLN
INTRAMUSCULAR | Status: AC
  Administered 2011-08-18: 4 mg
  Filled 2011-08-18: qty 2

## 2011-08-18 MED ORDER — SODIUM CHLORIDE 0.9 % IV BOLUS (SEPSIS)
1000.0000 mL | Freq: Once | INTRAVENOUS | Status: AC
  Administered 2011-08-18: 1000 mL via INTRAVENOUS

## 2011-08-18 NOTE — ED Notes (Signed)
Pt not able to urinate.   

## 2011-08-18 NOTE — ED Notes (Signed)
Per EMS, pt. Is from home who has been vomiting since last night and had episodes whole day today. Complained of heart burn and still nauseous. Denies of SOB, dizziness but claimed of feeling weak.

## 2011-08-18 NOTE — ED Notes (Signed)
BJY:[id number]<FA> Expected date:<BR> Expected time: 7:42 PM<BR> Means of arrival:<BR> Comments:<BR> M251 - 60yoF upper abd pain, nvd

## 2011-08-18 NOTE — ED Provider Notes (Signed)
History     CSN: [id number]  Arrival date & time 08/18/11  1947   First MD Initiated Contact with Patient 08/18/11 2032      Chief Complaint  Patient presents with  . Nausea  . Emesis  . Abdominal Pain  . Gastrophageal Reflux    (Consider location/radiation/quality/duration/timing/severity/associated sxs/prior treatment) HPI Comments: Pt with n/v/d since yesterday.  Has had some pain to upper abdomen.  Non-bilious/non-bloody emesis.  No fevers.  +family members with same symptoms last week.  Has hx of cholecystectomy in 2009.  No hx of pancreatitis.  Says that vomiting is better.  Still with diarrhea and feels weak all over.  Patient is a 61 y.o. female presenting with vomiting. The history is provided by the patient.  Emesis  This is a new problem. The current episode started yesterday. The problem occurs 5 to 10 times per day. The problem has not changed since onset.There has been no fever. Associated symptoms include abdominal pain and diarrhea. Pertinent negatives include no arthralgias, no chills, no cough, no fever and no headaches.    Past Medical History  Diagnosis Date  . Osteoarthritis   . Depression   . Allergic rhinitis   . Iron deficiency anemia   . Asthma     Past Surgical History  Procedure Date  . Cholecystectomy   . Tonsillectomy   . Bunionectomy     rt. foot  . Hernia repair     Family History  Problem Relation Age of Onset  . Prostate cancer Father   . Dementia Mother   . Diabetes Brother   . Melanoma      Uncle    History  Substance Use Topics  . Smoking status: Former Games developer  . Smokeless tobacco: Not on file   Comment: smoked socially not everyday thing.  . Alcohol Use: 0.6 oz/week    1 Glasses of wine per week     occasional once a month    OB History    Grav Para Term Preterm Abortions TAB SAB Ect Mult Living                  Review of Systems  Constitutional: Positive for fatigue. Negative for fever, chills and diaphoresis.    HENT: Negative for congestion, rhinorrhea and sneezing.   Eyes: Negative.   Respiratory: Negative for cough, chest tightness and shortness of breath.   Cardiovascular: Negative for chest pain and leg swelling.  Gastrointestinal: Positive for nausea, vomiting, abdominal pain and diarrhea. Negative for blood in stool.  Genitourinary: Negative for frequency, hematuria, flank pain and difficulty urinating.  Musculoskeletal: Negative for back pain and arthralgias.  Skin: Negative for rash.  Neurological: Negative for dizziness, speech difficulty, weakness, numbness and headaches.    Allergies  Review of patient's allergies indicates no known allergies.  Home Medications   Current Outpatient Rx  Name Route Sig Dispense Refill  . ACETAMINOPHEN 500 MG PO TABS Oral Take 500 mg by mouth every 6 (six) hours as needed. For pain relief    . BECLOMETHASONE DIPROPIONATE 80 MCG/ACT IN AERS Inhalation Inhale 1 puff into the lungs 2 (two) times daily. 1 Inhaler 12  . CITALOPRAM HYDROBROMIDE 40 MG PO TABS Oral Take 40 mg by mouth daily.      . SENNA 8.6 MG PO TABS Oral Take 1 tablet by mouth at bedtime.    Marland Kitchen ONDANSETRON HCL 4 MG PO TABS Oral Take 1 tablet (4 mg total) by mouth every 6 (six) hours.  12 tablet 0    BP 101/76  Pulse 88  Temp(Src) 98.8 F (37.1 C) (Oral)  Resp 16  SpO2 98%  Physical Exam  Constitutional: She is oriented to person, place, and time. She appears well-developed and well-nourished.  HENT:  Head: Normocephalic and atraumatic.  Eyes: Pupils are equal, round, and reactive to light.  Neck: Normal range of motion. Neck supple.  Cardiovascular: Normal rate, regular rhythm and normal heart sounds.   Pulmonary/Chest: Effort normal and breath sounds normal. No respiratory distress. She has no wheezes. She has no rales. She exhibits no tenderness.  Abdominal: Soft. Bowel sounds are normal. There is tenderness. There is no rebound and no guarding.       Mild tenderness across  upper abdomen  Musculoskeletal: Normal range of motion. She exhibits no edema.  Lymphadenopathy:    She has no cervical adenopathy.  Neurological: She is alert and oriented to person, place, and time.  Skin: Skin is warm and dry. No rash noted.  Psychiatric: She has a normal mood and affect.    ED Course  Procedures (including critical care time)  Results for orders placed during the hospital encounter of 08/18/11  CBC      Component Value Range   WBC 8.9  4.0 - 10.5 (K/uL)   RBC 4.89  3.87 - 5.11 (MIL/uL)   Hemoglobin 14.3  12.0 - 15.0 (g/dL)   HCT 03.4  74.2 - 59.5 (%)   MCV 85.7  78.0 - 100.0 (fL)   MCH 29.2  26.0 - 34.0 (pg)   MCHC 34.1  30.0 - 36.0 (g/dL)   RDW 63.8  75.6 - 43.3 (%)   Platelets 285  150 - 400 (K/uL)  DIFFERENTIAL      Component Value Range   Neutrophils Relative 74  43 - 77 (%)   Neutro Abs 6.6  1.7 - 7.7 (K/uL)   Lymphocytes Relative 17  12 - 46 (%)   Lymphs Abs 1.5  0.7 - 4.0 (K/uL)   Monocytes Relative 8  3 - 12 (%)   Monocytes Absolute 0.7  0.1 - 1.0 (K/uL)   Eosinophils Relative 0  0 - 5 (%)   Eosinophils Absolute 0.0  0.0 - 0.7 (K/uL)   Basophils Relative 0  0 - 1 (%)   Basophils Absolute 0.0  0.0 - 0.1 (K/uL)  COMPREHENSIVE METABOLIC PANEL      Component Value Range   Sodium 134 (*) 135 - 145 (mEq/L)   Potassium 3.9  3.5 - 5.1 (mEq/L)   Chloride 100  96 - 112 (mEq/L)   CO2 24  19 - 32 (mEq/L)   Glucose, Bld 112 (*) 70 - 99 (mg/dL)   BUN 15  6 - 23 (mg/dL)   Creatinine, Ser 2.95 (*) 0.50 - 1.10 (mg/dL)   Calcium 9.4  8.4 - 18.8 (mg/dL)   Total Protein 6.8  6.0 - 8.3 (g/dL)   Albumin 3.6  3.5 - 5.2 (g/dL)   AST 85 (*) 0 - 37 (U/L)   ALT 46 (*) 0 - 35 (U/L)   Alkaline Phosphatase 100  39 - 117 (U/L)   Total Bilirubin 1.5 (*) 0.3 - 1.2 (mg/dL)   GFR calc non Af Amer 45 (*) >90 (mL/min)   GFR calc Af Amer 53 (*) >90 (mL/min)  LIPASE, BLOOD      Component Value Range   Lipase 201 (*) 11 - 59 (U/L)  URINALYSIS, ROUTINE W REFLEX  MICROSCOPIC  Component Value Range   Color, Urine YELLOW  YELLOW    APPearance CLEAR  CLEAR    Specific Gravity, Urine 1.010  1.005 - 1.030    pH 6.5  5.0 - 8.0    Glucose, UA NEGATIVE  NEGATIVE (mg/dL)   Hgb urine dipstick NEGATIVE  NEGATIVE    Bilirubin Urine NEGATIVE  NEGATIVE    Ketones, ur NEGATIVE  NEGATIVE (mg/dL)   Protein, ur NEGATIVE  NEGATIVE (mg/dL)   Urobilinogen, UA 0.2  0.0 - 1.0 (mg/dL)   Nitrite NEGATIVE  NEGATIVE    Leukocytes, UA SMALL (*) NEGATIVE   URINE MICROSCOPIC-ADD ON      Component Value Range   Squamous Epithelial / LPF MANY (*) RARE    WBC, UA 11-20  <3 (WBC/hpf)   Bacteria, UA RARE  RARE    Ct Abdomen Pelvis W Contrast  08/19/2011  *RADIOLOGY REPORT*  Clinical Data: Vomiting since last night.  Heartburn and nausea. Elevated lipase and liver enzymes.  Upper abdominal pain.  CT ABDOMEN AND PELVIS WITH CONTRAST  Technique:  Multidetector CT imaging of the abdomen and pelvis was performed following the standard protocol during bolus administration of intravenous contrast.  Contrast: OMNIPAQUE IOHEXOL 300 MG/ML  SOLN  Comparison: None.  Findings: Atelectasis in the lung bases.  Small esophageal hiatal hernia.  Surgical absence of the gallbladder.  Mild bile duct dilatation which is likely physiologic in the postoperative patient.    The liver and spleen are otherwise unremarkable.  The pancreas demonstrates a vague low attenuation change in the region of the head of the pancreas without specific nodularity.  This could represent fatty infiltration or edema.  Changes may represent early pancreatitis.  Follow-up is recommended to exclude underlying mass lesion.  The stomach and small bowel are decompressed.  No adrenal gland nodules.  The kidneys are unremarkable.  No free air or free fluid in the abdomen. Calcification of abdominal aorta without aneurysm.  Pelvis:  The uterus and adnexal structures are not enlarged. Bladder wall is not thickened.  No free or  loculated pelvic fluid collection.  Pelvic lymph nodes are not enlarged.  Postoperative changes in the left groin consistent with hernia repair.  Stool filled colon without distension or inflammatory change.  The appendix is normal.  Normal alignment of the lumbar vertebrae.  IMPRESSION: Suggestion of early inflammatory process or fatty infiltration in the head of the pancreas.  90-month follow-up is recommended to exclude underlying mass lesion.  Mild bile duct dilatation is likely physiologic after cholecystectomy.  Small esophageal hiatal hernia.  Original Report Authenticated By: Marlon Pel, M.D.      Ct Abdomen Pelvis W Contrast  08/19/2011  *RADIOLOGY REPORT*  Clinical Data: Vomiting since last night.  Heartburn and nausea. Elevated lipase and liver enzymes.  Upper abdominal pain.  CT ABDOMEN AND PELVIS WITH CONTRAST  Technique:  Multidetector CT imaging of the abdomen and pelvis was performed following the standard protocol during bolus administration of intravenous contrast.  Contrast: OMNIPAQUE IOHEXOL 300 MG/ML  SOLN  Comparison: None.  Findings: Atelectasis in the lung bases.  Small esophageal hiatal hernia.  Surgical absence of the gallbladder.  Mild bile duct dilatation which is likely physiologic in the postoperative patient.    The liver and spleen are otherwise unremarkable.  The pancreas demonstrates a vague low attenuation change in the region of the head of the pancreas without specific nodularity.  This could represent fatty infiltration or edema.  Changes may represent  early pancreatitis.  Follow-up is recommended to exclude underlying mass lesion.  The stomach and small bowel are decompressed.  No adrenal gland nodules.  The kidneys are unremarkable.  No free air or free fluid in the abdomen. Calcification of abdominal aorta without aneurysm.  Pelvis:  The uterus and adnexal structures are not enlarged. Bladder wall is not thickened.  No free or loculated pelvic fluid  collection.  Pelvic lymph nodes are not enlarged.  Postoperative changes in the left groin consistent with hernia repair.  Stool filled colon without distension or inflammatory change.  The appendix is normal.  Normal alignment of the lumbar vertebrae.  IMPRESSION: Suggestion of early inflammatory process or fatty infiltration in the head of the pancreas.  59-month follow-up is recommended to exclude underlying mass lesion.  Mild bile duct dilatation is likely physiologic after cholecystectomy.  Small esophageal hiatal hernia.  Original Report Authenticated By: Marlon Pel, M.D.     1. Pancreatitis   2. Elevated liver enzymes       MDM  Pt with n/v/d.  Mild pancreatitis and elevation of liver enzymes.  No evidence of CBD stone.  Pt feels much better after fluids/zofran.  No pain to abdomen.  Discussed admission, but pt prefers to try outpt tx which I feel is appropriate.  Advised clear liguid diet through the weekend.  Advised will need to f/u with her PMD to have liver enzymes rechecked and advised will need f/u on the abnormality at the head of her pancreas.        Rolan Bucco, MD 08/19/11 [id number]

## 2011-08-19 LAB — URINALYSIS, ROUTINE W REFLEX MICROSCOPIC
Hgb urine dipstick: NEGATIVE
Nitrite: NEGATIVE
Protein, ur: NEGATIVE mg/dL
Specific Gravity, Urine: 1.01 (ref 1.005–1.030)
Urobilinogen, UA: 0.2 mg/dL (ref 0.0–1.0)

## 2011-08-19 LAB — URINE MICROSCOPIC-ADD ON

## 2011-08-19 MED ORDER — ONDANSETRON HCL 4 MG PO TABS: 4.0000 mg | ORAL_TABLET | Freq: Four times a day (QID) | ORAL | Status: DC

## 2011-08-19 MED ORDER — IOHEXOL 300 MG/ML  SOLN
100.0000 mL | Freq: Once | INTRAMUSCULAR | Status: AC | PRN
  Administered 2011-08-19: 100 mL via INTRAVENOUS

## 2011-08-19 NOTE — Discharge Instructions (Signed)
Acute Pancreatitis The pancreas is a large gland located behind your stomach. It produces (secretes) enzymes. These enzymes help digest food. It also releases the hormones glucagon and insulin. These hormones help regulate blood sugar. When the pancreas becomes inflamed, the disease is called pancreatitis. Inflammation of the pancreas occurs when enzymes from the pancreas begin attacking and digesting the pancreas. CAUSES  Most cases ofsudden onset (acute) pancreatitis are caused by:  Alcohol abuse.   Gallstones.  Other less common causes are:  Some medications.   Exposure to certain chemicals   Infection.   Damage caused by an accident (trauma).   Surgery of the belly (abdomen).  SYMPTOMS  Acute pancreatitis usually begins with pain in the upper abdomen and may radiate to the back. This pain may last a couple days. The constant pain varies from mild to severe. The acute form of this disease may vary from mild, nonspecific abdominal pain to profound shock with coma. About 1 in 5 cases are severe. These patients become dehydrated and develop low blood pressure. In severe cases, bleeding into the pancreas can lead to shock and death. The lungs, heart, and kidneys may fail. DIAGNOSIS  Your caregiver will form a clinical opinion after giving you an exam. Laboratory work is used to confirm this diagnosis. Often,a digestive enzyme from the pancreas (serum amylase) and other enzymes are elevated. Sugars and fats (lipids) in the blood may be elevated. There may also be changes in the following levels: calcium, magnesium, potassium, chloride and bicarbonate (chemicals in the blood). X-rays, a CT scan, or ultrasound of your abdomen may be necessary to search for other causes of your abdominal pain. TREATMENT  Most pancreatitis requires treatment of symptoms. Most acute attacks last a couple of days. Your caregiver can discuss the treatment options with you.  If complications occur, hospitalization  may be necessary for pain control and intravenous (IV) fluid replacement.   Sometimes, a tube may be put into the stomach to control vomiting.   Food may not be allowed for 3 to 4 days. This gives the pancreas time to rest. Giving the pancreas a rest means there is no stimulation that would produce more enzymes and cause more damage.   Medicines (antibiotics) that kill germs may be given if infection is the cause.   Sometimes, surgery may be required.   Following an acute attack, your caregiver will determine the cause, if possible, and offer suggestions to prevent recurrences.  HOME CARE INSTRUCTIONS   Eat smaller, more frequent meals. This reduces the amount of digestive juices the pancreas produces.   Decrease the amount of fat in your diet. This may help reduce loose, diarrheal stools.   Drink enough water and fluids to keep your urine clear or pale yellow. This is to avoid dehydration which can cause increased pain.   Talk to your caregiver about pain relievers or other medicines that may help.   Avoid anything that may have triggered your pancreatitis (for example, alcohol).   Follow the diet advised by your caregiver. Do not advance the diet too soon.   Take medicines as prescribed.   Get plenty of rest.   Check your blood sugar at home as directed by your caregiver.   If your caregiver has given you a follow-up appointment, it is very important to keep that appointment. Not keeping the appointment could result in a lasting (chronic) or permanent injury, pain, and disability. If there is any problem keeping the appointment, you must call to reschedule.    SEEK MEDICAL CARE IF:   You are not recovering in the time described by your caregiver.   You have persistent pain, weakness, or feel sick to your stomach (nauseous).   You have recovered and then have another bout of pain.  SEEK IMMEDIATE MEDICAL CARE IF:   You are unable to eat or keep fluids down.   Your pain  increases a lot or changes.   You have an oral temperature above 102 F (38.9 C), not controlled by medicine.   Your skin or the white part of your eyes look yellow (jaundice).   You develop vomiting.   You feel dizzy or faint.   Your blood sugar is high (over 300).  MAKE SURE YOU:   Understand these instructions.   Will watch your condition.   Will get help right away if you are not doing well or get worse.  Document Released: 04/17/2005 Document Revised: 04/06/2011 Document Reviewed: 11/29/2007 [employer] Patient Information 2012 [employer]. 

## 2011-08-23 ENCOUNTER — Ambulatory Visit (INDEPENDENT_AMBULATORY_CARE_PROVIDER_SITE_OTHER): Payer: Self-pay | Admitting: Family Medicine

## 2011-08-23 ENCOUNTER — Encounter: Payer: Self-pay | Admitting: Family Medicine

## 2011-08-23 VITALS — BP 120/84 | HR 59 | Temp 98.4°F | Ht 63.75 in | Wt 192.8 lb

## 2011-08-23 DIAGNOSIS — R935 Abnormal findings on diagnostic imaging of other abdominal regions, including retroperitoneum: Secondary | ICD-10-CM

## 2011-08-23 DIAGNOSIS — R7989 Other specified abnormal findings of blood chemistry: Secondary | ICD-10-CM

## 2011-08-23 DIAGNOSIS — K859 Acute pancreatitis without necrosis or infection, unspecified: Secondary | ICD-10-CM

## 2011-08-23 LAB — HEPATIC FUNCTION PANEL
ALT: 34 U/L (ref 0–35)
Albumin: 3.7 g/dL (ref 3.5–5.2)
Total Protein: 6.6 g/dL (ref 6.0–8.3)

## 2011-08-23 LAB — LIPASE: Lipase: 30 U/L (ref 11.0–59.0)

## 2011-08-23 NOTE — Progress Notes (Signed)
Patient Name: Rebekah Johnston Date of Birth: 07-12-1950 Age: 61 y.o. Medical Record Number: [id number] Gender: female Date of Encounter: 08/23/2011  History of Present Illness:  Rebekah Johnston is a 61 y.o. very pleasant female patient who presents with the following:  Started Thursday evening with stomach bug, then started to get some stomach pain. Also had some crushed ice with gatorade.   FN last week, the patient had some gastroenteritis, then had some worsening abdominal pain, went to the emergency room at [facility]. Ultimately she was found to have an elevated lipase and an elevated liver function test. She was given some IV fluids, and ultimately her symptoms have resolved, and she was well now. She is here for a recheck and to recheck her lipase and LFTs to ensure that they have become normal again.  Ct Abdomen Pelvis W Contrast  08/19/2011  *RADIOLOGY REPORT*  Clinical Data: Vomiting since last night.  Heartburn and nausea. Elevated lipase and liver enzymes.  Upper abdominal pain.  CT ABDOMEN AND PELVIS WITH CONTRAST  Technique:  Multidetector CT imaging of the abdomen and pelvis was performed following the standard protocol during bolus administration of intravenous contrast.  Contrast: OMNIPAQUE IOHEXOL 300 MG/ML  SOLN  Comparison: None.  Findings: Atelectasis in the lung bases.  Small esophageal hiatal hernia.  Surgical absence of the gallbladder.  Mild bile duct dilatation which is likely physiologic in the postoperative patient.    The liver and spleen are otherwise unremarkable.  The pancreas demonstrates a vague low attenuation change in the region of the head of the pancreas without specific nodularity.  This could represent fatty infiltration or edema.  Changes may represent early pancreatitis.  Follow-up is recommended to exclude underlying mass lesion.  The stomach and small bowel are decompressed.  No adrenal gland nodules.  The kidneys are unremarkable.  No  free air or free fluid in the abdomen. Calcification of abdominal aorta without aneurysm.  Pelvis:  The uterus and adnexal structures are not enlarged. Bladder wall is not thickened.  No free or loculated pelvic fluid collection.  Pelvic lymph nodes are not enlarged.  Postoperative changes in the left groin consistent with hernia repair.  Stool filled colon without distension or inflammatory change.  The appendix is normal.  Normal alignment of the lumbar vertebrae.  IMPRESSION: Suggestion of early inflammatory process or fatty infiltration in the head of the pancreas.  41-month follow-up is recommended to exclude underlying mass lesion.  Mild bile duct dilatation is likely physiologic after cholecystectomy.  Small esophageal hiatal hernia.  Original Report Authenticated By: Marlon Pel, M.D.    Past Medical History, Surgical History, Social History, Family History, Problem List, Medications, and Allergies have been reviewed and updated if relevant.  Review of Systems: N/v/d - all resolved. No fever, chill, sweats.  Physical Examination: Filed Vitals:   08/23/11 1020  BP: 120/84  Pulse: 59  Temp: 98.4 F (36.9 C)  TempSrc: Oral  Height: 5' 3.75" (1.619 m)  Weight: 192 lb 12.8 oz (87.454 kg)  SpO2: 95%    Body mass index is 33.35 kg/(m^2).   GEN: WDWN, NAD, Non-toxic, A & O x 3 HEENT: Atraumatic, Normocephalic. Neck supple. No masses, No LAD. Ears and Nose: No external deformity. CV: RRR, No M/G/R. No JVD. No thrill. No extra heart sounds. PULM: CTA B, no wheezes, crackles, rhonchi. No retractions. No resp. distress. No accessory muscle use. EXTR: No c/c/e NEURO Normal gait.  PSYCH: Normally interactive. Conversant.  Not depressed or anxious appearing.  Calm demeanor.    Assessment and Plan:  1. Pancreatitis  Lipase  2. Elevated liver function tests  Hepatic function panel   Labs are back to normal. She will need to have a repeat of her CT of her abdomen and pelvis in 6  months. I will order this as a future order.  Results for orders placed in visit on 08/23/11  HEPATIC FUNCTION PANEL      Component Value Range   Total Bilirubin 0.4  0.3 - 1.2 (mg/dL)   Bilirubin, Direct 0.1  0.0 - 0.3 (mg/dL)   Alkaline Phosphatase 82  39 - 117 (U/L)   AST 27  0 - 37 (U/L)   ALT 34  0 - 35 (U/L)   Total Protein 6.6  6.0 - 8.3 (g/dL)   Albumin 3.7  3.5 - 5.2 (g/dL)  LIPASE      Component Value Range   Lipase 30.0  11.0 - 59.0 (U/L)     Orders Today: Orders Placed This Encounter  Procedures  . Hepatic function panel  . Lipase    Medications Today: No orders of the defined types were placed in this encounter.

## 2011-08-24 ENCOUNTER — Encounter: Payer: Self-pay | Admitting: *Deleted

## 2011-09-19 ENCOUNTER — Other Ambulatory Visit: Payer: Self-pay | Admitting: Family Medicine

## 2011-09-22 ENCOUNTER — Telehealth: Payer: Self-pay | Admitting: Family Medicine

## 2011-09-22 NOTE — Telephone Encounter (Signed)
Error

## 2011-12-18 ENCOUNTER — Telehealth: Payer: Self-pay | Admitting: Family Medicine

## 2011-12-18 NOTE — Telephone Encounter (Signed)
Caller: Dera/Patient; Patient Name: Rebekah Johnston; PCP: Excell Seltzer.; Best Callback Phone Number: [phone].  Pt reports sinus pain, earache onset after mowing on 12/15/11.  Roaring sound in left ear.  Temp 100.4 oral.  Pain rated at 6 of 10, worse with swallowing.  Pain interferes with sleep.  Emergent sx ruled out.  Home care and follow up with provider in  24 hours per Ear:  Symptoms protocol.  Appointment with Dr. Ermalene Searing at 11:45 on 12/19/11.  Home care for the interim and parameters for callback given.

## 2011-12-19 ENCOUNTER — Encounter: Payer: Self-pay | Admitting: Family Medicine

## 2011-12-19 ENCOUNTER — Ambulatory Visit (INDEPENDENT_AMBULATORY_CARE_PROVIDER_SITE_OTHER): Payer: Self-pay | Admitting: Family Medicine

## 2011-12-19 VITALS — BP 120/76 | HR 85 | Temp 98.4°F | Ht 63.75 in | Wt 194.5 lb

## 2011-12-19 DIAGNOSIS — H6692 Otitis media, unspecified, left ear: Secondary | ICD-10-CM

## 2011-12-19 DIAGNOSIS — H6690 Otitis media, unspecified, unspecified ear: Secondary | ICD-10-CM

## 2011-12-19 MED ORDER — AMOXICILLIN 500 MG PO TABS: 500.0000 mg | ORAL_TABLET | Freq: Two times a day (BID) | ORAL | Status: AC

## 2011-12-19 NOTE — Patient Instructions (Addendum)
Start antibiotics for 10 days. Can use tylenol for pain, mucinex DM for cough and to break up mucus. Nasal saline irrigation. Follow up in 2 weeks to re-eval ear.

## 2011-12-19 NOTE — Progress Notes (Signed)
  Subjective:    Patient ID: Rebekah Johnston, female    DOB: 1950-12-30, 61 y.o.   MRN: [id number]  Sinusitis The current episode started in the past 7 days (started after mowing lawn last week). The problem has been gradually worsening since onset. The maximum temperature recorded prior to her arrival was 100 - 100.9 F (4 days ago, low grade since). Associated symptoms include congestion, coughing, ear pain, headaches, shortness of breath, sneezing and a sore throat. Pertinent negatives include no chills, neck pain, sinus pressure or swollen glands. (Mild SOB, some lightheaded with bending over.) Past treatments include acetaminophen. The treatment provided mild relief.  Otalgia  There is pain in the left ear. This is a new problem. The current episode started in the past 7 days. The problem has been gradually worsening. The maximum temperature recorded prior to her arrival was 100 - 100.9 F. Associated symptoms include coughing, ear discharge, headaches, hearing loss and a sore throat. Pertinent negatives include no neck pain or rash. Associated symptoms comments: bloody discharge from left ear.. There is no history of a chronic ear infection or hearing loss.      Review of Systems  Constitutional: Negative for chills.  HENT: Positive for hearing loss, ear pain, congestion, sore throat, sneezing and ear discharge. Negative for neck pain and sinus pressure.   Respiratory: Positive for cough and shortness of breath.   Skin: Negative for rash.  Neurological: Positive for headaches.       Objective:   Physical Exam  Constitutional: Vital signs are normal. She appears well-developed and well-nourished. She is cooperative.  Non-toxic appearance. She does not appear ill. No distress.  HENT:  Head: Normocephalic.  Right Ear: Hearing, tympanic membrane, external ear and ear canal normal. Tympanic membrane is not erythematous, not retracted and not bulging.  Left Ear: Ear canal normal. Tympanic  membrane is erythematous and bulging. Tympanic membrane is not retracted. A middle ear effusion is present. Decreased hearing is noted.  Nose: Mucosal edema and rhinorrhea present. Right sinus exhibits no maxillary sinus tenderness and no frontal sinus tenderness. Left sinus exhibits no maxillary sinus tenderness and no frontal sinus tenderness.  Mouth/Throat: Uvula is midline, oropharynx is clear and moist and mucous membranes are normal.       No rupture, but blood in ear canal dried. No external ear swelling or pain  Eyes: Conjunctivae, EOM and lids are normal. Pupils are equal, round, and reactive to light. No foreign bodies found.  Neck: Trachea normal and normal range of motion. Neck supple. Carotid bruit is not present. No mass and no thyromegaly present.  Cardiovascular: Normal rate, regular rhythm, S1 normal, S2 normal, normal heart sounds, intact distal pulses and normal pulses.  Exam reveals no gallop and no friction rub.   No murmur heard. Pulmonary/Chest: Effort normal and breath sounds normal. Not tachypneic. No respiratory distress. She has no decreased breath sounds. She has no wheezes. She has no rhonchi. She has no rales.  Neurological: She is alert.  Skin: Skin is warm, dry and intact. No rash noted.  Psychiatric: Her speech is normal and behavior is normal. Judgment normal. Her mood appears not anxious. Cognition and memory are normal. She does not exhibit a depressed mood.          Assessment & Plan:

## 2011-12-20 DIAGNOSIS — H6692 Otitis media, unspecified, left ear: Secondary | ICD-10-CM | POA: Insufficient documentation

## 2011-12-20 NOTE — Assessment & Plan Note (Signed)
No rupture apparent , but blood in ear canal. Treat with amox x 10 days and follow up to assure resolution of infection and hearing loss.

## 2012-01-02 ENCOUNTER — Ambulatory Visit (INDEPENDENT_AMBULATORY_CARE_PROVIDER_SITE_OTHER): Payer: Self-pay | Admitting: Family Medicine

## 2012-01-02 ENCOUNTER — Encounter: Payer: Self-pay | Admitting: Family Medicine

## 2012-01-02 VITALS — BP 116/70 | HR 72 | Temp 97.6°F | Wt 194.0 lb

## 2012-01-02 DIAGNOSIS — H6690 Otitis media, unspecified, unspecified ear: Secondary | ICD-10-CM

## 2012-01-02 DIAGNOSIS — H6692 Otitis media, unspecified, left ear: Secondary | ICD-10-CM

## 2012-01-02 MED ORDER — AMOXICILLIN-POT CLAVULANATE 875-125 MG PO TABS: 1.0000 | ORAL_TABLET | Freq: Two times a day (BID) | ORAL | Status: AC

## 2012-01-02 NOTE — Patient Instructions (Addendum)
Nasal saline irrigation. If symptoms have resolved and no remaining hearing loss at end of Augmentin course, no further follow up needed. If symptoms remain, follow up in 2 weeks.

## 2012-01-02 NOTE — Progress Notes (Signed)
  Subjective:    Patient ID: Rebekah Johnston, female    DOB: [birth year], 61 y.o.   MRN: [id number]  HPI 61 year old female presents for follow up:  Last OV:12/20/2011  Left acute otitis media - No rupture apparent , but blood in ear canal. Treat with amox x 10 days and follow up to assure resolution of infection and hearing loss.   Today she reports that left ear pain improved but still some fullness sensation in left ear. Completed whole course of antibiotics...did take 2 tabs twice daily initially but had some dizziness, then changed to one tablet twice daily for the remainder of the course. LAst dose will be today.   Better hearing,still has some diminished hearing in that ear.  Mild cough, congestion improved... Was moving and there was a lot of dust.    Review of Systems  Constitutional: Negative for fever and fatigue.  HENT: Negative for ear pain.   Eyes: Negative for pain.  Respiratory: Negative for chest tightness and shortness of breath.   Cardiovascular: Negative for chest pain, palpitations and leg swelling.  Gastrointestinal: Negative for abdominal pain.  Genitourinary: Negative for dysuria.       Objective:   Physical Exam  Constitutional: Vital signs are normal. She appears well-developed and well-nourished. She is cooperative.  Non-toxic appearance. She does not appear ill. No distress.  HENT:  Head: Normocephalic.  Right Ear: Hearing, tympanic membrane, external ear and ear canal normal. Tympanic membrane is not erythematous, not retracted and not bulging.  Left Ear: Hearing, external ear and ear canal normal. Tympanic membrane is bulging. Tympanic membrane is not erythematous and not retracted. A middle ear effusion is present.  Nose: Mucosal edema and rhinorrhea present. Right sinus exhibits no maxillary sinus tenderness and no frontal sinus tenderness. Left sinus exhibits no maxillary sinus tenderness and no frontal sinus tenderness.  Mouth/Throat: Uvula is  midline, oropharynx is clear and moist and mucous membranes are normal.       Left ear canal with dried blood, no pain with movement Yellowish fluid remains behind left TM.  Eyes: Conjunctivae, EOM and lids are normal. Pupils are equal, round, and reactive to light. No foreign bodies found.  Neck: Trachea normal and normal range of motion. Neck supple. Carotid bruit is not present. No mass and no thyromegaly present.  Cardiovascular: Normal rate, regular rhythm, S1 normal, S2 normal, normal heart sounds, intact distal pulses and normal pulses.  Exam reveals no gallop and no friction rub.   No murmur heard. Pulmonary/Chest: Effort normal and breath sounds normal. Not tachypneic. No respiratory distress. She has no decreased breath sounds. She has no wheezes. She has no rhonchi. She has no rales.  Neurological: She is alert.  Skin: Skin is warm, dry and intact. No rash noted.  Psychiatric: Her speech is normal and behavior is normal. Judgment normal. Her mood appears not anxious. Cognition and memory are normal. She does not exhibit a depressed mood.          Assessment & Plan:

## 2012-01-02 NOTE — Assessment & Plan Note (Signed)
Remaining yellowish fluid behind left TM... Will broaden antibiotics to augmentin 875 mg BID. NAsal saline spray in nose to help celar any congestion blockage of eustacian tube.  Follow up if not resolved at end of course of antibiotics.

## 2012-02-20 ENCOUNTER — Ambulatory Visit (INDEPENDENT_AMBULATORY_CARE_PROVIDER_SITE_OTHER)
Admission: RE | Admit: 2012-02-20 | Discharge: 2012-02-20 | Disposition: A | Discharge status: Home / Self Care | Payer: Self-pay | Source: Ambulatory Visit | Attending: Family Medicine | Admitting: Family Medicine

## 2012-02-20 DIAGNOSIS — R935 Abnormal findings on diagnostic imaging of other abdominal regions, including retroperitoneum: Secondary | ICD-10-CM

## 2012-02-20 MED ORDER — IOHEXOL 350 MG/ML SOLN
100.0000 mL | Freq: Once | INTRAVENOUS | Status: AC | PRN
  Administered 2012-02-20: 100 mL via INTRAVENOUS

## 2012-02-21 ENCOUNTER — Telehealth: Payer: Self-pay

## 2012-02-21 NOTE — Telephone Encounter (Signed)
Pt said returning Lugene's call from 02/20/12; pt said had CT done 02/20/12; could not find note in chart. Pt request call back with results.

## 2012-05-22 ENCOUNTER — Encounter: Payer: Self-pay | Admitting: Family Medicine

## 2012-05-22 ENCOUNTER — Ambulatory Visit (INDEPENDENT_AMBULATORY_CARE_PROVIDER_SITE_OTHER): Payer: Self-pay | Admitting: Family Medicine

## 2012-05-22 VITALS — BP 130/80 | HR 86 | Temp 98.6°F | Ht 63.75 in | Wt 194.2 lb

## 2012-05-22 DIAGNOSIS — J0100 Acute maxillary sinusitis, unspecified: Secondary | ICD-10-CM

## 2012-05-22 MED ORDER — AMOXICILLIN 500 MG PO CAPS: 1000.0000 mg | ORAL_CAPSULE | Freq: Two times a day (BID) | ORAL | Status: DC

## 2012-05-22 NOTE — Progress Notes (Signed)
Nature conservation officer at [facility] [address] [place] [address] Phone: [phone] Fax: [phone]  Date:  05/22/2012   Name:  Rebekah Johnston   DOB:  March 02, 1951   MRN:  [id number] Gender: female Age: 62 y.o.  Primary Physician:  Kerby Nora, MD  Evaluating MD: Hannah Beat, MD   Chief Complaint: Cough and Nasal Congestion   History of Present Illness:  Rebekah Johnston is a 62 y.o. pleasant patient who presents with the following:  Sinus presure, cough. No fever, a little muscle aches in her left arm, neck has been a little achy. Started Sunday - taking some mucinex. Sinuses kicked iin and is getting worse starting last night. Still a dull headache. Mostly on the Right side, tooth ache and some pain behind her eyes.   Patient Active Problem List  Diagnosis  . HYPERCHOLESTEROLEMIA  . DEPRESSION  . ALLERGIC RHINITIS  . EXERCISE INDUCED ASTHMA  . RENAL INSUFFICIENCY, CHRONIC  . OSTEOARTHRITIS  . PREDIABETES  . UMBILICAL HERNIORRHAPHY, HX OF    Past Medical History  Diagnosis Date  . Osteoarthritis   . Depression   . Allergic rhinitis   . Iron deficiency anemia   . Asthma     Past Surgical History  Procedure Date  . Cholecystectomy   . Tonsillectomy   . Bunionectomy     rt. foot  . Hernia repair     History  Substance Use Topics  . Smoking status: Former Games developer  . Smokeless tobacco: Not on file     Comment: smoked socially not everyday thing.  . Alcohol Use: 0.6 oz/week    1 Glasses of wine per week     Comment: occasional once a month    Family History  Problem Relation Age of Onset  . Prostate cancer Father   . Dementia Mother   . Diabetes Brother   . Melanoma      Uncle    No Known Allergies  Medication list has been reviewed and updated.  Outpatient Prescriptions Prior to Visit  Medication Sig Dispense Refill  . acetaminophen (TYLENOL) 500 MG tablet Take 500 mg by mouth every 6 (six) hours as needed. For pain relief       . beclomethasone (QVAR) 80 MCG/ACT inhaler Inhale 1 puff into the lungs 2 (two) times daily.  1 Inhaler  12  . citalopram (CELEXA) 40 MG tablet TAKE 1 TABLET BY MOUTH ONCE A DAY  90 tablet  3   Last reviewed on 05/22/2012 11:05 AM by Consuello Masse, CMA  Review of Systems:  ROS: GEN: Acute illness details above GI: Tolerating PO intake GU: maintaining adequate hydration and urination Pulm: No SOB Interactive and getting along well at home.  Otherwise, ROS is as per the HPI.   Physical Examination: BP 130/80  Pulse 86  Temp 98.6 F (37 C) (Oral)  Ht 5' 3.75" (1.619 m)  Wt 194 lb 4 oz (88.111 kg)  BMI 33.60 kg/m2  SpO2 96%   Ideal Body Weight: Weight in (lb) to have BMI = 25: 144.2    Gen: WDWN, NAD; alert,appropriate and cooperative throughout exam  HEENT: Normocephalic and atraumatic. Throat clear, w/o exudate, no LAD, R TM clear, L TM - good landmarks, No fluid present. rhinnorhea.  Left frontal and maxillary sinuses: non-Tender Right frontal and maxillary sinuses: Tender max  Neck: No ant or post LAD CV: RRR, No M/G/R Pulm: Breathing comfortably in no resp distress. no w/c/r Abd: S,NT,ND,+BS Extr:  no c/c/e Psych: full affect, pleasant   Assessment and Plan:  1. Sinusitis, acute maxillary    Acute sinusitis: ABX as below.  Reviewed symptomatic care as well as ABX in this case.    Orders Today:  No orders of the defined types were placed in this encounter.    Updated Medication List: (Includes new medications, updates to list, dose adjustments) Meds ordered this encounter  Medications  . amoxicillin (AMOXIL) 500 MG capsule    Sig: Take 2 capsules (1,000 mg total) by mouth 2 (two) times daily.    Dispense:  40 capsule    Refill:  0    Medications Discontinued: There are no discontinued medications.   Signed, Elpidio Galea. Asad Keeven, MD 05/22/2012 11:10 AM

## 2012-05-30 NOTE — Telephone Encounter (Signed)
This was taken care of by phone.

## 2012-05-30 NOTE — Telephone Encounter (Signed)
Can this encounter be closed?

## 2012-08-13 ENCOUNTER — Emergency Department: Payer: Self-pay | Admitting: Emergency Medicine

## 2012-08-13 LAB — CBC
HGB: 14.1 g/dL (ref 12.0–16.0)
MCH: 28.5 pg (ref 26.0–34.0)
MCHC: 33.4 g/dL (ref 32.0–36.0)
MCV: 85 fL (ref 80–100)
Platelet: 281 10*3/uL (ref 150–440)
WBC: 6.7 10*3/uL (ref 3.6–11.0)

## 2012-08-13 LAB — TROPONIN I: Troponin-I: <0.02 ng/mL

## 2012-08-13 LAB — BASIC METABOLIC PANEL
BUN: 11 mg/dL (ref 7–18)
Chloride: 105 mmol/L (ref 98–107)
Co2: 24 mmol/L (ref 21–32)
Creatinine: 1.3 mg/dL (ref 0.60–1.30)
EGFR (African American): 51 — ABNORMAL LOW
EGFR (Non-African Amer.): 44 — ABNORMAL LOW
Glucose: 139 mg/dL — ABNORMAL HIGH (ref 65–99)

## 2012-08-13 LAB — CK TOTAL AND CKMB (NOT AT ARMC)
CK, Total: 55 U/L (ref 21–215)
CK-MB: 0.7 ng/mL (ref 0.5–3.6)

## 2012-08-13 LAB — LIPASE, BLOOD: Lipase: 145 U/L (ref 73–393)

## 2012-09-25 ENCOUNTER — Other Ambulatory Visit: Payer: Self-pay | Admitting: Family Medicine

## 2012-10-14 ENCOUNTER — Other Ambulatory Visit: Payer: Self-pay | Admitting: Family Medicine

## 2012-10-26 ENCOUNTER — Emergency Department: Payer: Self-pay | Admitting: Emergency Medicine

## 2012-10-28 ENCOUNTER — Ambulatory Visit (INDEPENDENT_AMBULATORY_CARE_PROVIDER_SITE_OTHER): Payer: BC Managed Care – PPO | Admitting: Family Medicine

## 2012-10-28 ENCOUNTER — Encounter: Payer: Self-pay | Admitting: Family Medicine

## 2012-10-28 VITALS — BP 124/80 | HR 75 | Temp 98.2°F | Wt 196.5 lb

## 2012-10-28 DIAGNOSIS — L659 Nonscarring hair loss, unspecified: Secondary | ICD-10-CM

## 2012-10-28 DIAGNOSIS — J069 Acute upper respiratory infection, unspecified: Secondary | ICD-10-CM

## 2012-10-28 MED ORDER — ALBUTEROL SULFATE HFA 108 (90 BASE) MCG/ACT IN AERS: 2.0000 | INHALATION_SPRAY | Freq: Four times a day (QID) | RESPIRATORY_TRACT | Status: DC | PRN

## 2012-10-28 MED ORDER — DOXYCYCLINE HYCLATE 100 MG PO TABS: 100.0000 mg | ORAL_TABLET | Freq: Two times a day (BID) | ORAL | Status: DC

## 2012-10-28 MED ORDER — BECLOMETHASONE DIPROPIONATE 80 MCG/ACT IN AERS: 1.0000 | INHALATION_SPRAY | Freq: Two times a day (BID) | RESPIRATORY_TRACT | Status: DC

## 2012-10-28 NOTE — Progress Notes (Signed)
Scratchy throat, cough, sneezing.  Sleep disrupted.  To [facility] 4AM Saturday.  Facial pressure. Given vicodin and prednisone at ER.  Finishing both today.  Sick contact noted. She had run out of QVAR prev.  Hasn't had SABA to use.  Some wheeze at night, better now than prev, better on prednisone.  No fevers.  no myalgias.  Now she feels some better overall than prev.  Facial pressure is much better.  Coughing up some phlegm, slightly tinted.    Asthma was usually activity induced prev. She doesn't smoke now.   She has patchy hair loss and thinning eyebrows.  She asks about TSH check and I'll defer to PCP as she has been acute ill recently.   Meds, vitals, and allergies reviewed.   ROS: See HPI.  Otherwise, noncontributory.  GEN: nad, alert and oriented HEENT: mucous membranes moist, tm w/o erythema, nasal exam w/o erythema, clear discharge noted,  OP with cobblestoning NECK: supple w/o LA, UAN noted but no stridor CV: rrr.   PULM: ctab, no inc wob EXT: no edema SKIN: no acute rash

## 2012-10-28 NOTE — Patient Instructions (Addendum)
Start back on the qvar, use the albuterol for the cough.  Start the antibiotics in a few days if needed (let us know if you don't improve).   I'll ask Dr. Ermalene Searing about your thyroid labs in the meantime.  Take care.

## 2012-10-29 ENCOUNTER — Telehealth: Payer: Self-pay | Admitting: Family Medicine

## 2012-10-29 DIAGNOSIS — J069 Acute upper respiratory infection, unspecified: Secondary | ICD-10-CM | POA: Insufficient documentation

## 2012-10-29 DIAGNOSIS — L659 Nonscarring hair loss, unspecified: Secondary | ICD-10-CM | POA: Insufficient documentation

## 2012-10-29 NOTE — Assessment & Plan Note (Signed)
She has patchy hair loss and thinning eyebrows.  She asks about TSH check and I'll defer to PCP as she has been acute ill recently.

## 2012-10-29 NOTE — Telephone Encounter (Signed)
Please call pt... It is okay to have her come in to check TSH and free t3 and free t4.. Please order and schedule. Diagnosis alopecia

## 2012-10-29 NOTE — Assessment & Plan Note (Signed)
Should resolve, hold abx in case of inc in sputum, restart inhaled steroid and use SABA for cough in meantime.  She agrees.  F/u prn.

## 2012-10-30 NOTE — Telephone Encounter (Signed)
appt scheduled and labs ordered

## 2012-10-30 NOTE — Addendum Note (Signed)
Addended by: Consuello Masse on: 10/30/2012 10:10 AM   Modules accepted: Orders

## 2012-10-31 ENCOUNTER — Other Ambulatory Visit (INDEPENDENT_AMBULATORY_CARE_PROVIDER_SITE_OTHER): Payer: BC Managed Care – PPO

## 2012-10-31 DIAGNOSIS — L659 Nonscarring hair loss, unspecified: Secondary | ICD-10-CM

## 2012-10-31 LAB — T4, FREE: Free T4: 0.81 ng/dL (ref 0.60–1.60)

## 2012-10-31 LAB — TSH: TSH: 2.49 u[IU]/mL (ref 0.35–5.50)

## 2012-11-05 ENCOUNTER — Encounter: Payer: Self-pay | Admitting: *Deleted

## 2013-03-15 ENCOUNTER — Other Ambulatory Visit: Payer: Self-pay | Admitting: Family Medicine

## 2013-03-16 NOTE — Telephone Encounter (Signed)
Last office visit 10/28/2012 with Dr. Para March for URI.  Ok to refill?

## 2013-03-17 NOTE — Telephone Encounter (Signed)
Okay to refill but pt needs yearly wellness/preventative visit for further refills.

## 2013-03-17 NOTE — Telephone Encounter (Signed)
[name] notified to call our office and schedule a wellness exam prior to additional refills on her Celexa.

## 2013-04-08 ENCOUNTER — Telehealth: Payer: Self-pay

## 2013-04-08 NOTE — Telephone Encounter (Signed)
Pt left v/m returning Tasha's call; request cb [phone].

## 2013-04-10 NOTE — Telephone Encounter (Signed)
Left message on machine for pt to return my call. Qvar needs a prior auth and we need to know if she has tried and failed any other medications.

## 2013-05-07 ENCOUNTER — Other Ambulatory Visit: Payer: Self-pay | Admitting: Family Medicine

## 2013-05-07 NOTE — Telephone Encounter (Signed)
Last office visit 10/28/2012 with Dr. Damita Dunnings.  Ok to refill?

## 2013-06-20 ENCOUNTER — Encounter: Payer: Self-pay | Admitting: Family Medicine

## 2013-06-20 ENCOUNTER — Ambulatory Visit (INDEPENDENT_AMBULATORY_CARE_PROVIDER_SITE_OTHER): Payer: BC Managed Care – PPO | Admitting: Family Medicine

## 2013-06-20 VITALS — BP 120/80 | HR 77 | Temp 98.0°F | Ht 63.75 in | Wt 196.2 lb

## 2013-06-20 DIAGNOSIS — R7309 Other abnormal glucose: Secondary | ICD-10-CM

## 2013-06-20 DIAGNOSIS — F329 Major depressive disorder, single episode, unspecified: Secondary | ICD-10-CM

## 2013-06-20 DIAGNOSIS — F3289 Other specified depressive episodes: Secondary | ICD-10-CM

## 2013-06-20 DIAGNOSIS — E7800 Pure hypercholesterolemia, unspecified: Secondary | ICD-10-CM

## 2013-06-20 LAB — LIPID PANEL
Cholesterol: 244 mg/dL — ABNORMAL HIGH (ref 0–200)
HDL: 47.4 mg/dL (ref >39.00)
TRIGLYCERIDES: 114 mg/dL (ref 0.0–149.0)
Total CHOL/HDL Ratio: 5
VLDL: 22.8 mg/dL (ref 0.0–40.0)

## 2013-06-20 LAB — COMPREHENSIVE METABOLIC PANEL
ALBUMIN: 3.8 g/dL (ref 3.5–5.2)
ALT: 17 U/L (ref 0–35)
AST: 17 U/L (ref 0–37)
Alkaline Phosphatase: 76 U/L (ref 39–117)
BUN: 11 mg/dL (ref 6–23)
CALCIUM: 9.6 mg/dL (ref 8.4–10.5)
CHLORIDE: 103 meq/L (ref 96–112)
CO2: 28 meq/L (ref 19–32)
CREATININE: 1.4 mg/dL — AB (ref 0.4–1.2)
GFR: 40.95 mL/min — AB (ref >60.00)
GLUCOSE: 104 mg/dL — AB (ref 70–99)
POTASSIUM: 4.6 meq/L (ref 3.5–5.1)
Sodium: 138 mEq/L (ref 135–145)
Total Bilirubin: 0.7 mg/dL (ref 0.3–1.2)
Total Protein: 7 g/dL (ref 6.0–8.3)

## 2013-06-20 LAB — LDL CHOLESTEROL, DIRECT: LDL DIRECT: 179.3 mg/dL

## 2013-06-20 MED ORDER — FLUTICASONE PROPIONATE HFA 110 MCG/ACT IN AERO: 1.0000 | INHALATION_SPRAY | Freq: Two times a day (BID) | RESPIRATORY_TRACT | Status: DC

## 2013-06-20 MED ORDER — CITALOPRAM HYDROBROMIDE 40 MG PO TABS: ORAL_TABLET | ORAL | Status: DC

## 2013-06-20 NOTE — Patient Instructions (Addendum)
Stop at labs on way out.  Schedule CPX with no labs prior as having labs done today.

## 2013-06-20 NOTE — Progress Notes (Signed)
   Subjective:    Patient ID: Rebekah Johnston, female    DOB: 1950/11/22, 63 y.o.   MRN: [id number]  HPI   63 year old female with history of prediabetes, high cholesterol, CRI, asthma and depression  Returns for overdue follow up appt.   She has not had labs since 2013.  Sh is over due for CPX and  PAP/DVE.  She current is maintained with her asthma on Qvar and albuterol prn.  Has not uses rescue in last 6 -12 months. She is interested in a cheaper alternative to Qvar.  She has not tried anything else in past.  Depression, well controlled on celexa. NO SI, No insomnia.     Review of Systems  Constitutional: Negative for fever and fatigue.  HENT: Negative for ear pain.   Eyes: Negative for pain.  Respiratory: Negative for chest tightness and shortness of breath.   Cardiovascular: Negative for chest pain, palpitations and leg swelling.  Gastrointestinal: Negative for abdominal pain.  Genitourinary: Negative for dysuria.       Objective:   Physical Exam  Constitutional: Vital signs are normal. She appears well-developed and well-nourished. She is cooperative.  Non-toxic appearance. She does not appear ill. No distress.  HENT:  Head: Normocephalic.  Right Ear: Hearing, tympanic membrane, external ear and ear canal normal. Tympanic membrane is not erythematous, not retracted and not bulging.  Left Ear: Hearing, tympanic membrane, external ear and ear canal normal. Tympanic membrane is not erythematous, not retracted and not bulging.  Nose: No mucosal edema or rhinorrhea. Right sinus exhibits no maxillary sinus tenderness and no frontal sinus tenderness. Left sinus exhibits no maxillary sinus tenderness and no frontal sinus tenderness.  Mouth/Throat: Uvula is midline, oropharynx is clear and moist and mucous membranes are normal.  Eyes: Conjunctivae, EOM and lids are normal. Pupils are equal, round, and reactive to light. Lids are everted and swept, no foreign bodies found.    Neck: Trachea normal and normal range of motion. Neck supple. Carotid bruit is not present. No mass and no thyromegaly present.  Cardiovascular: Normal rate, regular rhythm, S1 normal, S2 normal, normal heart sounds, intact distal pulses and normal pulses.  Exam reveals no gallop and no friction rub.   No murmur heard. Pulmonary/Chest: Effort normal and breath sounds normal. Not tachypneic. No respiratory distress. She has no decreased breath sounds. She has no wheezes. She has no rhonchi. She has no rales.  Abdominal: Soft. Normal appearance and bowel sounds are normal. There is no tenderness.  Neurological: She is alert.  Skin: Skin is warm, dry and intact. No rash noted.  Psychiatric: Her speech is normal and behavior is normal. Judgment and thought content normal. Her mood appears not anxious. Cognition and memory are normal. She does not exhibit a depressed mood.          Assessment & Plan:

## 2013-06-20 NOTE — Addendum Note (Signed)
Addended by: Ellamae Sia on: 06/20/2013 09:18 AM   Modules accepted: Orders

## 2013-06-20 NOTE — Progress Notes (Signed)
Pre visit review using our clinic review tool, if applicable. No additional management support is needed unless otherwise documented below in the visit note. 

## 2013-06-30 ENCOUNTER — Other Ambulatory Visit (HOSPITAL_COMMUNITY)
Admission: RE | Admit: 2013-06-30 | Discharge: 2013-06-30 | Disposition: A | Discharge status: Home / Self Care | Payer: BC Managed Care – PPO | Source: Ambulatory Visit | Attending: Family Medicine | Admitting: Family Medicine

## 2013-06-30 ENCOUNTER — Encounter: Payer: Self-pay | Admitting: Family Medicine

## 2013-06-30 ENCOUNTER — Ambulatory Visit (INDEPENDENT_AMBULATORY_CARE_PROVIDER_SITE_OTHER): Payer: BC Managed Care – PPO | Admitting: Family Medicine

## 2013-06-30 VITALS — BP 135/83 | HR 75 | Temp 97.8°F | Ht 64.0 in | Wt 194.5 lb

## 2013-06-30 DIAGNOSIS — E348 Other specified endocrine disorders: Secondary | ICD-10-CM

## 2013-06-30 DIAGNOSIS — Z124 Encounter for screening for malignant neoplasm of cervix: Secondary | ICD-10-CM

## 2013-06-30 DIAGNOSIS — Z1231 Encounter for screening mammogram for malignant neoplasm of breast: Secondary | ICD-10-CM

## 2013-06-30 DIAGNOSIS — Z0000 Encounter for general adult medical examination without abnormal findings: Secondary | ICD-10-CM

## 2013-06-30 DIAGNOSIS — N189 Chronic kidney disease, unspecified: Secondary | ICD-10-CM

## 2013-06-30 DIAGNOSIS — Z01419 Encounter for gynecological examination (general) (routine) without abnormal findings: Secondary | ICD-10-CM | POA: Insufficient documentation

## 2013-06-30 DIAGNOSIS — Z1151 Encounter for screening for human papillomavirus (HPV): Secondary | ICD-10-CM | POA: Insufficient documentation

## 2013-06-30 DIAGNOSIS — E7800 Pure hypercholesterolemia, unspecified: Secondary | ICD-10-CM

## 2013-06-30 NOTE — Patient Instructions (Addendum)
Working on weight loss, healthy eating, low carb/ low cholesterol diet , exercise.  Avoid fast food.  Return for lab cholesterol check in 3 months, fasting. Push water. Stop at front desk to set up an appt with kidney specialist,  mammo and DEXA. Call insurance about shingles vaccine coverage. Follow up in 6 months for check up with Dr. Jacinto Reap.      Fat and Cholesterol Control Diet Fat and cholesterol levels in your blood and organs are influenced by your diet. High levels of fat and cholesterol may lead to diseases of the heart, small and large blood vessels, gallbladder, liver, and pancreas. CONTROLLING FAT AND CHOLESTEROL WITH DIET Although exercise and lifestyle factors are important, your diet is key. That is because certain foods are known to raise cholesterol and others to lower it. The goal is to balance foods for their effect on cholesterol and more importantly, to replace saturated and trans fat with other types of fat, such as monounsaturated fat, polyunsaturated fat, and omega-3 fatty acids. On average, a person should consume no more than 15 to 17 g of saturated fat daily. Saturated and trans fats are considered "bad" fats, and they will raise LDL cholesterol. Saturated fats are primarily found in animal products such as meats, butter, and cream. However, that does not mean you need to give up all your favorite foods. Today, there are good tasting, low-fat, low-cholesterol substitutes for most of the things you like to eat. Choose low-fat or nonfat alternatives. Choose round or loin cuts of red meat. These types of cuts are lowest in fat and cholesterol. Chicken (without the skin), fish, veal, and ground [place] breast are great choices. Eliminate fatty meats, such as hot dogs and salami. Even shellfish have little or no saturated fat. Have a 3 oz (85 g) portion when you eat lean meat, poultry, or fish. Trans fats are also called "partially hydrogenated oils." They are oils that have been  scientifically manipulated so that they are solid at room temperature resulting in a longer shelf life and improved taste and texture of foods in which they are added. Trans fats are found in stick margarine, some tub margarines, cookies, crackers, and baked goods.  When baking and cooking, oils are a great substitute for butter. The monounsaturated oils are especially beneficial since it is believed they lower LDL and raise HDL. The oils you should avoid entirely are saturated tropical oils, such as coconut and palm.  Remember to eat a lot from food groups that are naturally free of saturated and trans fat, including fish, fruit, vegetables, beans, grains (barley, rice, couscous, bulgur wheat), and pasta (without cream sauces).  IDENTIFYING FOODS THAT LOWER FAT AND CHOLESTEROL  Soluble fiber may lower your cholesterol. This type of fiber is found in fruits such as apples, vegetables such as broccoli, potatoes, and carrots, legumes such as beans, peas, and lentils, and grains such as barley. Foods fortified with plant sterols (phytosterol) may also lower cholesterol. You should eat at least 2 g per day of these foods for a cholesterol lowering effect.  Read package labels to identify low-saturated fats, trans fat free, and low-fat foods at the supermarket. Select cheeses that have only 2 to 3 g saturated fat per ounce. Use a heart-healthy tub margarine that is free of trans fats or partially hydrogenated oil. When buying baked goods (cookies, crackers), avoid partially hydrogenated oils. Breads and muffins should be made from whole grains (whole-wheat or whole oat flour, instead of "flour" or "enriched  flour"). Buy non-creamy canned soups with reduced salt and no added fats.  FOOD PREPARATION TECHNIQUES  Never deep-fry. If you must fry, either stir-fry, which uses very little fat, or use non-stick cooking sprays. When possible, broil, bake, or roast meats, and steam vegetables. Instead of putting butter or  margarine on vegetables, use lemon and herbs, applesauce, and cinnamon (for squash and sweet potatoes). Use nonfat yogurt, salsa, and low-fat dressings for salads.  LOW-SATURATED FAT / LOW-FAT FOOD SUBSTITUTES Meats / Saturated Fat (g)  Avoid: Steak, marbled (3 oz/85 g) / 11 g  Choose: Steak, lean (3 oz/85 g) / 4 g  Avoid: Hamburger (3 oz/85 g) / 7 g  Choose: Hamburger, lean (3 oz/85 g) / 5 g  Avoid: Ham (3 oz/85 g) / 6 g  Choose: Ham, lean cut (3 oz/85 g) / 2.4 g  Avoid: Chicken, with skin, dark meat (3 oz/85 g) / 4 g  Choose: Chicken, skin removed, dark meat (3 oz/85 g) / 2 g  Avoid: Chicken, with skin, light meat (3 oz/85 g) / 2.5 g  Choose: Chicken, skin removed, light meat (3 oz/85 g) / 1 g Dairy / Saturated Fat (g)  Avoid: Whole milk (1 cup) / 5 g  Choose: Low-fat milk, 2% (1 cup) / 3 g  Choose: Low-fat milk, 1% (1 cup) / 1.5 g  Choose: Skim milk (1 cup) / 0.3 g  Avoid: Hard cheese (1 oz/28 g) / 6 g  Choose: Skim milk cheese (1 oz/28 g) / 2 to 3 g  Avoid: Cottage cheese, 4% fat (1 cup) / 6.5 g  Choose: Low-fat cottage cheese, 1% fat (1 cup) / 1.5 g  Avoid: Ice cream (1 cup) / 9 g  Choose: Sherbet (1 cup) / 2.5 g  Choose: Nonfat frozen yogurt (1 cup) / 0.3 g  Choose: Frozen fruit bar / trace  Avoid: Whipped cream (1 tbs) / 3.5 g  Choose: Nondairy whipped topping (1 tbs) / 1 g Condiments / Saturated Fat (g)  Avoid: Mayonnaise (1 tbs) / 2 g  Choose: Low-fat mayonnaise (1 tbs) / 1 g  Avoid: Butter (1 tbs) / 7 g  Choose: Extra light margarine (1 tbs) / 1 g  Avoid: Coconut oil (1 tbs) / 11.8 g  Choose: Olive oil (1 tbs) / 1.8 g  Choose: Corn oil (1 tbs) / 1.7 g  Choose: Safflower oil (1 tbs) / 1.2 g  Choose: Sunflower oil (1 tbs) / 1.4 g  Choose: Soybean oil (1 tbs) / 2.4 g  Choose: Canola oil (1 tbs) / 1 g Document Released: 04/17/2005 Document Revised: 08/12/2012 Document Reviewed: 10/06/2010 Exit[employer] Patient Information 2014 [place],  [place].

## 2013-06-30 NOTE — Assessment & Plan Note (Signed)
1.21 to 1.4 since 2007, renal US nm 2009.  Will refer to nephrologist for further eval.

## 2013-06-30 NOTE — Progress Notes (Signed)
Pre visit review using our clinic review tool, if applicable. No additional management support is needed unless otherwise documented below in the visit note. 

## 2013-06-30 NOTE — Progress Notes (Signed)
Subjective:    Patient ID: Rebekah Johnston, female    DOB: 06/15/1950, 63 y.o.   MRN: [id number]  HPI  The patient is here for annual wellness exam and preventative care.     She is doing well overall.  Elevated Cholesterol: Poor control on no medication... Not at goal < 130. Lab Results  Component Value Date   CHOL 244* 06/20/2013   HDL 47.40 06/20/2013   LDLCALC 130* 05/03/2010   LDLDIRECT 179.3 06/20/2013   TRIG 114.0 06/20/2013   CHOLHDL 5 06/20/2013   Diet compliance:Moderate Exercise: Limited Other complaints:  Prediabetes: improved some.  CRI: worsened since last check Cr at 1.4. No HTN, no DM. No family history of kidney issues. Renal US nml 2009.  Review of Systems  Constitutional: Negative for fever and fatigue.  HENT: Negative for ear pain.   Eyes: Negative for pain.  Respiratory: Negative for chest tightness and shortness of breath.   Cardiovascular: Negative for chest pain, palpitations and leg swelling.  Gastrointestinal: Negative for abdominal pain.  Genitourinary: Negative for dysuria.       Objective:   Physical Exam  Constitutional: Vital signs are normal. She appears well-developed and well-nourished. She is cooperative.  Non-toxic appearance. She does not appear ill. No distress.  HENT:  Head: Normocephalic.  Right Ear: Hearing, tympanic membrane, external ear and ear canal normal.  Left Ear: Hearing, tympanic membrane, external ear and ear canal normal.  Nose: Nose normal.  Eyes: Conjunctivae, EOM and lids are normal. Pupils are equal, round, and reactive to light. Lids are everted and swept, no foreign bodies found.  Neck: Trachea normal and normal range of motion. Neck supple. Carotid bruit is not present. No mass and no thyromegaly present.  Cardiovascular: Normal rate, regular rhythm, S1 normal, S2 normal, normal heart sounds and intact distal pulses.  Exam reveals no gallop.   No murmur heard. Pulmonary/Chest: Effort normal and breath  sounds normal. No respiratory distress. She has no wheezes. She has no rhonchi. She has no rales.  Abdominal: Soft. Normal appearance and bowel sounds are normal. She exhibits no distension, no fluid wave, no abdominal bruit and no mass. There is no hepatosplenomegaly. There is no tenderness. There is no rebound, no guarding and no CVA tenderness. No hernia.  Genitourinary: Vagina normal and uterus normal. No breast swelling, tenderness, discharge or bleeding. Pelvic exam was performed with patient supine. There is no rash, tenderness or lesion on the right labia. There is no rash, tenderness or lesion on the left labia. Uterus is not enlarged and not tender. Cervix exhibits no motion tenderness, no discharge and no friability. Right adnexum displays no mass, no tenderness and no fullness. Left adnexum displays no mass, no tenderness and no fullness.  Lymphadenopathy:    She has no cervical adenopathy.    She has no axillary adenopathy.  Neurological: She is alert. She has normal strength. No cranial nerve deficit or sensory deficit.  Skin: Skin is warm, dry and intact. No rash noted.  Psychiatric: Her speech is normal and behavior is normal. Judgment normal. Her mood appears not anxious. Cognition and memory are normal. She does not exhibit a depressed mood.          Assessment & Plan:  The patient's preventative maintenance and recommended screening tests for an annual wellness exam were reviewed in full today. Brought up to date unless services declined.  Counselled on the importance of diet, exercise, and its role in overall health and mortality.  The patient's FH and SH was reviewed, including their home life, tobacco status, and drug and alcohol status.   Vaccines:uptodate with Td, due for shingles Colon:08/2010, sessile polyp, Dr. Fuller Plan... Repeat in 5 years. PAP/DVE: due  Mammo: due, last nml in 2012 DEXA: 2010, nml.

## 2013-07-01 NOTE — Assessment & Plan Note (Signed)
Inadequate control.  Work on weight loss, healthy eating, low carb/ low cholesterol diet , exercise.  Avoid fast food.  Return for lab cholesterol check in 3 months, fasting.

## 2013-07-03 ENCOUNTER — Encounter: Payer: Self-pay | Admitting: *Deleted

## 2013-07-23 ENCOUNTER — Ambulatory Visit
Admission: RE | Admit: 2013-07-23 | Discharge: 2013-07-23 | Disposition: A | Discharge status: Home / Self Care | Payer: BC Managed Care – PPO | Source: Ambulatory Visit | Attending: Family Medicine | Admitting: Family Medicine

## 2013-07-23 DIAGNOSIS — E348 Other specified endocrine disorders: Secondary | ICD-10-CM

## 2013-07-23 DIAGNOSIS — Z1231 Encounter for screening mammogram for malignant neoplasm of breast: Secondary | ICD-10-CM

## 2013-08-08 ENCOUNTER — Ambulatory Visit: Payer: Self-pay | Admitting: Nephrology

## 2013-09-30 ENCOUNTER — Other Ambulatory Visit (INDEPENDENT_AMBULATORY_CARE_PROVIDER_SITE_OTHER): Payer: BC Managed Care – PPO

## 2013-09-30 ENCOUNTER — Encounter (INDEPENDENT_AMBULATORY_CARE_PROVIDER_SITE_OTHER): Payer: Self-pay

## 2013-09-30 ENCOUNTER — Other Ambulatory Visit: Payer: BC Managed Care – PPO

## 2013-09-30 DIAGNOSIS — E7800 Pure hypercholesterolemia, unspecified: Secondary | ICD-10-CM

## 2013-09-30 LAB — LIPID PANEL
CHOL/HDL RATIO: 6
CHOLESTEROL: 256 mg/dL — AB (ref 0–200)
HDL: 45 mg/dL (ref >39.00)
LDL CALC: 182 mg/dL — AB (ref 0–99)
Triglycerides: 147 mg/dL (ref 0.0–149.0)
VLDL: 29.4 mg/dL (ref 0.0–40.0)

## 2013-10-02 ENCOUNTER — Telehealth: Payer: Self-pay | Admitting: Family Medicine

## 2013-10-02 NOTE — Telephone Encounter (Signed)
Message copied by Jinny Sanders on Thu Oct 02, 2013  9:59 AM ------      Message from: Carter Kitten      Created: Wed Oct 01, 2013 11:23 AM       Mardene Celeste notified as instructed by telephone.  She is willing to try cholesterol medication.  Did not tolerate statins in the past.  She mentioned that her sister takes fenofibrate and does well with that.  Please advise. ------

## 2013-10-02 NOTE — Telephone Encounter (Signed)
Message copied by Jinny Sanders on Thu Oct 02, 2013  9:56 AM ------      Message from: Carter Kitten      Created: Wed Oct 01, 2013 11:23 AM       Mardene Celeste notified as instructed by telephone.  She is willing to try cholesterol medication.  Did not tolerate statins in the past.  She mentioned that her sister takes fenofibrate and does well with that.  Please advise. ------

## 2013-10-02 NOTE — Telephone Encounter (Signed)
Fenofibrate does not improve LDL much ( targets mostly trigs). Instead would recommend low dose statin (pravastatin 10-20 mg daily), or  Red yeast rice. If she is against statins, best med would be welchol. Let me know her preference. Schedule recheck in 3 months labs only.

## 2013-10-02 NOTE — Telephone Encounter (Signed)
Dr. Beryl Meager notified as instructed by telephone.  She would like to start with the Red Yeast Rice.  I instructed her to get Red Yeast Rice 600 mg and take two tablets, two time a day.  She is already schedule for a follow up in August.  Advised to just come in a couple days prior to her appointment to get fasting labs drawn so Dr. Diona Browner would have her results at that appointment.  Patient states understanding.

## 2014-01-02 ENCOUNTER — Ambulatory Visit: Payer: BC Managed Care – PPO | Admitting: Family Medicine

## 2014-01-06 ENCOUNTER — Ambulatory Visit: Payer: BC Managed Care – PPO | Admitting: Family Medicine

## 2014-01-13 ENCOUNTER — Ambulatory Visit (INDEPENDENT_AMBULATORY_CARE_PROVIDER_SITE_OTHER): Payer: BC Managed Care – PPO | Admitting: Family Medicine

## 2014-01-13 ENCOUNTER — Encounter: Payer: Self-pay | Admitting: Family Medicine

## 2014-01-13 VITALS — BP 92/70 | HR 62 | Temp 98.2°F | Ht 64.0 in | Wt 187.5 lb

## 2014-01-13 DIAGNOSIS — N189 Chronic kidney disease, unspecified: Secondary | ICD-10-CM

## 2014-01-13 DIAGNOSIS — E7800 Pure hypercholesterolemia, unspecified: Secondary | ICD-10-CM

## 2014-01-13 DIAGNOSIS — L851 Acquired keratosis [keratoderma] palmaris et plantaris: Secondary | ICD-10-CM

## 2014-01-13 DIAGNOSIS — Z23 Encounter for immunization: Secondary | ICD-10-CM

## 2014-01-13 DIAGNOSIS — Z8349 Family history of other endocrine, nutritional and metabolic diseases: Secondary | ICD-10-CM

## 2014-01-13 DIAGNOSIS — R7309 Other abnormal glucose: Secondary | ICD-10-CM

## 2014-01-13 DIAGNOSIS — L853 Xerosis cutis: Secondary | ICD-10-CM

## 2014-01-13 LAB — COMPREHENSIVE METABOLIC PANEL
ALBUMIN: 3.7 g/dL (ref 3.5–5.2)
ALK PHOS: 83 U/L (ref 39–117)
ALT: 19 U/L (ref 0–35)
AST: 23 U/L (ref 0–37)
BUN: 11 mg/dL (ref 6–23)
CO2: 26 mEq/L (ref 19–32)
Calcium: 9.4 mg/dL (ref 8.4–10.5)
Chloride: 106 mEq/L (ref 96–112)
Creatinine, Ser: 1.2 mg/dL (ref 0.4–1.2)
GFR: 49.46 mL/min — ABNORMAL LOW (ref >60.00)
GLUCOSE: 42 mg/dL — AB (ref 70–99)
POTASSIUM: 4.2 meq/L (ref 3.5–5.1)
SODIUM: 139 meq/L (ref 135–145)
TOTAL PROTEIN: 6.8 g/dL (ref 6.0–8.3)
Total Bilirubin: 0.8 mg/dL (ref 0.2–1.2)

## 2014-01-13 LAB — LIPID PANEL
CHOLESTEROL: 240 mg/dL — AB (ref 0–200)
HDL: 46.5 mg/dL (ref >39.00)
LDL Cholesterol: 172 mg/dL — ABNORMAL HIGH (ref 0–99)
NONHDL: 193.5
Total CHOL/HDL Ratio: 5
Triglycerides: 106 mg/dL (ref 0.0–149.0)
VLDL: 21.2 mg/dL (ref 0.0–40.0)

## 2014-01-13 LAB — TSH: TSH: 2.3 u[IU]/mL (ref 0.35–4.50)

## 2014-01-13 LAB — HEMOGLOBIN A1C: HEMOGLOBIN A1C: 5.9 % (ref 4.6–6.5)

## 2014-01-13 NOTE — Patient Instructions (Signed)
Stop at lab on way out.  Continue exercise and healthy eating.  Fat and Cholesterol Control Diet Fat and cholesterol levels in your blood and organs are influenced by your diet. High levels of fat and cholesterol may lead to diseases of the heart, small and large blood vessels, gallbladder, liver, and pancreas. CONTROLLING FAT AND CHOLESTEROL WITH DIET Although exercise and lifestyle factors are important, your diet is key. That is because certain foods are known to raise cholesterol and others to lower it. The goal is to balance foods for their effect on cholesterol and more importantly, to replace saturated and trans fat with other types of fat, such as monounsaturated fat, polyunsaturated fat, and omega-3 fatty acids. On average, a person should consume no more than 15 to 17 g of saturated fat daily. Saturated and trans fats are considered "bad" fats, and they will raise LDL cholesterol. Saturated fats are primarily found in animal products such as meats, butter, and cream. However, that does not mean you need to give up all your favorite foods. Today, there are good tasting, low-fat, low-cholesterol substitutes for most of the things you like to eat. Choose low-fat or nonfat alternatives. Choose round or loin cuts of red meat. These types of cuts are lowest in fat and cholesterol. Chicken (without the skin), fish, veal, and ground [place] breast are great choices. Eliminate fatty meats, such as hot dogs and salami. Even shellfish have little or no saturated fat. Have a 3 oz (85 g) portion when you eat lean meat, poultry, or fish. Trans fats are also called "partially hydrogenated oils." They are oils that have been scientifically manipulated so that they are solid at room temperature resulting in a longer shelf life and improved taste and texture of foods in which they are added. Trans fats are found in stick margarine, some tub margarines, cookies, crackers, and baked goods.  When baking and cooking,  oils are a great substitute for butter. The monounsaturated oils are especially beneficial since it is believed they lower LDL and raise HDL. The oils you should avoid entirely are saturated tropical oils, such as coconut and palm.  Remember to eat a lot from food groups that are naturally free of saturated and trans fat, including fish, fruit, vegetables, beans, grains (barley, rice, couscous, bulgur wheat), and pasta (without cream sauces).  IDENTIFYING FOODS THAT LOWER FAT AND CHOLESTEROL  Soluble fiber may lower your cholesterol. This type of fiber is found in fruits such as apples, vegetables such as broccoli, potatoes, and carrots, legumes such as beans, peas, and lentils, and grains such as barley. Foods fortified with plant sterols (phytosterol) may also lower cholesterol. You should eat at least 2 g per day of these foods for a cholesterol lowering effect.  Read package labels to identify low-saturated fats, trans fat free, and low-fat foods at the supermarket. Select cheeses that have only 2 to 3 g saturated fat per ounce. Use a heart-healthy tub margarine that is free of trans fats or partially hydrogenated oil. When buying baked goods (cookies, crackers), avoid partially hydrogenated oils. Breads and muffins should be made from whole grains (whole-wheat or whole oat flour, instead of "flour" or "enriched flour"). Buy non-creamy canned soups with reduced salt and no added fats.  FOOD PREPARATION TECHNIQUES  Never deep-fry. If you must fry, either stir-fry, which uses very little fat, or use non-stick cooking sprays. When possible, broil, bake, or roast meats, and steam vegetables. Instead of putting butter or margarine on vegetables, use  lemon and herbs, applesauce, and cinnamon (for squash and sweet potatoes). Use nonfat yogurt, salsa, and low-fat dressings for salads.  LOW-SATURATED FAT / LOW-FAT FOOD SUBSTITUTES Meats / Saturated Fat (g)  Avoid: Steak, marbled (3 oz/85 g) / 11 g  Choose:  Steak, lean (3 oz/85 g) / 4 g  Avoid: Hamburger (3 oz/85 g) / 7 g  Choose: Hamburger, lean (3 oz/85 g) / 5 g  Avoid: Ham (3 oz/85 g) / 6 g  Choose: Ham, lean cut (3 oz/85 g) / 2.4 g  Avoid: Chicken, with skin, dark meat (3 oz/85 g) / 4 g  Choose: Chicken, skin removed, dark meat (3 oz/85 g) / 2 g  Avoid: Chicken, with skin, light meat (3 oz/85 g) / 2.5 g  Choose: Chicken, skin removed, light meat (3 oz/85 g) / 1 g Dairy / Saturated Fat (g)  Avoid: Whole milk (1 cup) / 5 g  Choose: Low-fat milk, 2% (1 cup) / 3 g  Choose: Low-fat milk, 1% (1 cup) / 1.5 g  Choose: Skim milk (1 cup) / 0.3 g  Avoid: Hard cheese (1 oz/28 g) / 6 g  Choose: Skim milk cheese (1 oz/28 g) / 2 to 3 g  Avoid: Cottage cheese, 4% fat (1 cup) / 6.5 g  Choose: Low-fat cottage cheese, 1% fat (1 cup) / 1.5 g  Avoid: Ice cream (1 cup) / 9 g  Choose: Sherbet (1 cup) / 2.5 g  Choose: Nonfat frozen yogurt (1 cup) / 0.3 g  Choose: Frozen fruit bar / trace  Avoid: Whipped cream (1 tbs) / 3.5 g  Choose: Nondairy whipped topping (1 tbs) / 1 g Condiments / Saturated Fat (g)  Avoid: Mayonnaise (1 tbs) / 2 g  Choose: Low-fat mayonnaise (1 tbs) / 1 g  Avoid: Butter (1 tbs) / 7 g  Choose: Extra light margarine (1 tbs) / 1 g  Avoid: Coconut oil (1 tbs) / 11.8 g  Choose: Olive oil (1 tbs) / 1.8 g  Choose: Corn oil (1 tbs) / 1.7 g  Choose: Safflower oil (1 tbs) / 1.2 g  Choose: Sunflower oil (1 tbs) / 1.4 g  Choose: Soybean oil (1 tbs) / 2.4 g  Choose: Canola oil (1 tbs) / 1 g Document Released: 04/17/2005 Document Revised: 08/12/2012 Document Reviewed: 07/16/2013 Exit[employer] Patient Information 2015 [place], [place]. This information is not intended to replace advice given to you by your health care provider. Make sure you discuss any questions you have with your health care provider.

## 2014-01-13 NOTE — Assessment & Plan Note (Signed)
Will eval with A1C. Encouraged exercise, weight loss, healthy eating habits.

## 2014-01-13 NOTE — Progress Notes (Signed)
   Subjective:    Patient ID: Rebekah Johnston, female    DOB: Dec 25, 1950, 63 y.o.   MRN: [id number]  HPI 63 year old female presents for 6 month follow up   She is feeling well overall.  She has been working on healthy eating ( drinking allmasaid, almond milk) She has been eating less.  She has been hula hooping. Wt Readings from Last 3 Encounters:  01/13/14 187 lb 8 oz (85.049 kg)  06/30/13 194 lb 8 oz (88.225 kg)  06/20/13 196 lb 4 oz (89.018 kg)   BP Readings from Last 3 Encounters:  01/13/14 92/70  06/30/13 135/83  06/20/13 120/80  no lightheadedness.   Elevated Cholesterol: Poor in June control on no medication... Not at goal < 130.  She never started red yeast rice. She has since lost weight and is exercising more. Lab Results  Component Value Date   CHOL 256* 09/30/2013   HDL 45.00 09/30/2013   LDLCALC 182* 09/30/2013   LDLDIRECT 179.3 06/20/2013   TRIG 147.0 09/30/2013   CHOLHDL 6 09/30/2013  Diet compliance:Moderate  Exercise: Limited  Other complaints:   Pre diabetes: due for review.     Review of Systems  Constitutional: Negative for fever and fatigue.  HENT: Negative for ear pain.   Eyes: Negative for pain.  Respiratory: Negative for chest tightness and shortness of breath.   Cardiovascular: Negative for chest pain, palpitations and leg swelling.  Gastrointestinal: Negative for abdominal pain.  Genitourinary: Negative for dysuria.       Objective:   Physical Exam  Constitutional: Vital signs are normal. She appears well-developed and well-nourished. She is cooperative.  Non-toxic appearance. She does not appear ill. No distress.  HENT:  Head: Normocephalic.  Right Ear: Hearing, tympanic membrane, external ear and ear canal normal. Tympanic membrane is not erythematous, not retracted and not bulging.  Left Ear: Hearing, tympanic membrane, external ear and ear canal normal. Tympanic membrane is not erythematous, not retracted and not bulging.  Nose: No  mucosal edema or rhinorrhea. Right sinus exhibits no maxillary sinus tenderness and no frontal sinus tenderness. Left sinus exhibits no maxillary sinus tenderness and no frontal sinus tenderness.  Mouth/Throat: Uvula is midline, oropharynx is clear and moist and mucous membranes are normal.  Eyes: Conjunctivae, EOM and lids are normal. Pupils are equal, round, and reactive to light. Lids are everted and swept, no foreign bodies found.  Neck: Trachea normal and normal range of motion. Neck supple. Carotid bruit is not present. No mass and no thyromegaly present.  Cardiovascular: Normal rate, regular rhythm, S1 normal, S2 normal, normal heart sounds, intact distal pulses and normal pulses.  Exam reveals no gallop and no friction rub.   No murmur heard. Pulmonary/Chest: Effort normal and breath sounds normal. Not tachypneic. No respiratory distress. She has no decreased breath sounds. She has no wheezes. She has no rhonchi. She has no rales.  Abdominal: Soft. Normal appearance and bowel sounds are normal. There is no tenderness.  Neurological: She is alert.  Skin: Skin is warm, dry and intact. No rash noted.  Psychiatric: Her speech is normal and behavior is normal. Judgment and thought content normal. Her mood appears not anxious. Cognition and memory are normal. She does not exhibit a depressed mood.          Assessment & Plan:

## 2014-01-13 NOTE — Assessment & Plan Note (Addendum)
Due for re-eval. Has not started red yeast rice.  She has lost weight and started hula hooping.  Info provided for low cholesterol diet.

## 2014-01-13 NOTE — Progress Notes (Signed)
Pre visit review using our clinic review tool, if applicable. No additional management support is needed unless otherwise documented below in the visit note. 

## 2014-01-13 NOTE — Assessment & Plan Note (Signed)
Due for re-eval. 

## 2014-10-05 ENCOUNTER — Other Ambulatory Visit: Payer: Self-pay | Admitting: Family Medicine

## 2014-10-20 ENCOUNTER — Emergency Department
Admission: EM | Admit: 2014-10-20 | Discharge: 2014-10-20 | Disposition: A | Discharge status: Home / Self Care | Payer: PPO | Attending: Emergency Medicine | Admitting: Emergency Medicine

## 2014-10-20 ENCOUNTER — Encounter: Payer: Self-pay | Admitting: Emergency Medicine

## 2014-10-20 ENCOUNTER — Other Ambulatory Visit: Payer: Self-pay

## 2014-10-20 ENCOUNTER — Emergency Department: Payer: PPO

## 2014-10-20 DIAGNOSIS — Z79899 Other long term (current) drug therapy: Secondary | ICD-10-CM | POA: Diagnosis not present

## 2014-10-20 DIAGNOSIS — R0789 Other chest pain: Secondary | ICD-10-CM | POA: Insufficient documentation

## 2014-10-20 DIAGNOSIS — Z87891 Personal history of nicotine dependence: Secondary | ICD-10-CM | POA: Insufficient documentation

## 2014-10-20 DIAGNOSIS — Z7951 Long term (current) use of inhaled steroids: Secondary | ICD-10-CM | POA: Insufficient documentation

## 2014-10-20 DIAGNOSIS — J45901 Unspecified asthma with (acute) exacerbation: Secondary | ICD-10-CM | POA: Diagnosis not present

## 2014-10-20 DIAGNOSIS — M549 Dorsalgia, unspecified: Secondary | ICD-10-CM | POA: Diagnosis not present

## 2014-10-20 DIAGNOSIS — R079 Chest pain, unspecified: Secondary | ICD-10-CM

## 2014-10-20 DIAGNOSIS — R51 Headache: Secondary | ICD-10-CM | POA: Diagnosis not present

## 2014-10-20 LAB — BASIC METABOLIC PANEL
Anion gap: 5 (ref 5–15)
BUN: 20 mg/dL (ref 6–20)
CO2: 25 mmol/L (ref 22–32)
Calcium: 9.2 mg/dL (ref 8.9–10.3)
Chloride: 107 mmol/L (ref 101–111)
Creatinine, Ser: 1.22 mg/dL — ABNORMAL HIGH (ref 0.44–1.00)
GFR calc non Af Amer: 45 mL/min — ABNORMAL LOW (ref >60)
GFR, EST AFRICAN AMERICAN: 53 mL/min — AB (ref >60)
Glucose, Bld: 100 mg/dL — ABNORMAL HIGH (ref 65–99)
POTASSIUM: 4 mmol/L (ref 3.5–5.1)
Sodium: 137 mmol/L (ref 135–145)

## 2014-10-20 LAB — CBC
HCT: 41.8 % (ref 35.0–47.0)
HEMOGLOBIN: 13.9 g/dL (ref 12.0–16.0)
MCH: 28.7 pg (ref 26.0–34.0)
MCHC: 33.2 g/dL (ref 32.0–36.0)
MCV: 86.3 fL (ref 80.0–100.0)
Platelets: 238 10*3/uL (ref 150–440)
RBC: 4.84 MIL/uL (ref 3.80–5.20)
RDW: 13 % (ref 11.5–14.5)
WBC: 8 10*3/uL (ref 3.6–11.0)

## 2014-10-20 LAB — TROPONIN I: Troponin I: <0.03 ng/mL (ref <0.031)

## 2014-10-20 LAB — FIBRIN DERIVATIVES D-DIMER (ARMC ONLY): Fibrin derivatives D-dimer (ARMC): 329.91 (ref 0–499)

## 2014-10-20 NOTE — ED Provider Notes (Signed)
[facility] Emergency Department Provider Note  ____________________________________________  Time seen: Approximately 217 AM  I have reviewed the triage vital signs and the nursing notes.   HISTORY  Chief Complaint Chest Pain    HPI Rebekah Johnston is a 64 y.o. female who comes in tonight with chest pain. The patient reports that she went to bed at approximately 10 PM and between 10 and 11 started to have some chest pain. The patient reports that she tried moving around to see if it would go away and considered for a short period of time but it always return. The patient reports that since she's never had this pain before she decided to come in for evaluation. The patient reports that the pain felt like a weight on her chest which is different for her. She also felt as if her heart was in her throat. The patient reports that she took 4 baby aspirin and was given nitroglycerin by EMS. The patient's pain is currently gone. She had some back pain as well but also resolved. The patient reports that she thinks she may have had sweats but was also warm in the house. The patient had an episode of head pain but reports again that is resolved.   Past Medical History  Diagnosis Date  . Osteoarthritis   . Depression   . Asthma     Patient Active Problem List   Diagnosis Date Noted  . RENAL INSUFFICIENCY, CHRONIC 08/20/2008  . HYPERCHOLESTEROLEMIA 05/20/2008  . DEPRESSION 04/16/2008  . ALLERGIC RHINITIS 04/16/2008  . EXERCISE INDUCED ASTHMA 04/16/2008  . OSTEOARTHRITIS 04/16/2008  . PREDIABETES 04/16/2008  . UMBILICAL HERNIORRHAPHY, HX OF 04/16/2008    Past Surgical History  Procedure Laterality Date  . Cholecystectomy    . Tonsillectomy    . Bunionectomy      rt. foot  . Hernia repair      Current Outpatient Rx  Name  Route  Sig  Dispense  Refill  . albuterol (PROVENTIL HFA;VENTOLIN HFA) 108 (90 BASE) MCG/ACT inhaler   Inhalation   Inhale 2 puffs  into the lungs every 6 (six) hours as needed for wheezing.   1 Inhaler   3   . citalopram (CELEXA) 40 MG tablet      TAKE 1 TABLET BY MOUTH EVERY DAY   30 tablet   0     NEEDS TO SCHEDULE ANNUAL PHYSICAL FOR FURTHER REFI ...   . fluticasone (FLOVENT HFA) 110 MCG/ACT inhaler   Inhalation   Inhale 1 puff into the lungs 2 (two) times daily.   3 Inhaler   0     Allergies Review of patient's allergies indicates no known allergies.  Family History  Problem Relation Age of Onset  . Prostate cancer Father   . Dementia Mother   . Diabetes Brother   . Melanoma      Uncle    Social History History  Substance Use Topics  . Smoking status: Former Research scientist (life sciences)  . Smokeless tobacco: Never Used     Comment: smoked socially not everyday thing.  . Alcohol Use: 0.6 oz/week    1 Glasses of wine per week     Comment: occasional once a month    Review of Systems Constitutional: No fever/chills Eyes: No visual changes. ENT: No sore throat. Cardiovascular:  chest pain. Respiratory:  shortness of breath. Gastrointestinal: No abdominal pain.  No nausea, no vomiting.   Genitourinary: Negative for dysuria. Musculoskeletal: back pain. Skin: Negative for rash. Neurological:  headaches  10-point ROS otherwise negative.  ____________________________________________   PHYSICAL EXAM:  VITAL SIGNS: ED Triage Vitals  Enc Vitals Group     BP 10/20/14 0123 150/89 mmHg     Pulse Rate 10/20/14 0123 61     Resp 10/20/14 0123 12     Temp 10/20/14 0123 98.2 F (36.8 C)     Temp Source 10/20/14 0123 Oral     SpO2 10/20/14 0123 98 %     Weight 10/20/14 0123 193 lb 1.6 oz (87.59 kg)     Height 10/20/14 0123 5\' 4"  (1.626 m)     Head Cir --      Peak Flow --      Pain Score 10/20/14 0124 0     Pain Loc --      Pain Edu? --      Excl. in [place]? --     Constitutional: Alert and oriented. Well appearing and in no acute distress. Eyes: Conjunctivae are normal. PERRL. EOMI. Head:  Atraumatic. Nose: No congestion/rhinnorhea. Mouth/Throat: Mucous membranes are moist.  Oropharynx non-erythematous. Cardiovascular: Normal rate, regular rhythm. Grossly normal heart sounds.  Good peripheral circulation. Respiratory: Normal respiratory effort.  No retractions. Lungs CTAB. Gastrointestinal: Soft and nontender. No distention. Positive bowel sounds Genitourinary: Deferred Musculoskeletal: No lower extremity tenderness nor edema.  Neurologic:  Normal speech and language. No gross focal neurologic deficits are appreciated.  Skin:  Skin is warm, dry and intact. No rash noted. Psychiatric: Mood and affect are normal.   ____________________________________________   LABS (all labs ordered are listed, but only abnormal results are displayed)  Labs Reviewed  BASIC METABOLIC PANEL - Abnormal; Notable for the following:    Glucose, Bld 100 (*)    Creatinine, Ser 1.22 (*)    GFR calc non Af Amer 45 (*)    GFR calc Af Amer 53 (*)    All other components within normal limits  TROPONIN I  CBC  TROPONIN I  FIBRIN DERIVATIVES D-DIMER ([facility] ONLY)   ____________________________________________  EKG  ED ECG REPORT I, Loney Hering, the attending physician, personally viewed and interpreted this ECG.   Date: 10/20/2014  EKG Time: 117  Rate: 64  Rhythm: normal sinus rhythm  Axis: Normal  Intervals:none  ST&T Change: None  ____________________________________________  RADIOLOGY  Chest x-ray: No acute cardiopulmonary process ____________________________________________   PROCEDURES  Procedure(s) performed: None  Critical Care performed: No  ____________________________________________   INITIAL IMPRESSION / ASSESSMENT AND PLAN / ED COURSE  Pertinent labs & imaging results that were available during my care of the patient were reviewed by me and considered in my medical decision making (see chart for details).  This is a 64 year old female who comes in  with chest pain. The patient's pain is resolved at this time but we will assess the patient's blood work to determine if the patient has acute coronary syndrome.   ----------------------------------------- 6:01 AM on 10/20/2014 -----------------------------------------  The patient is still pain free at this time and the patient's repeat troponin and d-dimer are negative. I will discharge the patient to home and have her follow-up with cardiology for further evaluation and treatment of her chest pain. ____________________________________________   FINAL CLINICAL IMPRESSION(S) / ED DIAGNOSES  Final diagnoses:  Chest pain      Loney Hering, MD 10/20/14 [id number]

## 2014-10-20 NOTE — Discharge Instructions (Signed)

## 2014-10-20 NOTE — ED Notes (Signed)
Pt to ED from home via EMS c/o centralized chest pain that started around 2300 last night.  Pt states laid down and that's when pain started, tried repositioning but pain never fully went away.  Pt states pain was to left and right chest radiating to back.  Pt states no hx of this pain.  Pt A&Ox4, speaking in complete and coherent sentences, VSS, and in NAD at this time.

## 2014-10-31 ENCOUNTER — Other Ambulatory Visit: Payer: Self-pay | Admitting: Family Medicine

## 2014-11-01 NOTE — Telephone Encounter (Signed)
Please schedule CPE with fasting labs prior with Dr. Diona Browner.  Needs this appointment to continue getting refills on her medications.

## 2014-12-07 ENCOUNTER — Other Ambulatory Visit: Payer: Self-pay | Admitting: Family Medicine

## 2015-03-10 ENCOUNTER — Telehealth: Payer: Self-pay | Admitting: Family Medicine

## 2015-03-10 DIAGNOSIS — R7309 Other abnormal glucose: Secondary | ICD-10-CM

## 2015-03-10 DIAGNOSIS — Z1159 Encounter for screening for other viral diseases: Secondary | ICD-10-CM

## 2015-03-10 DIAGNOSIS — E7800 Pure hypercholesterolemia, unspecified: Secondary | ICD-10-CM

## 2015-03-10 NOTE — Telephone Encounter (Signed)
At time of labs..Please let pt know that I have added hep C testing to their routine labs given CDC recommends screening anyone born between 1945-1965 (higher risk population for various reasons) given it is a dormant virus (for 20-30 years) that later can cause liver cancer and liver cirrhosis. Covered by insurance.  If pt refuses, or has had in past or would to discuss further...please cancel and notify me.   

## 2015-03-10 NOTE — Telephone Encounter (Signed)
-----   Message from Marchia Bond sent at 03/08/2015  2:36 PM EST ----- Regarding: Cpx labs Thurs 11/10 need orders, thanks :-) Please order  future cpx labs for pt's upcoming lab appt. Thanks Aniceto Boss

## 2015-03-11 ENCOUNTER — Other Ambulatory Visit (INDEPENDENT_AMBULATORY_CARE_PROVIDER_SITE_OTHER): Payer: PPO

## 2015-03-11 ENCOUNTER — Other Ambulatory Visit: Payer: Self-pay | Admitting: Family Medicine

## 2015-03-11 DIAGNOSIS — Z1159 Encounter for screening for other viral diseases: Secondary | ICD-10-CM

## 2015-03-11 DIAGNOSIS — R7309 Other abnormal glucose: Secondary | ICD-10-CM | POA: Diagnosis not present

## 2015-03-11 DIAGNOSIS — E7800 Pure hypercholesterolemia, unspecified: Secondary | ICD-10-CM | POA: Diagnosis not present

## 2015-03-11 LAB — COMPREHENSIVE METABOLIC PANEL
ALBUMIN: 4.1 g/dL (ref 3.5–5.2)
ALK PHOS: 82 U/L (ref 39–117)
ALT: 15 U/L (ref 0–35)
AST: 18 U/L (ref 0–37)
BUN: 16 mg/dL (ref 6–23)
CHLORIDE: 103 meq/L (ref 96–112)
CO2: 30 meq/L (ref 19–32)
Calcium: 9.8 mg/dL (ref 8.4–10.5)
Creatinine, Ser: 1.28 mg/dL — ABNORMAL HIGH (ref 0.40–1.20)
GFR: 44.43 mL/min — ABNORMAL LOW (ref >60.00)
GLUCOSE: 87 mg/dL (ref 70–99)
Potassium: 4.6 mEq/L (ref 3.5–5.1)
SODIUM: 138 meq/L (ref 135–145)
Total Bilirubin: 0.5 mg/dL (ref 0.2–1.2)
Total Protein: 6.7 g/dL (ref 6.0–8.3)

## 2015-03-11 LAB — LIPID PANEL
CHOL/HDL RATIO: 5
Cholesterol: 218 mg/dL — ABNORMAL HIGH (ref 0–200)
HDL: 48 mg/dL (ref >39.00)
LDL Cholesterol: 143 mg/dL — ABNORMAL HIGH (ref 0–99)
NONHDL: 169.61
Triglycerides: 131 mg/dL (ref 0.0–149.0)
VLDL: 26.2 mg/dL (ref 0.0–40.0)

## 2015-03-11 LAB — HEMOGLOBIN A1C: Hgb A1c MFr Bld: 5.7 % (ref 4.6–6.5)

## 2015-03-12 ENCOUNTER — Encounter: Payer: Self-pay | Admitting: *Deleted

## 2015-03-12 LAB — HEPATITIS C ANTIBODY: HCV Ab: NEGATIVE

## 2015-03-18 ENCOUNTER — Encounter: Payer: Self-pay | Admitting: Family Medicine

## 2015-03-18 ENCOUNTER — Ambulatory Visit (INDEPENDENT_AMBULATORY_CARE_PROVIDER_SITE_OTHER): Payer: PPO | Admitting: Family Medicine

## 2015-03-18 VITALS — BP 110/64 | HR 62 | Temp 97.7°F | Ht 63.5 in | Wt 195.0 lb

## 2015-03-18 DIAGNOSIS — E7800 Pure hypercholesterolemia, unspecified: Secondary | ICD-10-CM | POA: Diagnosis not present

## 2015-03-18 DIAGNOSIS — R7309 Other abnormal glucose: Secondary | ICD-10-CM | POA: Diagnosis not present

## 2015-03-18 DIAGNOSIS — N183 Chronic kidney disease, stage 3 (moderate): Secondary | ICD-10-CM | POA: Diagnosis not present

## 2015-03-18 DIAGNOSIS — M6530 Trigger finger, unspecified finger: Secondary | ICD-10-CM

## 2015-03-18 DIAGNOSIS — Z7189 Other specified counseling: Secondary | ICD-10-CM | POA: Insufficient documentation

## 2015-03-18 DIAGNOSIS — Z23 Encounter for immunization: Secondary | ICD-10-CM

## 2015-03-18 DIAGNOSIS — N1832 Chronic kidney disease, stage 3b: Secondary | ICD-10-CM

## 2015-03-18 DIAGNOSIS — B372 Candidiasis of skin and nail: Secondary | ICD-10-CM

## 2015-03-18 DIAGNOSIS — Z0000 Encounter for general adult medical examination without abnormal findings: Secondary | ICD-10-CM

## 2015-03-18 DIAGNOSIS — IMO0002 Reserved for concepts with insufficient information to code with codable children: Secondary | ICD-10-CM

## 2015-03-18 MED ORDER — CITALOPRAM HYDROBROMIDE 40 MG PO TABS: 40.0000 mg | ORAL_TABLET | Freq: Every day | ORAL | Status: DC

## 2015-03-18 MED ORDER — NYSTATIN 100000 UNIT/GM EX CREA: 1.0000 "application " | TOPICAL_CREAM | Freq: Two times a day (BID) | CUTANEOUS | Status: DC

## 2015-03-18 NOTE — Patient Instructions (Addendum)
Discuss increasing simvastatin with Dr. Humphrey Rolls at next [place].  Plan shingles vaccine at pharmacy. Look into coverage of TDap.  Use nystatin cream under breasts for yeast infection.  Call if trigger fingers are bothering you.

## 2015-03-18 NOTE — Assessment & Plan Note (Signed)
Treat with nystatin. 

## 2015-03-18 NOTE — Assessment & Plan Note (Signed)
Improved control on simvastatin.. Not yet at goal < 100. Rec increasing med.. Pt will discuss at follow up with cards sinve he started her  on the med.

## 2015-03-18 NOTE — Assessment & Plan Note (Signed)
Improving control. 

## 2015-03-18 NOTE — Addendum Note (Signed)
Addended by: Carter Kitten on: 03/18/2015 04:51 PM   Modules accepted: Orders

## 2015-03-18 NOTE — Progress Notes (Signed)
Pre visit review using our clinic review tool, if applicable. No additional management support is needed unless otherwise documented below in the visit note. 

## 2015-03-18 NOTE — Progress Notes (Signed)
The patient is here for annual wellness exam and preventative care.  I have personally reviewed the Medicare Annual Wellness questionnaire and have noted 1. The patient's medical and social history 2. Their use of alcohol, tobacco or illicit drugs 3. Their current medications and supplements 4. The patient's functional ability including ADL's, fall risks, home safety risks and hearing or visual             impairment. 5. Diet and physical activities 6. Evidence for depression or mood disorders 7.         Updated provider list Cognitive evaluation was performed and recorded on pt medicare questionnaire form. The patients weight, height, BMI and visual acuity have been recorded in the chart  I have made referrals, counseling and provided education to the patient based review of the above and I have provided the pt with a written personalized care plan for preventive services.    She is doing well overall.  She has noted 4th digits on bilateral hands get stuck down,  Has to force fingers up.  Pain when gets stuck.  Left worse than right.  Redness and odor under breast bilaterally. Has applied triple paste.   GERD, well controlled on pantoprazole. No further chest pain.  Elevated Cholesterol:  Started on simvastatin by  Dr. Humphrey [facility] in 11/2014. Not at goal yet < 130. Lab Results  Component Value Date   CHOL 218* 03/11/2015   HDL 48.00 03/11/2015   LDLCALC 143* 03/11/2015   LDLDIRECT 179.3 06/20/2013   TRIG 131.0 03/11/2015   CHOLHDL 5 03/11/2015  Diet compliance:Moderate Exercise: Limited Other complaints:  Prediabetes: improved some.  Lab Results  Component Value Date   HGBA1C 5.7 03/11/2015    CKD stage 3 I: Stable,  last check Cr at 1.28 No HTN, no DM. No family history of kidney issues. Renal US nml 2009.  Followed by nephrology, Dr. Candiss Norse, likely due to atherosclerosis   BP Readings from Last 3 Encounters:  03/18/15 110/64  10/20/14 144/85  01/13/14 92/70      Review of Systems  Constitutional: Negative for fever and fatigue.  HENT: Negative for ear pain.  Eyes: Negative for pain.  Respiratory: Negative for chest tightness and shortness of breath.  Cardiovascular: Negative for chest pain, palpitations and leg swelling.  Gastrointestinal: Negative for abdominal pain.  Genitourinary: Negative for dysuria.       Objective:   Physical Exam  Constitutional: Vital signs are normal. She appears well-developed and well-nourished. She is cooperative. Non-toxic appearance. She does not appear ill. No distress.  HENT:  Head: Normocephalic.  Right Ear: Hearing, tympanic membrane, external ear and ear canal normal.  Left Ear: Hearing, tympanic membrane, external ear and ear canal normal.  Nose: Nose normal.  Eyes: Conjunctivae, EOM and lids are normal. Pupils are equal, round, and reactive to light. Lids are everted and swept, no foreign bodies found.  Neck: Trachea normal and normal range of motion. Neck supple. Carotid bruit is not present. No mass and no thyromegaly present.  Cardiovascular: Normal rate, regular rhythm, S1 normal, S2 normal, normal heart sounds and intact distal pulses. Exam reveals no gallop.  No murmur heard. Pulmonary/Chest: Effort normal and breath sounds normal. No respiratory distress. She has no wheezes. She has no rhonchi. She has no rales.  Abdominal: Soft. Normal appearance and bowel sounds are normal. She exhibits no distension, no fluid wave, no abdominal bruit and no mass. There is no hepatosplenomegaly. There is no tenderness. There is no  rebound, no guarding and no CVA tenderness. No hernia.  Genitourinary: Vagina normal and uterus normal. No breast swelling, tenderness, discharge or bleeding.  There is no rash, tenderness or lesion on the right labia. There is no rash, tenderness or lesion on the left labia. Uterus is not enlarged and not tender.Right adnexum displays no mass, no tenderness and no fullness.  Left adnexum displays no mass, no tenderness and no fullness.   NO PAP PERFORMED. Lymphadenopathy:   She has no cervical adenopathy.   She has no axillary adenopathy.  Neurological: She is alert. She has normal strength. No cranial nerve deficit or sensory deficit.  Skin: Skin is warm, dry and intact. Mild erythema under bilateral breasts.  Psychiatric: Her speech is normal and behavior is normal. Judgment normal. Her mood appears not anxious. Cognition and memory are normal. She does not exhibit a depressed mood.    Bilateral 4th digits triggering, non tender to palp.       Assessment & Plan:  The patient's preventative maintenance and recommended screening tests for an annual wellness exam were reviewed in full today. Brought up to date unless services declined.  Counselled on the importance of diet, exercise, and its role in overall health and mortality. The patient's FH and SH was reviewed, including their home life, tobacco status, and drug and alcohol status.   Vaccines:uptodate with flu,PNA, due for shingles and tdap. Colon:08/2010, sessile polyp, Dr. Fuller Plan... Repeat in 5 years. PAP/DVE: nml pap, neg HPV 2015, no further indicated,  Continue DVE 1-2 years. Mammo: 06/2013, plan every 2 years DEXA: nml 2015, repeat in 5 years       Hep C: neg Refused HIV screen nonsmoker

## 2015-03-18 NOTE — Assessment & Plan Note (Signed)
Stable control. Discussed kidney heath measures.

## 2015-06-20 DIAGNOSIS — R05 Cough: Secondary | ICD-10-CM | POA: Diagnosis not present

## 2015-06-22 DIAGNOSIS — H5213 Myopia, bilateral: Secondary | ICD-10-CM | POA: Diagnosis not present

## 2015-07-08 ENCOUNTER — Encounter: Payer: Self-pay | Admitting: Gastroenterology

## 2015-07-12 DIAGNOSIS — L570 Actinic keratosis: Secondary | ICD-10-CM | POA: Diagnosis not present

## 2015-07-12 DIAGNOSIS — S20412A Abrasion of left back wall of thorax, initial encounter: Secondary | ICD-10-CM | POA: Diagnosis not present

## 2015-10-03 DIAGNOSIS — J0100 Acute maxillary sinusitis, unspecified: Secondary | ICD-10-CM | POA: Diagnosis not present

## 2015-10-28 DIAGNOSIS — Z1283 Encounter for screening for malignant neoplasm of skin: Secondary | ICD-10-CM | POA: Diagnosis not present

## 2015-10-28 DIAGNOSIS — L821 Other seborrheic keratosis: Secondary | ICD-10-CM | POA: Diagnosis not present

## 2015-10-28 DIAGNOSIS — L281 Prurigo nodularis: Secondary | ICD-10-CM | POA: Diagnosis not present

## 2015-10-28 DIAGNOSIS — D485 Neoplasm of uncertain behavior of skin: Secondary | ICD-10-CM | POA: Diagnosis not present

## 2015-10-28 DIAGNOSIS — L570 Actinic keratosis: Secondary | ICD-10-CM | POA: Diagnosis not present

## 2015-10-28 DIAGNOSIS — Z808 Family history of malignant neoplasm of other organs or systems: Secondary | ICD-10-CM | POA: Diagnosis not present

## 2015-11-18 ENCOUNTER — Other Ambulatory Visit: Payer: Self-pay | Admitting: Family Medicine

## 2015-12-14 DIAGNOSIS — H5213 Myopia, bilateral: Secondary | ICD-10-CM | POA: Diagnosis not present

## 2016-01-14 DIAGNOSIS — L309 Dermatitis, unspecified: Secondary | ICD-10-CM | POA: Diagnosis not present

## 2016-01-18 ENCOUNTER — Ambulatory Visit (INDEPENDENT_AMBULATORY_CARE_PROVIDER_SITE_OTHER)
Admission: RE | Admit: 2016-01-18 | Discharge: 2016-01-18 | Disposition: A | Discharge status: Home / Self Care | Payer: PPO | Source: Ambulatory Visit | Attending: Family Medicine | Admitting: Family Medicine

## 2016-01-18 ENCOUNTER — Encounter: Payer: Self-pay | Admitting: Family Medicine

## 2016-01-18 ENCOUNTER — Ambulatory Visit (INDEPENDENT_AMBULATORY_CARE_PROVIDER_SITE_OTHER): Payer: PPO | Admitting: Family Medicine

## 2016-01-18 VITALS — BP 130/80 | HR 57 | Temp 98.6°F | Ht 63.5 in | Wt 196.5 lb

## 2016-01-18 DIAGNOSIS — M5441 Lumbago with sciatica, right side: Secondary | ICD-10-CM

## 2016-01-18 DIAGNOSIS — M545 Low back pain, unspecified: Secondary | ICD-10-CM | POA: Insufficient documentation

## 2016-01-18 DIAGNOSIS — M25561 Pain in right knee: Secondary | ICD-10-CM | POA: Diagnosis not present

## 2016-01-18 DIAGNOSIS — M25562 Pain in left knee: Secondary | ICD-10-CM

## 2016-01-18 DIAGNOSIS — Z23 Encounter for immunization: Secondary | ICD-10-CM

## 2016-01-18 DIAGNOSIS — G8929 Other chronic pain: Secondary | ICD-10-CM | POA: Insufficient documentation

## 2016-01-18 DIAGNOSIS — N183 Chronic kidney disease, stage 3 (moderate): Secondary | ICD-10-CM | POA: Diagnosis not present

## 2016-01-18 DIAGNOSIS — M47816 Spondylosis without myelopathy or radiculopathy, lumbar region: Secondary | ICD-10-CM | POA: Diagnosis not present

## 2016-01-18 DIAGNOSIS — N1832 Chronic kidney disease, stage 3b: Secondary | ICD-10-CM

## 2016-01-18 MED ORDER — PREDNISONE 20 MG PO TABS: ORAL_TABLET | ORAL | 0 refills | Status: DC

## 2016-01-18 NOTE — Patient Instructions (Addendum)
Complete a prednisone taper.  Start home PT.  We will call with X-ray results.  If pain is no resolved in 2 weeks.. Call for referral to [facility] or make a follow up with me for further evaluation.

## 2016-01-18 NOTE — Progress Notes (Signed)
Pre visit review using our clinic review tool, if applicable. No additional management support is needed unless otherwise documented below in the visit note. 

## 2016-01-18 NOTE — Progress Notes (Signed)
Subjective:    Patient ID: Rebekah Johnston, female    DOB: [birth year], 65 y.o.   MRN: [id number]  HPI    65 year old female presents for new onset pain in right knee. Noted first 3-4 days ago, no known injury, noted swelling, no redness.  Pain  7-10/10 on pain scale. Greatest with going sitting to standing , or straightening knee.  She has been doing "planking" Calf feels sore with standing.  Sunday noted numbness in right leg. No weakness.   She has been seeing chiropractor for bilateral hip and low back pain, central. Has improved, but not gone.  She has been walking funny given  Pain in back.. May have injured knee. Planned referral to ortho.   She has not used any medication for pain. She applied ice and elevation in knee.  History of intermittent swelling in knees.. No pain associated. No past knee surgery.   BP Readings from Last 3 Encounters:  01/18/16 130/80  03/18/15 110/64  10/20/14 (!) 144/85     Review of Systems  Constitutional: Negative for fatigue and fever.  HENT: Negative for ear pain.   Eyes: Negative for pain.  Respiratory: Negative for chest tightness and shortness of breath.   Cardiovascular: Negative for chest pain, palpitations and leg swelling.  Gastrointestinal: Negative for abdominal pain.  Genitourinary: Negative for dysuria.  Musculoskeletal: Positive for arthralgias.       Has arthrittis in hands, trigger finger in 4th digit bilaterally.       Objective:   Physical Exam  Constitutional: Vital signs are normal. She appears well-developed and well-nourished. She is cooperative.  Non-toxic appearance. She does not appear ill. No distress.  obese  HENT:  Head: Normocephalic.  Right Ear: Hearing, tympanic membrane, external ear and ear canal normal. Tympanic membrane is not erythematous, not retracted and not bulging.  Left Ear: Hearing, tympanic membrane, external ear and ear canal normal. Tympanic membrane is not erythematous, not  retracted and not bulging.  Nose: No mucosal edema or rhinorrhea. Right sinus exhibits no maxillary sinus tenderness and no frontal sinus tenderness. Left sinus exhibits no maxillary sinus tenderness and no frontal sinus tenderness.  Mouth/Throat: Uvula is midline, oropharynx is clear and moist and mucous membranes are normal.  Eyes: Conjunctivae, EOM and lids are normal. Pupils are equal, round, and reactive to light. Lids are everted and swept, no foreign bodies found.  Neck: Trachea normal and normal range of motion. Neck supple. Carotid bruit is not present. No thyroid mass and no thyromegaly present.  Cardiovascular: Normal rate, regular rhythm, S1 normal, S2 normal, normal heart sounds, intact distal pulses and normal pulses.  Exam reveals no gallop and no friction rub.   No murmur heard. Pulmonary/Chest: Effort normal and breath sounds normal. No tachypnea. No respiratory distress. She has no decreased breath sounds. She has no wheezes. She has no rhonchi. She has no rales.  Abdominal: Soft. Normal appearance and bowel sounds are normal. There is no tenderness.  Musculoskeletal:       Right knee: She exhibits decreased range of motion. She exhibits no swelling, no effusion, no bony tenderness and normal meniscus. Tenderness found. Medial joint line and lateral joint line tenderness noted. No MCL, no LCL and no patellar tendon tenderness noted.       Lumbar back: She exhibits tenderness. She exhibits no bony tenderness.  Slightly positive SLR bilaterally, neg faber's  Neurological: She is alert.  Skin: Skin is warm, dry and intact. No rash  noted.  Psychiatric: Her speech is normal and behavior is normal. Judgment and thought content normal. Her mood appears not anxious. Cognition and memory are normal. She does not exhibit a depressed mood.          Assessment & Plan:

## 2016-01-21 DIAGNOSIS — I340 Nonrheumatic mitral (valve) insufficiency: Secondary | ICD-10-CM | POA: Diagnosis not present

## 2016-01-21 DIAGNOSIS — E782 Mixed hyperlipidemia: Secondary | ICD-10-CM | POA: Diagnosis not present

## 2016-01-28 DIAGNOSIS — L309 Dermatitis, unspecified: Secondary | ICD-10-CM | POA: Diagnosis not present

## 2016-02-18 DIAGNOSIS — M9903 Segmental and somatic dysfunction of lumbar region: Secondary | ICD-10-CM | POA: Diagnosis not present

## 2016-02-18 DIAGNOSIS — M461 Sacroiliitis, not elsewhere classified: Secondary | ICD-10-CM | POA: Diagnosis not present

## 2016-02-18 DIAGNOSIS — M5442 Lumbago with sciatica, left side: Secondary | ICD-10-CM | POA: Diagnosis not present

## 2016-02-18 DIAGNOSIS — M9904 Segmental and somatic dysfunction of sacral region: Secondary | ICD-10-CM | POA: Diagnosis not present

## 2016-02-18 NOTE — Assessment & Plan Note (Signed)
Eval with X-ray. Complete a prednisone taper.  Start home PT.   If pain is no resolved in 2 weeks.. Call for referral to [facility] or make a follow up with me for further evaluation.

## 2016-02-18 NOTE — Assessment & Plan Note (Signed)
AVOID NSAIDS ?

## 2016-02-18 NOTE — Assessment & Plan Note (Signed)
Prednisone will not only help with knee pain but low back inflammation as well from sciatic. Continue home PT.

## 2016-02-22 DIAGNOSIS — M461 Sacroiliitis, not elsewhere classified: Secondary | ICD-10-CM | POA: Diagnosis not present

## 2016-02-22 DIAGNOSIS — M9904 Segmental and somatic dysfunction of sacral region: Secondary | ICD-10-CM | POA: Diagnosis not present

## 2016-02-22 DIAGNOSIS — M9903 Segmental and somatic dysfunction of lumbar region: Secondary | ICD-10-CM | POA: Diagnosis not present

## 2016-02-22 DIAGNOSIS — M5442 Lumbago with sciatica, left side: Secondary | ICD-10-CM | POA: Diagnosis not present

## 2016-02-24 DIAGNOSIS — M9904 Segmental and somatic dysfunction of sacral region: Secondary | ICD-10-CM | POA: Diagnosis not present

## 2016-02-24 DIAGNOSIS — M5442 Lumbago with sciatica, left side: Secondary | ICD-10-CM | POA: Diagnosis not present

## 2016-02-24 DIAGNOSIS — M461 Sacroiliitis, not elsewhere classified: Secondary | ICD-10-CM | POA: Diagnosis not present

## 2016-02-24 DIAGNOSIS — M9903 Segmental and somatic dysfunction of lumbar region: Secondary | ICD-10-CM | POA: Diagnosis not present

## 2016-03-02 DIAGNOSIS — M9903 Segmental and somatic dysfunction of lumbar region: Secondary | ICD-10-CM | POA: Diagnosis not present

## 2016-03-02 DIAGNOSIS — M9904 Segmental and somatic dysfunction of sacral region: Secondary | ICD-10-CM | POA: Diagnosis not present

## 2016-03-02 DIAGNOSIS — M5442 Lumbago with sciatica, left side: Secondary | ICD-10-CM | POA: Diagnosis not present

## 2016-03-02 DIAGNOSIS — M461 Sacroiliitis, not elsewhere classified: Secondary | ICD-10-CM | POA: Diagnosis not present

## 2016-03-10 ENCOUNTER — Other Ambulatory Visit: Payer: Self-pay | Admitting: Family Medicine

## 2016-03-10 ENCOUNTER — Encounter: Payer: Self-pay | Admitting: *Deleted

## 2016-03-10 NOTE — Telephone Encounter (Signed)
Letter mailed to patient asking her to call the office and schedule her Annual Physical.

## 2016-03-30 ENCOUNTER — Telehealth: Payer: Self-pay | Admitting: Family Medicine

## 2016-03-30 ENCOUNTER — Other Ambulatory Visit: Payer: Self-pay

## 2016-03-30 ENCOUNTER — Ambulatory Visit (INDEPENDENT_AMBULATORY_CARE_PROVIDER_SITE_OTHER): Payer: PPO

## 2016-03-30 VITALS — BP 126/84 | HR 65 | Temp 97.8°F | Ht 63.5 in | Wt 196.8 lb

## 2016-03-30 DIAGNOSIS — N183 Chronic kidney disease, stage 3 (moderate): Secondary | ICD-10-CM | POA: Diagnosis not present

## 2016-03-30 DIAGNOSIS — Z1231 Encounter for screening mammogram for malignant neoplasm of breast: Secondary | ICD-10-CM

## 2016-03-30 DIAGNOSIS — N1832 Chronic kidney disease, stage 3b: Secondary | ICD-10-CM

## 2016-03-30 DIAGNOSIS — Z1239 Encounter for other screening for malignant neoplasm of breast: Secondary | ICD-10-CM

## 2016-03-30 DIAGNOSIS — R7309 Other abnormal glucose: Secondary | ICD-10-CM | POA: Diagnosis not present

## 2016-03-30 DIAGNOSIS — Z23 Encounter for immunization: Secondary | ICD-10-CM

## 2016-03-30 DIAGNOSIS — E7800 Pure hypercholesterolemia, unspecified: Secondary | ICD-10-CM | POA: Diagnosis not present

## 2016-03-30 DIAGNOSIS — E2839 Other primary ovarian failure: Secondary | ICD-10-CM

## 2016-03-30 DIAGNOSIS — Z0000 Encounter for general adult medical examination without abnormal findings: Secondary | ICD-10-CM | POA: Diagnosis not present

## 2016-03-30 LAB — COMPREHENSIVE METABOLIC PANEL
ALBUMIN: 4.1 g/dL (ref 3.5–5.2)
ALT: 13 U/L (ref 0–35)
AST: 16 U/L (ref 0–37)
Alkaline Phosphatase: 76 U/L (ref 39–117)
BUN: 17 mg/dL (ref 6–23)
CHLORIDE: 106 meq/L (ref 96–112)
CO2: 29 meq/L (ref 19–32)
CREATININE: 1.19 mg/dL (ref 0.40–1.20)
Calcium: 9.8 mg/dL (ref 8.4–10.5)
GFR: 48.17 mL/min — ABNORMAL LOW (ref >60.00)
Glucose, Bld: 109 mg/dL — ABNORMAL HIGH (ref 70–99)
Potassium: 5.1 mEq/L (ref 3.5–5.1)
SODIUM: 141 meq/L (ref 135–145)
Total Bilirubin: 0.6 mg/dL (ref 0.2–1.2)
Total Protein: 6.9 g/dL (ref 6.0–8.3)

## 2016-03-30 LAB — CBC WITH DIFFERENTIAL/PLATELET
Basophils Absolute: 0 10*3/uL (ref 0.0–0.1)
Basophils Relative: 0.5 % (ref 0.0–3.0)
EOS ABS: 0.1 10*3/uL (ref 0.0–0.7)
Eosinophils Relative: 1.6 % (ref 0.0–5.0)
HCT: 42.6 % (ref 36.0–46.0)
Hemoglobin: 14.4 g/dL (ref 12.0–15.0)
Lymphocytes Relative: 26.5 % (ref 12.0–46.0)
Lymphs Abs: 2.2 10*3/uL (ref 0.7–4.0)
MCHC: 33.8 g/dL (ref 30.0–36.0)
MCV: 86.2 fl (ref 78.0–100.0)
MONO ABS: 0.7 10*3/uL (ref 0.1–1.0)
Monocytes Relative: 8.9 % (ref 3.0–12.0)
Neutro Abs: 5.2 10*3/uL (ref 1.4–7.7)
Neutrophils Relative %: 62.5 % (ref 43.0–77.0)
Platelets: 273 10*3/uL (ref 150.0–400.0)
RBC: 4.94 Mil/uL (ref 3.87–5.11)
RDW: 13.1 % (ref 11.5–15.5)
WBC: 8.3 10*3/uL (ref 4.0–10.5)

## 2016-03-30 LAB — LIPID PANEL
CHOL/HDL RATIO: 4
CHOLESTEROL: 220 mg/dL — AB (ref 0–200)
HDL: 54.2 mg/dL (ref >39.00)
LDL CALC: 148 mg/dL — AB (ref 0–99)
NONHDL: 166.1
Triglycerides: 91 mg/dL (ref 0.0–149.0)
VLDL: 18.2 mg/dL (ref 0.0–40.0)

## 2016-03-30 LAB — HEMOGLOBIN A1C: HEMOGLOBIN A1C: 5.9 % (ref 4.6–6.5)

## 2016-03-30 LAB — VITAMIN D 25 HYDROXY (VIT D DEFICIENCY, FRACTURES): VITD: 23.48 ng/mL — AB (ref 30.00–100.00)

## 2016-03-30 NOTE — Progress Notes (Signed)
Pre visit review using our clinic review tool, if applicable. No additional management support is needed unless otherwise documented below in the visit note. 

## 2016-03-30 NOTE — Telephone Encounter (Signed)
-----   Message from Eustace Pen, LPN sent at [phone]  4:24 PM EST ----- Regarding: Lab Orders 11/30 Please place lab orders, if any. Thank you.

## 2016-03-30 NOTE — Telephone Encounter (Signed)
Patient was here today for AWV. Patient has been out of Celexa for approx. 30 days. Patient was intermittently crying throughout encounter and stated she needed to take this medication. Pending annual exam appt on 04/04/16.

## 2016-03-30 NOTE — Progress Notes (Signed)
Subjective:   Rebekah Johnston is a 65 y.o. female who presents for Medicare Annual (Subsequent) preventive examination.  Review of Systems:  N/A Cardiac Risk Factors include: obesity (BMI >30kg/m2);advanced age (>5men, >45 women);dyslipidemia     Objective:     Vitals: BP 126/84 (BP Location: Left Arm, Patient Position: Sitting, Cuff Size: Normal)   Pulse 65   Temp 97.8 F (36.6 C) (Oral)   Ht 5' 3.5" (1.613 m) Comment: no shoes  Wt 196 lb 12 oz (89.2 kg)   SpO2 99%   BMI 34.31 kg/m   Body mass index is 34.31 kg/m.   Tobacco History  Smoking Status  . Former Smoker  Smokeless Tobacco  . Never Used    Comment: smoked socially not everyday thing.     Counseling given: No   Past Medical History:  Diagnosis Date  . Asthma   . Depression   . Osteoarthritis    Past Surgical History:  Procedure Laterality Date  . BUNIONECTOMY     rt. foot  . CHOLECYSTECTOMY    . HERNIA REPAIR    . TONSILLECTOMY     Family History  Problem Relation Age of Onset  . Prostate cancer Father   . Dementia Mother   . Diabetes Brother   . Melanoma      Uncle   History  Sexual Activity  . Sexual activity: Not on file    Outpatient Encounter Prescriptions as of 03/30/2016  Medication Sig  . acetaminophen (TYLENOL) 325 MG tablet Take 650 mg by mouth every 6 (six) hours as needed.  Marland Kitchen aspirin 81 MG chewable tablet Chew 81 mg by mouth daily.  . cetirizine (ZYRTEC) 10 MG tablet Take 10 mg by mouth daily.  . citalopram (CELEXA) 40 MG tablet TAKE 1 TABLET (40 MG TOTAL) BY MOUTH DAILY.  . fluticasone (FLONASE) 50 MCG/ACT nasal spray Place 2 sprays into both nostrils daily.  . pantoprazole (PROTONIX) 40 MG tablet Take 40 mg by mouth daily.  Marland Kitchen nystatin cream (MYCOSTATIN) Apply 1 application topically 2 (two) times daily. (Patient not taking: Reported on 03/30/2016)  . simvastatin (ZOCOR) 20 MG tablet Take 20 mg by mouth at bedtime.  . [DISCONTINUED] predniSONE (DELTASONE) 20 MG  tablet 3 tabs by mouth daily x 3 days, then 2 tabs by mouth daily x 2 days then 1 tab by mouth daily x 2 days   No facility-administered encounter medications on file as of 03/30/2016.     Activities of Daily Living In your present state of health, do you have any difficulty performing the following activities: 03/30/2016  Hearing? Y  Vision? N  Difficulty concentrating or making decisions? N  Walking or climbing stairs? N  Dressing or bathing? N  Doing errands, shopping? N  Preparing Food and eating ? N  Using the Toilet? N  In the past six months, have you accidently leaked urine? N  Do you have problems with loss of bowel control? N  Managing your Medications? N  Managing your Finances? N  Housekeeping or managing your Housekeeping? N  Some recent data might be hidden    Patient Care Team: Jinny Sanders, MD as PCP - General Grant Fontana, DC as Referring Physician (Chiropractic Medicine)    Assessment:     Hearing Screening   125Hz  250Hz  500Hz  1000Hz  2000Hz  3000Hz  4000Hz  6000Hz  8000Hz   Right ear:   0 0 0  0    Left ear:   0 0 40  0  Vision Screening Comments: Last vision exam in Feb 2017 with Dr.Anita Vin   Exercise Activities and Dietary recommendations Current Exercise Habits: The patient does not participate in regular exercise at present, Exercise limited by: None identified  Goals    . Increase water intake          Starting 03/30/2016, I will attempt to drink at least 8 oz water with each meal and snack daily.       Fall Risk Fall Risk  03/30/2016 03/18/2015  Falls in the past year? No No   Depression Screen PHQ 2/9 Scores 03/30/2016 03/18/2015  PHQ - 2 Score 0 0     Cognitive Function MMSE - Mini Mental State Exam 03/30/2016  Orientation to time 5  Orientation to Place 5  Registration 3  Attention/ Calculation 0  Recall 3  Language- name 2 objects 0  Language- repeat 1  Language- follow 3 step command 3  Language- read & follow direction 0    Write a sentence 0  Copy design 0  Total score 20     PLEASE NOTE: A Mini-Cog screen was completed. Maximum score is 20. A value of 0 denotes this part of Folstein MMSE was not completed or the patient failed this part of the Mini-Cog screening.   Mini-Cog Screening Orientation to Time - Max 5 pts Orientation to Place - Max 5 pts Registration - Max 3 pts Recall - Max 3 pts Language Repeat - Max 1 pts Language Follow 3 Step Command - Max 3 pts     Immunization History  Administered Date(s) Administered  . Influenza Split 05/07/2013  . Influenza Whole 01/30/2008  . Influenza,inj,Quad PF,36+ Mos 01/13/2014, 03/18/2015, 01/18/2016  . Pneumococcal Conjugate-13 03/18/2015  . Pneumococcal Polysaccharide-23 03/30/2016  . Td 02/04/2002   Screening Tests Health Maintenance  Topic Date Due  . MAMMOGRAM  07/22/2017 (Originally 07/24/2015)  . COLONOSCOPY  09/06/2020 (Originally 09/08/2015)  . TETANUS/TDAP  05/01/2025  . INFLUENZA VACCINE  Completed  . DEXA SCAN  Completed  . ZOSTAVAX  Addressed  . Hepatitis C Screening  Completed  . PNA vac Low Risk Adult  Completed      Plan:     I have personally reviewed and addressed the Medicare Annual Wellness questionnaire and have noted the following in the patient's chart:  A. Medical and social history B. Use of alcohol, tobacco or illicit drugs  C. Current medications and supplements D. Functional ability and status E.  Nutritional status F.  Physical activity G. Advance directives H. List of other physicians I.  Hospitalizations, surgeries, and ER visits in previous 12 months J.  [place] to include hearing, vision, cognitive, depression L. Referrals and appointments - none  In addition, I have reviewed and discussed with patient certain preventive protocols, quality metrics, and best practice recommendations. A written personalized care plan for preventive services as well as general preventive health recommendations  were provided to patient.  See attached scanned questionnaire for additional information.   Signed,   Lindell Noe, MHA, BS, LPN Health Coach

## 2016-03-30 NOTE — Patient Instructions (Signed)
Ms. Bakken , Thank you for taking time to come for your Medicare Wellness Visit. I appreciate your ongoing commitment to your health goals. Please review the following plan we discussed and let me know if I can assist you in the future.   These are the goals we discussed: Goals    . Increase water intake          Starting 03/30/2016, I will attempt to drink at least 8 oz water with each meal and snack daily.        This is a list of the screening recommended for you and due dates:  Health Maintenance  Topic Date Due  . Mammogram  07/22/2017*  . Colon Cancer Screening  09/06/2020*  . Tetanus Vaccine  05/01/2025  . Flu Shot  Completed  . DEXA scan (bone density measurement)  Completed  . Shingles Vaccine  Addressed  .  Hepatitis C: One time screening is recommended by Center for Disease Control  (CDC) for  adults born from [birth year] through 1966.   Completed  . Pneumonia vaccines  Completed  *Topic was postponed. The date shown is not the original due date.   Preventive Care for Adults  A healthy lifestyle and preventive care can promote health and wellness. Preventive health guidelines for adults include the following key practices.  . A routine yearly physical is a good way to check with your health care provider about your health and preventive screening. It is a chance to share any concerns and updates on your health and to receive a thorough exam.  . Visit your dentist for a routine exam and preventive care every 6 months. Brush your teeth twice a day and floss once a day. Good oral hygiene prevents tooth decay and gum disease.  . The frequency of eye exams is based on your age, health, family medical history, use  of contact lenses, and other factors. Follow your health care provider's ecommendations for frequency of eye exams.  . Eat a healthy diet. Foods like vegetables, fruits, whole grains, low-fat dairy products, and lean protein foods contain the nutrients you need without  too many calories. Decrease your intake of foods high in solid fats, added sugars, and salt. Eat the right amount of calories for you. Get information about a proper diet from your health care provider, if necessary.  . Regular physical exercise is one of the most important things you can do for your health. Most adults should get at least 150 minutes of moderate-intensity exercise (any activity that increases your heart rate and causes you to sweat) each week. In addition, most adults need muscle-strengthening exercises on 2 or more days a week.  Silver Sneakers may be a benefit available to you. To determine eligibility, you may visit the website: www.silversneakers.com or contact program at 561-733-1905 Mon-Fri between 8AM-8PM.   . Maintain a healthy weight. The body mass index (BMI) is a screening tool to identify possible weight problems. It provides an estimate of body fat based on height and weight. Your health care provider can find your BMI and can help you achieve or maintain a healthy weight.   For adults 20 years and older: ? A BMI below 18.5 is considered underweight. ? A BMI of 18.5 to 24.9 is normal. ? A BMI of 25 to 29.9 is considered overweight. ? A BMI of 30 and above is considered obese.   . Maintain normal blood lipids and cholesterol levels by exercising and minimizing your intake of saturated  fat. Eat a balanced diet with plenty of fruit and vegetables. Blood tests for lipids and cholesterol should begin at age 69 and be repeated every 5 years. If your lipid or cholesterol levels are high, you are over 50, or you are at high risk for heart disease, you may need your cholesterol levels checked more frequently. Ongoing high lipid and cholesterol levels should be treated with medicines if diet and exercise are not working.  . If you smoke, find out from your health care provider how to quit. If you do not use tobacco, please do not start.  . If you choose to drink alcohol,  please do not consume more than 2 drinks per day. One drink is considered to be 12 ounces (355 mL) of beer, 5 ounces (148 mL) of wine, or 1.5 ounces (44 mL) of liquor.  . If you are 70-57 years old, ask your health care provider if you should take aspirin to prevent strokes.  . Use sunscreen. Apply sunscreen liberally and repeatedly throughout the day. You should seek shade when your shadow is shorter than you. Protect yourself by wearing long sleeves, pants, a wide-brimmed hat, and sunglasses year round, whenever you are outdoors.  . Once a month, do a whole body skin exam, using a mirror to look at the skin on your back. Tell your health care provider of new moles, moles that have irregular borders, moles that are larger than a pencil eraser, or moles that have changed in shape or color.

## 2016-03-30 NOTE — Progress Notes (Signed)
PCP notes:   Health maintenance:  Mammogram - order generated PPSV23 - administered Tetanus - administered at pharmacy Shingles - administered at pharmacy Colonoscopy - pt wants to discuss referral with PCP  Abnormal screenings:   Hearing- failed  Patient concerns:   Pt requested refill of Celexa. Request sent to PCP.   Nurse concerns:  None  Next PCP appt:   04/04/16 @ 0915

## 2016-03-31 MED ORDER — CITALOPRAM HYDROBROMIDE 40 MG PO TABS: 40.0000 mg | ORAL_TABLET | Freq: Every day | ORAL | 1 refills | Status: DC

## 2016-03-31 NOTE — Progress Notes (Signed)
I reviewed health advisor's note, was available for consultation, and agree with documentation and plan.  Amy Bedsole, MD [facility] [place]  

## 2016-04-04 ENCOUNTER — Encounter: Payer: Self-pay | Admitting: Gastroenterology

## 2016-04-04 ENCOUNTER — Ambulatory Visit (INDEPENDENT_AMBULATORY_CARE_PROVIDER_SITE_OTHER): Payer: PPO | Admitting: Family Medicine

## 2016-04-04 ENCOUNTER — Encounter: Payer: Self-pay | Admitting: Family Medicine

## 2016-04-04 VITALS — BP 124/74 | HR 61 | Temp 98.4°F | Ht 63.5 in | Wt 197.8 lb

## 2016-04-04 DIAGNOSIS — R7309 Other abnormal glucose: Secondary | ICD-10-CM | POA: Diagnosis not present

## 2016-04-04 DIAGNOSIS — K219 Gastro-esophageal reflux disease without esophagitis: Secondary | ICD-10-CM

## 2016-04-04 DIAGNOSIS — N183 Chronic kidney disease, stage 3 (moderate): Secondary | ICD-10-CM | POA: Diagnosis not present

## 2016-04-04 DIAGNOSIS — F331 Major depressive disorder, recurrent, moderate: Secondary | ICD-10-CM

## 2016-04-04 DIAGNOSIS — Z1211 Encounter for screening for malignant neoplasm of colon: Secondary | ICD-10-CM

## 2016-04-04 DIAGNOSIS — E7800 Pure hypercholesterolemia, unspecified: Secondary | ICD-10-CM | POA: Diagnosis not present

## 2016-04-04 DIAGNOSIS — N1832 Chronic kidney disease, stage 3b: Secondary | ICD-10-CM

## 2016-04-04 MED ORDER — NYSTATIN 100000 UNIT/GM EX CREA: 1.0000 "application " | TOPICAL_CREAM | Freq: Two times a day (BID) | CUTANEOUS | 0 refills | Status: DC

## 2016-04-04 NOTE — Assessment & Plan Note (Signed)
Good control

## 2016-04-04 NOTE — Assessment & Plan Note (Signed)
Stable on protonix and trigger avoidance.

## 2016-04-04 NOTE — Progress Notes (Signed)
Subjective:    Patient ID: Rebekah Johnston, female    DOB: 26-Apr-1951, 65 y.o.   MRN: [id number]  HPI 65 year old  Earlier she saw Candis Musa, LPN for medicare wellness. Note will be reviewed in detail.   she needs refill of nystatin cream for intertrigo under breasts.. Using off and on.  Elevated Cholesterol:   Moderate risk. No longer on simvastatin 20 mg daily Lab Results  Component Value Date   CHOL 220 (H) 03/30/2016   HDL 54.20 03/30/2016   LDLCALC 148 (H) 03/30/2016   LDLDIRECT 179.3 06/20/2013   TRIG 91.0 03/30/2016   CHOLHDL 4 03/30/2016  Muscle aches:   Side effects of pain in legs Diet compliance: poor Exercise: Plans to get back to silver sneaker. Other complaints:   GERD: stable control on protonix.  Prediabetes:  Lab Results  Component Value Date   HGBA1C 5.9 03/30/2016     Major depression, recurrent: She is doing much better on celexa even in last week.  CKD, stage 3 GFR 48   Vit D low  Social History /Family History/Past Medical History reviewed and updated if needed.  Review of Systems  Constitutional: Negative for fatigue and fever.  HENT: Negative for congestion.   Eyes: Negative for pain.  Respiratory: Negative for cough and shortness of breath.   Cardiovascular: Negative for chest pain, palpitations and leg swelling.  Gastrointestinal: Negative for abdominal pain.  Genitourinary: Negative for dysuria and vaginal bleeding.  Musculoskeletal: Positive for arthralgias.       Joint stiffness  Neurological: Negative for syncope, light-headedness and headaches.  Psychiatric/Behavioral: Negative for dysphoric mood.       Objective:   Physical Exam  Constitutional: Vital signs are normal. She appears well-developed and well-nourished. She is cooperative.  Non-toxic appearance. She does not appear ill. No distress.  obese  HENT:  Head: Normocephalic.  Right Ear: Hearing, tympanic membrane, external ear and ear canal normal.  Left Ear:  Hearing, tympanic membrane, external ear and ear canal normal.  Nose: Nose normal.  Eyes: Conjunctivae, EOM and lids are normal. Pupils are equal, round, and reactive to light. Lids are everted and swept, no foreign bodies found.  Neck: Trachea normal and normal range of motion. Neck supple. Carotid bruit is not present. No thyroid mass and no thyromegaly present.  Cardiovascular: Normal rate, regular rhythm, S1 normal, S2 normal, normal heart sounds and intact distal pulses.  Exam reveals no gallop.   No murmur heard. Pulmonary/Chest: Effort normal and breath sounds normal. No respiratory distress. She has no wheezes. She has no rhonchi. She has no rales.  Abdominal: Soft. Normal appearance and bowel sounds are normal. She exhibits no distension, no fluid wave, no abdominal bruit and no mass. There is no hepatosplenomegaly. There is no tenderness. There is no rebound, no guarding and no CVA tenderness. No hernia.  Lymphadenopathy:    She has no cervical adenopathy.    She has no axillary adenopathy.  Neurological: She is alert. She has normal strength. No cranial nerve deficit or sensory deficit.  Skin: Skin is warm, dry and intact. No rash noted.  Psychiatric: Her speech is normal and behavior is normal. Judgment normal. Her mood appears not anxious. Cognition and memory are normal. She does not exhibit a depressed mood.          Assessment & Plan:  The patient's preventative maintenance and recommended screening tests for an annual wellness exam were reviewed in full today. Brought up to date  unless services declined.  Counselled on the importance of diet, exercise, and its role in overall health and mortality. The patient's FH and SH was reviewed, including their home life, tobacco status, and drug and alcohol status.   Vaccines:uptodate Pap/DVE:  nml pap, neg HPV 2015, no further indicated,  Continue DVE 1-2 years. Mammo ordered. Bone Density nml 2015, repeat in 5 years Colon:  08/2010, sessile polyp, Dr. Fuller Plan... Repeat in 5 years. Smoking Status: nonsmoker  Hep C: done    Per   Health Coach note Mammogram - order generated PPSV23 - administered Tetanus - administered at pharmacy Shingles - administered at pharmacy Colonoscopy - pt wants to discuss referral with PCP  Abnormal screenings:  Hearing- failed

## 2016-04-04 NOTE — Assessment & Plan Note (Signed)
Improved control back on celexa.

## 2016-04-04 NOTE — Patient Instructions (Addendum)
Work on exercise, weight loss, healthy eating habits ( low carb and low cholesterol  diet). Start vit D 400 IU unit 1-2 times daily.  Stop at front desk to set up colonoscopy.

## 2016-04-04 NOTE — Assessment & Plan Note (Addendum)
Intolerance of stain. She refuses low dose of another... Will work aggressively on diet.

## 2016-04-04 NOTE — Progress Notes (Signed)
Pre visit review using our clinic review tool, if applicable. No additional management support is needed unless otherwise documented below in the visit note. 

## 2016-04-04 NOTE — Assessment & Plan Note (Signed)
GFR improved some from last eval. GFR 48.

## 2016-04-28 ENCOUNTER — Ambulatory Visit: Payer: PPO

## 2016-04-28 ENCOUNTER — Other Ambulatory Visit: Payer: PPO

## 2016-05-09 ENCOUNTER — Ambulatory Visit
Admission: EM | Admit: 2016-05-09 | Discharge: 2016-05-09 | Disposition: A | Discharge status: Home / Self Care | Payer: PPO | Attending: Family Medicine | Admitting: Family Medicine

## 2016-05-09 ENCOUNTER — Encounter: Payer: Self-pay | Admitting: Emergency Medicine

## 2016-05-09 DIAGNOSIS — J40 Bronchitis, not specified as acute or chronic: Secondary | ICD-10-CM

## 2016-05-09 DIAGNOSIS — R05 Cough: Secondary | ICD-10-CM

## 2016-05-09 DIAGNOSIS — R059 Cough, unspecified: Secondary | ICD-10-CM

## 2016-05-09 LAB — RAPID STREP SCREEN (MED CTR MEBANE ONLY): STREPTOCOCCUS, GROUP A SCREEN (DIRECT): NEGATIVE

## 2016-05-09 LAB — RAPID INFLUENZA A&B ANTIGENS (ARMC ONLY): INFLUENZA B (ARMC): NEGATIVE

## 2016-05-09 LAB — RAPID INFLUENZA A&B ANTIGENS: Influenza A (ARMC): NEGATIVE

## 2016-05-09 MED ORDER — AMOXICILLIN-POT CLAVULANATE 500-125 MG PO TABS: 1.0000 | ORAL_TABLET | Freq: Three times a day (TID) | ORAL | 0 refills | Status: DC

## 2016-05-09 MED ORDER — HYDROCOD POLST-CPM POLST ER 10-8 MG/5ML PO SUER: 5.0000 mL | Freq: Every evening | ORAL | 0 refills | Status: DC | PRN

## 2016-05-09 NOTE — ED Triage Notes (Signed)
Patient c/o sore throat, cough, and chest congestion since last Wed.  Patient denies fevers.  Patient reports bodyaches.

## 2016-05-09 NOTE — ED Provider Notes (Signed)
[facility]    CSN: [id number] Arrival date & time: 05/09/16  1922     History   Chief Complaint Chief Complaint  Patient presents with  . Cough    HPI Rebekah Johnston is a 66 y.o. female.   The history is provided by the patient.  Cough  Associated symptoms: sore throat   Associated symptoms: no fever, no headaches, no myalgias and no wheezing   URI  Presenting symptoms: congestion, cough, fatigue and sore throat   Presenting symptoms: no fever   Severity:  Moderate Onset quality:  Sudden Duration:  7 days Timing:  Constant Progression:  Worsening Chronicity:  New Relieved by:  Nothing Ineffective treatments:  OTC medications Associated symptoms: no headaches, no myalgias, no sinus pain and no wheezing   Risk factors: being elderly, chronic kidney disease and sick contacts   Risk factors: no chronic cardiac disease, no chronic respiratory disease, no diabetes mellitus, no immunosuppression, no recent illness and no recent travel     Past Medical History:  Diagnosis Date  . Asthma   . Depression   . Osteoarthritis     Patient Active Problem List   Diagnosis Date Noted  . Major depressive disorder, recurrent episode, moderate ([place]) 04/04/2016  . GERD (gastroesophageal reflux disease) 04/04/2016  . Low back pain 01/18/2016  . Right knee pain 01/18/2016  . Counseling regarding end of life decision making 03/18/2015  . Chronic kidney disease (CKD) stage G3b/A1, moderately decreased glomerular filtration rate (GFR) between 30-44 mL/min/1.73 square meter and albuminuria creatinine ratio less than 30 mg/g 08/20/2008  . HYPERCHOLESTEROLEMIA 05/20/2008  . ALLERGIC RHINITIS 04/16/2008  . EXERCISE INDUCED ASTHMA 04/16/2008  . OSTEOARTHRITIS 04/16/2008  . PREDIABETES 04/16/2008  . UMBILICAL HERNIORRHAPHY, HX OF 04/16/2008    Past Surgical History:  Procedure Laterality Date  . BUNIONECTOMY     rt. foot  . CHOLECYSTECTOMY    . HERNIA REPAIR    .  TONSILLECTOMY      OB History    No data available       Home Medications    Prior to Admission medications   Medication Sig Start Date End Date Taking? Authorizing Provider  acetaminophen (TYLENOL) 325 MG tablet Take 650 mg by mouth every 6 (six) hours as needed.    Historical Provider, MD  amoxicillin-clavulanate (AUGMENTIN) 500-125 MG tablet Take 1 tablet (500 mg total) by mouth 3 (three) times daily. 05/09/16   Norval Gable, MD  aspirin 81 MG chewable tablet Chew 81 mg by mouth daily.    Historical Provider, MD  cetirizine (ZYRTEC) 10 MG tablet Take 10 mg by mouth daily.    Historical Provider, MD  chlorpheniramine-HYDROcodone (TUSSIONEX PENNKINETIC ER) 10-8 MG/5ML SUER Take 5 mLs by mouth at bedtime as needed. 05/09/16   Norval Gable, MD  citalopram (CELEXA) 40 MG tablet Take 1 tablet (40 mg total) by mouth daily. 03/31/16   Amy Cletis [place], MD  fluticasone (FLONASE) 50 MCG/ACT nasal spray Place 2 sprays into both nostrils daily.    Historical Provider, MD  nystatin cream (MYCOSTATIN) Apply 1 application topically 2 (two) times daily. 04/04/16   Amy Cletis [place], MD  pantoprazole (PROTONIX) 40 MG tablet Take 40 mg by mouth daily. 02/16/15   Historical Provider, MD    Family History Family History  Problem Relation Age of Onset  . Prostate cancer Father   . Dementia Mother   . Diabetes Brother   . Melanoma      Uncle  Social History Social History  Substance Use Topics  . Smoking status: Former Research scientist (life sciences)  . Smokeless tobacco: Never Used     Comment: smoked socially not everyday thing.  . Alcohol use 0.6 oz/week    1 Glasses of wine per week     Comment: occasional once a month     Allergies   Patient has no known allergies.   Review of Systems Review of Systems  Constitutional: Positive for fatigue. Negative for fever.  HENT: Positive for congestion and sore throat. Negative for sinus pain.   Respiratory: Positive for cough. Negative for wheezing.   Musculoskeletal:  Negative for myalgias.  Neurological: Negative for headaches.     Physical Exam Triage Vital Signs ED Triage Vitals  Enc Vitals Group     BP 05/09/16 2029 128/73     Pulse Rate 05/09/16 2029 73     Resp 05/09/16 2029 16     Temp 05/09/16 2029 98.6 F (37 C)     Temp Source 05/09/16 2029 Oral     SpO2 05/09/16 2029 98 %     Weight 05/09/16 2027 196 lb (88.9 kg)     Height 05/09/16 2027 5\' 4"  (1.626 m)     Head Circumference --      Peak Flow --      Pain Score 05/09/16 2028 0     Pain Loc --      Pain Edu? --      Excl. in [place]? --    No data found.   Updated Vital Signs BP 128/73 (BP Location: Left Arm)   Pulse 73   Temp 98.6 F (37 C) (Oral)   Resp 16   Ht 5\' 4"  (1.626 m)   Wt 196 lb (88.9 kg)   SpO2 98%   BMI 33.64 kg/m   Visual Acuity Right Eye Distance:   Left Eye Distance:   Bilateral Distance:    Right Eye Near:   Left Eye Near:    Bilateral Near:     Physical Exam  Constitutional: She appears well-developed and well-nourished. No distress.  HENT:  Head: Normocephalic and atraumatic.  Right Ear: Tympanic membrane, external ear and ear canal normal.  Left Ear: Tympanic membrane, external ear and ear canal normal.  Nose: Mucosal edema and rhinorrhea present. No nose lacerations, sinus tenderness, nasal deformity, septal deviation or nasal septal hematoma. No epistaxis.  No foreign bodies. Right sinus exhibits no maxillary sinus tenderness and no frontal sinus tenderness. Left sinus exhibits no maxillary sinus tenderness and no frontal sinus tenderness.  Mouth/Throat: Uvula is midline, oropharynx is clear and moist and mucous membranes are normal. No oropharyngeal exudate.  Eyes: Conjunctivae and EOM are normal. Pupils are equal, round, and reactive to light. Right eye exhibits no discharge. Left eye exhibits no discharge. No scleral icterus.  Neck: Normal range of motion. Neck supple. No thyromegaly present.  Cardiovascular: Normal rate, regular rhythm and  normal heart sounds.   Pulmonary/Chest: Effort normal. No respiratory distress. She has no wheezes. She has rales (right base).  Lymphadenopathy:    She has no cervical adenopathy.  Skin: She is not diaphoretic.  Nursing note and vitals reviewed.    UC Treatments / Results  Labs (all labs ordered are listed, but only abnormal results are displayed) Labs Reviewed  RAPID STREP SCREEN (NOT AT [facility])  RAPID INFLUENZA A&B ANTIGENS ([facility] ONLY)  CULTURE, GROUP A STREP [facility])    EKG  EKG Interpretation None  Radiology No results found.  Procedures Procedures (including critical care time)  Medications Ordered in UC Medications - No data to display   Initial Impression / Assessment and Plan / UC Course  I have reviewed the triage vital signs and the nursing notes.  Pertinent labs & imaging results that were available during my care of the patient were reviewed by me and considered in my medical decision making (see chart for details).  Clinical Course       Final Clinical Impressions(s) / UC Diagnoses   Final diagnoses:  Cough  Bronchitis    New Prescriptions Discharge Medication List as of 05/09/2016  9:05 PM    START taking these medications   Details  amoxicillin-clavulanate (AUGMENTIN) 500-125 MG tablet Take 1 tablet (500 mg total) by mouth 3 (three) times daily., Starting Tue 05/09/2016, Normal    chlorpheniramine-HYDROcodone (TUSSIONEX PENNKINETIC ER) 10-8 MG/5ML SUER Take 5 mLs by mouth at bedtime as needed., Starting Tue 05/09/2016, Normal       1. diagnosis reviewed with patient 2. rx as per orders above; reviewed possible side effects, interactions, risks and benefits  3. Recommend supportive treatment with rest, fluids 4. Follow-up prn if symptoms worsen or don't improve   Norval Gable, MD 05/09/16 2110

## 2016-05-12 LAB — CULTURE, GROUP A STREP (THRC)

## 2016-05-15 ENCOUNTER — Ambulatory Visit
Admission: RE | Admit: 2016-05-15 | Discharge: 2016-05-15 | Disposition: A | Discharge status: Home / Self Care | Payer: PPO | Source: Ambulatory Visit | Attending: Family Medicine | Admitting: Family Medicine

## 2016-05-15 DIAGNOSIS — Z1239 Encounter for other screening for malignant neoplasm of breast: Secondary | ICD-10-CM

## 2016-05-15 DIAGNOSIS — Z1231 Encounter for screening mammogram for malignant neoplasm of breast: Secondary | ICD-10-CM | POA: Diagnosis not present

## 2016-05-15 DIAGNOSIS — E2839 Other primary ovarian failure: Secondary | ICD-10-CM

## 2016-05-15 DIAGNOSIS — M8589 Other specified disorders of bone density and structure, multiple sites: Secondary | ICD-10-CM | POA: Diagnosis not present

## 2016-05-15 DIAGNOSIS — Z780 Asymptomatic menopausal state: Secondary | ICD-10-CM | POA: Diagnosis not present

## 2016-05-16 ENCOUNTER — Encounter: Payer: Self-pay | Admitting: Family Medicine

## 2016-05-16 DIAGNOSIS — M8580 Other specified disorders of bone density and structure, unspecified site: Secondary | ICD-10-CM | POA: Insufficient documentation

## 2016-05-23 ENCOUNTER — Ambulatory Visit (AMBULATORY_SURGERY_CENTER): Payer: Self-pay

## 2016-05-23 VITALS — Ht 63.5 in | Wt 198.6 lb

## 2016-05-23 DIAGNOSIS — Z86010 Personal history of colonic polyps: Secondary | ICD-10-CM

## 2016-05-23 MED ORDER — NA SULFATE-K SULFATE-MG SULF 17.5-3.13-1.6 GM/177ML PO SOLN: ORAL | 0 refills | Status: DC

## 2016-05-23 NOTE — Progress Notes (Signed)
Per pt, no allergies to soy or egg products.Pt not taking any weight loss meds or using  O2 at home. 

## 2016-05-26 ENCOUNTER — Encounter: Payer: Self-pay | Admitting: Gastroenterology

## 2016-06-06 ENCOUNTER — Ambulatory Visit (AMBULATORY_SURGERY_CENTER): Payer: PPO | Admitting: Gastroenterology

## 2016-06-06 ENCOUNTER — Encounter: Payer: Self-pay | Admitting: Gastroenterology

## 2016-06-06 VITALS — BP 135/75 | HR 69 | Temp 98.4°F | Resp 18 | Ht 63.5 in | Wt 198.0 lb

## 2016-06-06 DIAGNOSIS — D127 Benign neoplasm of rectosigmoid junction: Secondary | ICD-10-CM | POA: Diagnosis not present

## 2016-06-06 DIAGNOSIS — D124 Benign neoplasm of descending colon: Secondary | ICD-10-CM | POA: Diagnosis not present

## 2016-06-06 DIAGNOSIS — Z86010 Personal history of colonic polyps: Secondary | ICD-10-CM | POA: Diagnosis not present

## 2016-06-06 DIAGNOSIS — D128 Benign neoplasm of rectum: Secondary | ICD-10-CM

## 2016-06-06 DIAGNOSIS — D123 Benign neoplasm of transverse colon: Secondary | ICD-10-CM

## 2016-06-06 DIAGNOSIS — D129 Benign neoplasm of anus and anal canal: Secondary | ICD-10-CM

## 2016-06-06 DIAGNOSIS — K219 Gastro-esophageal reflux disease without esophagitis: Secondary | ICD-10-CM | POA: Diagnosis not present

## 2016-06-06 MED ORDER — SODIUM CHLORIDE 0.9 % IV SOLN: 500.0000 mL | INTRAVENOUS | Status: DC

## 2016-06-06 NOTE — Op Note (Signed)
[facility] Patient Name: Rebekah Johnston Procedure Date: 06/06/2016 8:03 AM MRN: [id number] Endoscopist: Ladene Artist , MD Age: 66 Referring MD:  Date of Birth: 1951/01/08 Gender: Female Account #: [id number] Procedure:                Colonoscopy Indications:              High risk colon cancer surveillance: Personal                            history of colonic polyps Medicines:                Monitored Anesthesia Care Procedure:                Pre-Anesthesia Assessment:                           - Prior to the procedure, a History and Physical                            was performed, and patient medications and                            allergies were reviewed. The patient's tolerance of                            previous anesthesia was also reviewed. The risks                            and benefits of the procedure and the sedation                            options and risks were discussed with the patient.                            All questions were answered, and informed consent                            was obtained. Prior Anticoagulants: The patient has                            taken no previous anticoagulant or antiplatelet                            agents. ASA Grade Assessment: II - A patient with                            mild systemic disease. After reviewing the risks                            and benefits, the patient was deemed in                            satisfactory condition to undergo the procedure.  After obtaining informed consent, the colonoscope                            was passed under direct vision. Throughout the                            procedure, the patient's blood pressure, pulse, and                            oxygen saturations were monitored continuously. The                            Model PCF-[id number]) scope was introduced                            through the anus and advanced to  the the cecum,                            identified by appendiceal orifice and ileocecal                            valve. The ileocecal valve, appendiceal orifice,                            and rectum were photographed. The quality of the                            bowel preparation was excellent. The colonoscopy                            was performed without difficulty. The patient                            tolerated the procedure well. Scope In: 8:11:01 AM Scope Out: 8:27:51 AM Scope Withdrawal Time: 0 hours 13 minutes 5 seconds  Total Procedure Duration: 0 hours 16 minutes 50 seconds  Findings:                 The perianal and digital rectal examinations were                            normal.                           Four sessile polyps were found in the rectum (2),                            descending colon (1) and transverse colon (1). The                            polyps were 5 to 6 mm in size. These polyps were                            removed with a cold biopsy forceps. Resection and  retrieval were complete.                           The exam was otherwise without abnormality on                            direct and retroflexion views. Complications:            No immediate complications. Estimated blood loss:                            None. Estimated Blood Loss:     Estimated blood loss: none. Impression:               - Four 5 to 6 mm polyps in the rectum, in the                            descending colon and in the transverse colon,                            removed with a cold biopsy forceps. Resected and                            retrieved.                           - The examination was otherwise normal on direct                            and retroflexion views. Recommendation:           - Repeat colonoscopy in 5 years for surveillance.                           - Patient has a contact number available for                             emergencies. The signs and symptoms of potential                            delayed complications were discussed with the                            patient. Return to normal activities tomorrow.                            Written discharge instructions were provided to the                            patient.                           - Resume previous diet.                           - Continue present medications.                           -  Await pathology results. Ladene Artist, MD 06/06/2016 8:32:17 AM This report has been signed electronically.

## 2016-06-06 NOTE — Patient Instructions (Addendum)
YOU HAD AN ENDOSCOPIC PROCEDURE TODAY AT THE [facility]:   Refer to the procedure report that was given to you for any specific questions about what was found during the examination.  If the procedure report does not answer your questions, please call your gastroenterologist to clarify.  If you requested that your care partner not be given the details of your procedure findings, then the procedure report has been included in a sealed envelope for you to review at your convenience later.  YOU SHOULD EXPECT: Some feelings of bloating in the abdomen. Passage of more gas than usual.  Walking can help get rid of the air that was put into your GI tract during the procedure and reduce the bloating. If you had a lower endoscopy (such as a colonoscopy or flexible sigmoidoscopy) you may notice spotting of blood in your stool or on the toilet paper. If you underwent a bowel prep for your procedure, you may not have a normal bowel movement for a few days.  Please Note:  You might notice some irritation and congestion in your nose or some drainage.  This is from the oxygen used during your procedure.  There is no need for concern and it should clear up in a day or so.  SYMPTOMS TO REPORT IMMEDIATELY:   Following lower endoscopy (colonoscopy or flexible sigmoidoscopy):  Excessive amounts of blood in the stool  Significant tenderness or worsening of abdominal pains  Swelling of the abdomen that is new, acute  Fever of 100F or higher   For urgent or emergent issues, a gastroenterologist can be reached at any hour by calling [phone].   DIET:  We do recommend a small meal at first, but then you may proceed to your regular diet.  Drink plenty of fluids but you should avoid alcoholic beverages for 24 hours.  ACTIVITY:  You should plan to take it easy for the rest of today and you should NOT DRIVE or use heavy machinery until tomorrow (because of the sedation medicines used during the test).     FOLLOW UP: Our staff will call the number listed on your records the next business day following your procedure to check on you and address any questions or concerns that you may have regarding the information given to you following your procedure. If we do not reach you, we will leave a message.  However, if you are feeling well and you are not experiencing any problems, there is no need to return our call.  We will assume that you have returned to your regular daily activities without incident.  If any biopsies were taken you will be contacted by phone or by letter within the next 1-3 weeks.  Please call us at [phone] if you have not heard about the biopsies in 3 weeks.    SIGNATURES/CONFIDENTIALITY: You and/or your care partner have signed paperwork which will be entered into your electronic medical record.  These signatures attest to the fact that that the information above on your After Visit Summary has been reviewed and is understood.  Full responsibility of the confidentiality of this discharge information lies with you and/or your care-partner.  Read all of the handouts given to you by your recovery room nurse. Thank-you for choosing us for your healthcare needs today. 

## 2016-06-06 NOTE — Progress Notes (Signed)
Called to room to assist during endoscopic procedure.  Patient ID and intended procedure confirmed with present staff. Received instructions for my participation in the procedure from the performing physician.  

## 2016-06-06 NOTE — Progress Notes (Signed)
Report to PACU, RN, vss, BBS= Clear.  

## 2016-06-06 NOTE — Progress Notes (Signed)
No changes in medical hx since PV.  No use of oxygen at home or hx of sleep apnea

## 2016-06-07 ENCOUNTER — Telehealth: Payer: Self-pay

## 2016-06-07 NOTE — Telephone Encounter (Signed)
  Follow up Call-  Call back number 06/06/2016  Post procedure Call Back phone  # [phone]  Permission to leave phone message Yes  Some recent data might be hidden    Patient was called for follow up after her procedure on 06/06/2016. No answer at the number given for follow up phone call. A message was left on the answering machine.

## 2016-06-07 NOTE — Telephone Encounter (Signed)
  Follow up Call-  Call back number 06/06/2016  Post procedure Call Back phone  # [phone]  Permission to leave phone message Yes  Some recent data might be hidden    Patient was called for follow up after her procedure on 06/06/2016. No answer at the number given for follow up phone call. A message was left on the answering machine.

## 2016-06-15 ENCOUNTER — Encounter: Payer: Self-pay | Admitting: Gastroenterology

## 2016-07-31 ENCOUNTER — Emergency Department
Admission: EM | Admit: 2016-07-31 | Discharge: 2016-07-31 | Disposition: A | Discharge status: Home / Self Care | Payer: PPO | Attending: Emergency Medicine | Admitting: Emergency Medicine

## 2016-07-31 ENCOUNTER — Encounter: Payer: Self-pay | Admitting: Emergency Medicine

## 2016-07-31 ENCOUNTER — Emergency Department: Payer: PPO

## 2016-07-31 DIAGNOSIS — M25561 Pain in right knee: Secondary | ICD-10-CM

## 2016-07-31 DIAGNOSIS — M25461 Effusion, right knee: Secondary | ICD-10-CM | POA: Diagnosis not present

## 2016-07-31 DIAGNOSIS — Z7982 Long term (current) use of aspirin: Secondary | ICD-10-CM | POA: Diagnosis not present

## 2016-07-31 DIAGNOSIS — Z87891 Personal history of nicotine dependence: Secondary | ICD-10-CM | POA: Insufficient documentation

## 2016-07-31 DIAGNOSIS — Z79899 Other long term (current) drug therapy: Secondary | ICD-10-CM | POA: Diagnosis not present

## 2016-07-31 DIAGNOSIS — J45909 Unspecified asthma, uncomplicated: Secondary | ICD-10-CM | POA: Diagnosis not present

## 2016-07-31 DIAGNOSIS — N183 Chronic kidney disease, stage 3 (moderate): Secondary | ICD-10-CM | POA: Insufficient documentation

## 2016-07-31 MED ORDER — DICLOFENAC SODIUM 1 % TD GEL: 2.0000 g | Freq: Four times a day (QID) | TRANSDERMAL | 0 refills | Status: DC

## 2016-07-31 NOTE — ED Provider Notes (Signed)
[facility] Emergency Department Provider Note   ____________________________________________   I have reviewed the triage vital signs and the nursing notes.   HISTORY  Chief Complaint Leg Pain   History limited by: Not Limited   HPI Rebekah Johnston is a 66 y.o. female who presents to the emergency department today because of concerns for right knee pain. Patient states the pain started yesterday morning. It progressed throughout the day. It does feel somewhat better when she is up and walking. She is also no some swelling to the knee. She denies any recent trauma although states she fell about one month ago. Patient states that she has had similar issue once in the past for the knee became very swollen. This did resolve on its own. Patient denies any redness or warmth to the knee.    Past Medical History:  Diagnosis Date  . Asthma    no inhalers  . Bronchitis    taking meds now  . Chronic kidney disease 2008   Stage G3b/A1  . Depression   . GERD (gastroesophageal reflux disease)   . Glaucoma    no eye drops  . Hyperlipidemia    no meds  . Macular degeneration    Bil  . Osteoarthritis     Patient Active Problem List   Diagnosis Date Noted  . Osteopenia 05/16/2016  . Major depressive disorder, recurrent episode, moderate ([place]) 04/04/2016  . GERD (gastroesophageal reflux disease) 04/04/2016  . Low back pain 01/18/2016  . Right knee pain 01/18/2016  . Counseling regarding end of life decision making 03/18/2015  . Chronic kidney disease (CKD) stage G3b/A1, moderately decreased glomerular filtration rate (GFR) between 30-44 mL/min/1.73 square meter and albuminuria creatinine ratio less than 30 mg/g 08/20/2008  . HYPERCHOLESTEROLEMIA 05/20/2008  . ALLERGIC RHINITIS 04/16/2008  . EXERCISE INDUCED ASTHMA 04/16/2008  . OSTEOARTHRITIS 04/16/2008  . PREDIABETES 04/16/2008  . UMBILICAL HERNIORRHAPHY, HX OF 04/16/2008    Past Surgical History:   Procedure Laterality Date  . BUNIONECTOMY     rt. foot  . CHOLECYSTECTOMY    . HERNIA REPAIR     left inguinal hernia  . TONSILLECTOMY  1967    Prior to Admission medications   Medication Sig Start Date End Date Taking? Authorizing Provider  acetaminophen (TYLENOL) 325 MG tablet Take 650 mg by mouth every 6 (six) hours as needed.    Historical Provider, MD  aspirin 81 MG chewable tablet Chew 81 mg by mouth daily.    Historical Provider, MD  cetirizine (ZYRTEC) 10 MG tablet Take 10 mg by mouth as needed.     Historical Provider, MD  Cholecalciferol (VITAMIN D3) 2000 units TABS Take by mouth daily.    Historical Provider, MD  citalopram (CELEXA) 40 MG tablet Take 1 tablet (40 mg total) by mouth daily. 03/31/16   Amy Cletis Athens, MD  fluticasone (FLONASE) 50 MCG/ACT nasal spray Place 2 sprays into both nostrils as needed.     Historical Provider, MD  nystatin cream (MYCOSTATIN) Apply 1 application topically 2 (two) times daily. Patient taking differently: Apply 1 application topically as needed.  04/04/16   Amy Cletis Athens, MD  pantoprazole (PROTONIX) 40 MG tablet Take 40 mg by mouth daily. 02/16/15   Historical Provider, MD    Allergies Patient has no known allergies.  Family History  Problem Relation Age of Onset  . Prostate cancer Father   . Dementia Mother   . Diabetes Brother   . Melanoma  Uncle  . Colon cancer Neg Hx   . Esophageal cancer Neg Hx   . Rectal cancer Neg Hx   . Stomach cancer Neg Hx     Social History Social History  Substance Use Topics  . Smoking status: Former Smoker    Quit date: 05/23/2001  . Smokeless tobacco: Never Used     Comment: smoked socially not everyday thing.  . Alcohol use 0.6 oz/week    1 Glasses of wine per week     Comment: occasional once a month    Review of Systems  Constitutional: Negative for fever. Cardiovascular: Negative for chest pain. Respiratory: Negative for shortness of breath. Gastrointestinal: Negative for  abdominal pain, vomiting and diarrhea. Genitourinary: Negative for dysuria. Musculoskeletal: Positive for right knee pain. Skin: Negative for rash. Neurological: Negative for headaches, focal weakness or numbness.  10-point ROS otherwise negative.  ____________________________________________   PHYSICAL EXAM:  VITAL SIGNS: ED Triage Vitals  Enc Vitals Group     BP 07/31/16 0040 134/83     Pulse Rate 07/31/16 0039 65     Resp 07/31/16 0039 18     Temp 07/31/16 0039 97.7 F (36.5 C)     Temp Source 07/31/16 0039 Oral     SpO2 07/31/16 0039 99 %     Weight 07/31/16 0039 195 lb (88.5 kg)     Height 07/31/16 0039 5\' 4"  (1.626 m)     Head Circumference --      Peak Flow --      Pain Score 07/31/16 0038 5     Pain Loc --      Pain Edu? --      Excl. in [place]? --      Constitutional: Alert and oriented. Well appearing and in no distress. Eyes: Conjunctivae are normal. Normal extraocular movements. ENT   Head: Normocephalic and atraumatic.   Nose: No congestion/rhinnorhea.   Mouth/Throat: Mucous membranes are moist.   Neck: No stridor. Cardiovascular: Normal rate, regular rhythm.   Respiratory: Normal respiratory effort without tachypnea nor retractions. Breath sounds are clear and equal bilaterally. No wheezes/rales/rhonchi. Genitourinary: Deferred Musculoskeletal: Normal range of motion in all extremities. No lower extremity edema. Right knee with small amount of effusion. No warmth. No erythema. Painless passive ROM. No ecchymosis.  Neurologic:  Normal speech and language. No gross focal neurologic deficits are appreciated.  Skin:  Skin is warm, dry and intact. No rash noted. Psychiatric: Mood and affect are normal. Speech and behavior are normal. Patient exhibits appropriate insight and judgment.  ____________________________________________    LABS (pertinent  positives/negatives)  None  ____________________________________________   EKG  None  ____________________________________________    RADIOLOGY  Right knee  IMPRESSION: Small suprapatellar joint effusion with mild induration of the infrapatellar fat pad possibly from the joint effusion. Minimal degenerative spurring is noted anteriorly and adjacent to the tibial spine. No acute appearing fracture, intra-articular loose body nor bone destruction is noted.  ____________________________________________   PROCEDURES  Procedures  ____________________________________________   INITIAL IMPRESSION / ASSESSMENT AND PLAN / ED COURSE  Pertinent labs & imaging results that were available during my care of the patient were reviewed by me and considered in my medical decision making (see chart for details).  Patient presented to the emergency department today with concerns for right knee pain and swelling. X-ray was performed which did not show any acute osseous njury. The patient had similar symptoms in the past. At this time given lack of warmth or redness do not  feel emergent arthrocentesis necessary. Will give patient topical NSAIDs, will give orthopedic follow up.  ____________________________________________   FINAL CLINICAL IMPRESSION(S) / ED DIAGNOSES  Final diagnoses:  Acute pain of right knee  Effusion of right knee     Note: This dictation was prepared with Dragon dictation. Any transcriptional errors that result from this process are unintentional     Nance Pear, MD 07/31/16 [phone]

## 2016-07-31 NOTE — Discharge Instructions (Signed)
Please seek medical attention for any high fevers, chest pain, shortness of breath, change in behavior, persistent vomiting, bloody stool or any other new or concerning symptoms.  

## 2016-07-31 NOTE — ED Triage Notes (Signed)
Patient states that she fell on her right knee about a month ago. Patient reports that she continues to have intermittent pain and swelling to the knee since the fall. Patient states that tonight the pain became worse.

## 2016-08-28 ENCOUNTER — Ambulatory Visit (INDEPENDENT_AMBULATORY_CARE_PROVIDER_SITE_OTHER): Payer: PPO | Admitting: Family Medicine

## 2016-08-28 ENCOUNTER — Encounter: Payer: Self-pay | Admitting: Family Medicine

## 2016-08-28 VITALS — BP 120/88 | HR 58 | Temp 97.8°F | Wt 199.8 lb

## 2016-08-28 DIAGNOSIS — N3090 Cystitis, unspecified without hematuria: Secondary | ICD-10-CM

## 2016-08-28 DIAGNOSIS — S93601A Unspecified sprain of right foot, initial encounter: Secondary | ICD-10-CM | POA: Diagnosis not present

## 2016-08-28 DIAGNOSIS — R300 Dysuria: Secondary | ICD-10-CM | POA: Diagnosis not present

## 2016-08-28 LAB — POCT URINALYSIS DIPSTICK
Bilirubin, UA: NEGATIVE
Glucose, UA: NEGATIVE
KETONES UA: NEGATIVE
NITRITE UA: NEGATIVE
PH UA: 6 (ref 5.0–8.0)
PROTEIN UA: NEGATIVE
Spec Grav, UA: 1.025 (ref 1.010–1.025)
Urobilinogen, UA: 0.2 E.U./dL

## 2016-08-28 MED ORDER — CEPHALEXIN 500 MG PO CAPS: 500.0000 mg | ORAL_CAPSULE | Freq: Three times a day (TID) | ORAL | 0 refills | Status: DC

## 2016-08-28 NOTE — Progress Notes (Signed)
Subjective:    Patient ID: Rebekah Johnston, female    DOB: 1951/01/23, 66 y.o.   MRN: [id number]  HPI This is a 66 yo female who presents today with dysuria x 1 day, nocturia x 3 last night. Felt like she couldn't completely empty. No fever, no flank pain. No nausea or vomiting. No recent travel or diarrheal illness. Has not had UTI in many years.   Twisted right foot a couple of weeks ago, fell down last two stairs while carrying her grandson. Some swelling across top which has mostly gone down, but still swells at end of day. No bruising. Able to wear any of her shoes.   Past Medical History:  Diagnosis Date  . Asthma    no inhalers  . Bronchitis    taking meds now  . Chronic kidney disease 2008   Stage G3b/A1  . Depression   . GERD (gastroesophageal reflux disease)   . Glaucoma    no eye drops  . Hyperlipidemia    no meds  . Macular degeneration    Bil  . Osteoarthritis    Past Surgical History:  Procedure Laterality Date  . BUNIONECTOMY     rt. foot  . CHOLECYSTECTOMY    . HERNIA REPAIR     left inguinal hernia  . TONSILLECTOMY  1967   Family History  Problem Relation Age of Onset  . Prostate cancer Father   . Dementia Mother   . Diabetes Brother   . Melanoma      Uncle  . Colon cancer Neg Hx   . Esophageal cancer Neg Hx   . Rectal cancer Neg Hx   . Stomach cancer Neg Hx    Social History  Substance Use Topics  . Smoking status: Former Smoker    Quit date: 05/23/2001  . Smokeless tobacco: Never Used     Comment: smoked socially not everyday thing.  . Alcohol use 0.6 oz/week    1 Glasses of wine per week     Comment: occasional once a month      Review of Systems Per hpi    Objective:   Physical Exam  Constitutional: She is oriented to person, place, and time. She appears well-developed and well-nourished. No distress.  Cardiovascular: Normal rate, regular rhythm and normal heart sounds.   Pulmonary/Chest: Effort normal and breath sounds  normal.  Abdominal: Soft. She exhibits no distension. There is no tenderness. There is no rebound, no guarding and no CVA tenderness.  Musculoskeletal:       Right ankle: She exhibits swelling (very slight across lateral foot. ). She exhibits normal range of motion and no ecchymosis. No tenderness. Achilles tendon exhibits no pain.  Able to stand alone on affected foot.   Neurological: She is alert and oriented to person, place, and time.  Skin: Skin is warm and dry. She is not diaphoretic.  Psychiatric: She has a normal mood and affect. Her behavior is normal. Judgment and thought content normal.  Vitals reviewed.        BP 120/88 (BP Location: Left Arm, Patient Position: Sitting, Cuff Size: Normal)   Pulse (!) 58   Temp 97.8 F (36.6 C) (Oral)   Wt 199 lb 12 oz (90.6 kg)   SpO2 97%   BMI 34.29 kg/m  Wt Readings from Last 3 Encounters:  08/28/16 199 lb 12 oz (90.6 kg)  07/31/16 195 lb (88.5 kg)  06/06/16 198 lb (89.8 kg)   Results for orders placed or  performed in visit on 08/28/16  POCT urinalysis dipstick  Result Value Ref Range   Color, UA YELLOW    Clarity, UA CLOUDY    Glucose, UA NEG    Bilirubin, UA NEG    Ketones, UA NEG    Spec Grav, UA 1.025 1.010 - 1.025   Blood, UA 1+    pH, UA 6.0 5.0 - 8.0   Protein, UA NEG    Urobilinogen, UA 0.2 0.2 or 1.0 E.U./dL   Nitrite, UA NEG    Leukocytes, UA Moderate (2+) (A) Negative    Assessment & Plan:  1. Dysuria - POCT urinalysis dipstick  2. Cystitis - Provided written and verbal information regarding diagnosis and treatment. - RTC precautions reviewed - cephALEXin (KEFLEX) 500 MG capsule; Take 1 capsule (500 mg total) by mouth 3 (three) times daily.  Dispense: 21 capsule; Refill: 0 - Urine culture  3. Sprain of right foot, initial encounter - encouraged her to elevate as much as possible, wear supportive shoes and discussed exercises for foot strengthening.    Clarene Reamer, FNP-BC  [facility]  [facility]  08/28/2016 3:29 PM

## 2016-08-28 NOTE — Progress Notes (Signed)
Pre visit review using our clinic review tool, if applicable. No additional management support is needed unless otherwise documented below in the visit note. 

## 2016-08-28 NOTE — Patient Instructions (Addendum)
Can use AZO if needed for pain with urinating  Increase fluids  Supportive shoes, write the alphabet with your foot twice a day   Urinary Tract Infection, Adult A urinary tract infection (UTI) is an infection of any part of the urinary tract, which includes the kidneys, ureters, bladder, and urethra. These organs make, store, and get rid of urine in the body. UTI can be a bladder infection (cystitis) or kidney infection (pyelonephritis). What are the causes? This infection may be caused by fungi, viruses, or bacteria. Bacteria are the most common cause of UTIs. This condition can also be caused by repeated incomplete emptying of the bladder during urination. What increases the risk? This condition is more likely to develop if:  You ignore your need to urinate or hold urine for long periods of time.  You do not empty your bladder completely during urination.  You wipe back to front after urinating or having a bowel movement, if you are female.  You are uncircumcised, if you are female.  You are constipated.  You have a urinary catheter that stays in place (indwelling).  You have a weak defense (immune) system.  You have a medical condition that affects your bowels, kidneys, or bladder.  You have diabetes.  You take antibiotic medicines frequently or for long periods of time, and the antibiotics no longer work well against certain types of infections (antibiotic resistance).  You take medicines that irritate your urinary tract.  You are exposed to chemicals that irritate your urinary tract.  You are female. What are the signs or symptoms? Symptoms of this condition include:  Fever.  Frequent urination or passing small amounts of urine frequently.  Needing to urinate urgently.  Pain or burning with urination.  Urine that smells bad or unusual.  Cloudy urine.  Pain in the lower abdomen or back.  Trouble urinating.  Blood in the urine.  Vomiting or being less  hungry than normal.  Diarrhea or abdominal pain.  Vaginal discharge, if you are female. How is this diagnosed? This condition is diagnosed with a medical history and physical exam. You will also need to provide a urine sample to test your urine. Other tests may be done, including:  Blood tests.  Sexually transmitted disease (STD) testing. If you have had more than one UTI, a cystoscopy or imaging studies may be done to determine the cause of the infections. How is this treated? Treatment for this condition often includes a combination of two or more of the following:  Antibiotic medicine.  Other medicines to treat less common causes of UTI.  Over-the-counter medicines to treat pain.  Drinking enough water to stay hydrated. Follow these instructions at home:  Take over-the-counter and prescription medicines only as told by your health care provider.  If you were prescribed an antibiotic, take it as told by your health care provider. Do not stop taking the antibiotic even if you start to feel better.  Avoid alcohol, caffeine, tea, and carbonated beverages. They can irritate your bladder.  Drink enough fluid to keep your urine clear or pale yellow.  Keep all follow-up visits as told by your health care provider. This is important.  Make sure to:  Empty your bladder often and completely. Do not hold urine for long periods of time.  Empty your bladder before and after sex.  Wipe from front to back after a bowel movement if you are female. Use each tissue one time when you wipe. Contact a health care provider  if:  You have back pain.  You have a fever.  You feel nauseous or vomit.  Your symptoms do not get better after 3 days.  Your symptoms go away and then return. Get help right away if:  You have severe back pain or lower abdominal pain.  You are vomiting and cannot keep down any medicines or water. This information is not intended to replace advice given to you  by your health care provider. Make sure you discuss any questions you have with your health care provider. Document Released: 01/25/2005 Document Revised: 09/29/2015 Document Reviewed: 03/08/2015 Elsevier Interactive Patient Education  2017 [employer].

## 2016-08-30 LAB — URINE CULTURE

## 2016-09-11 ENCOUNTER — Encounter: Payer: Self-pay | Admitting: *Deleted

## 2016-09-11 ENCOUNTER — Ambulatory Visit
Admission: EM | Admit: 2016-09-11 | Discharge: 2016-09-11 | Disposition: A | Discharge status: Home / Self Care | Payer: PPO | Attending: Family Medicine | Admitting: Family Medicine

## 2016-09-11 DIAGNOSIS — J301 Allergic rhinitis due to pollen: Secondary | ICD-10-CM | POA: Diagnosis not present

## 2016-09-11 DIAGNOSIS — R05 Cough: Secondary | ICD-10-CM | POA: Diagnosis not present

## 2016-09-11 MED ORDER — GUAIFENESIN-CODEINE 100-10 MG/5ML PO SYRP: 5.0000 mL | ORAL_SOLUTION | Freq: Three times a day (TID) | ORAL | 0 refills | Status: DC | PRN

## 2016-09-11 MED ORDER — BENZONATATE 200 MG PO CAPS: ORAL_CAPSULE | ORAL | 0 refills | Status: DC

## 2016-09-11 NOTE — ED Provider Notes (Signed)
CSN: [id number]     Arrival date & time 09/11/16  0102 History   First MD Initiated Contact with Patient 09/11/16 1013     Chief Complaint  Patient presents with  . Cough  . Nasal Congestion   (Consider location/radiation/quality/duration/timing/severity/associated sxs/prior Treatment) HPI  This is a 66 year old female who presents with a three-day history nasal congestion nasal drainage and bilateral ear pain. Patient states she does have a history of seasonal allergies. She is coughing a great deal of nighttime but throughout the day as well. She states she is using Flonase on a routine basis along with Zyrtec. Complaining of jaw pain and sinus pressure and pain.       Past Medical History:  Diagnosis Date  . Asthma    no inhalers  . Bronchitis    taking meds now  . Chronic kidney disease 2008   Stage G3b/A1  . Depression   . GERD (gastroesophageal reflux disease)   . Glaucoma    no eye drops  . Hyperlipidemia    no meds  . Macular degeneration    Bil  . Osteoarthritis    Past Surgical History:  Procedure Laterality Date  . BUNIONECTOMY     rt. foot  . CHOLECYSTECTOMY    . HERNIA REPAIR     left inguinal hernia  . TONSILLECTOMY  1967   Family History  Problem Relation Age of Onset  . Prostate cancer Father   . Dementia Mother   . Diabetes Brother   . Melanoma Unknown        Uncle  . Colon cancer Neg Hx   . Esophageal cancer Neg Hx   . Rectal cancer Neg Hx   . Stomach cancer Neg Hx    Social History  Substance Use Topics  . Smoking status: Former Smoker    Quit date: 05/23/2001  . Smokeless tobacco: Never Used     Comment: smoked socially not everyday thing.  . Alcohol use 0.6 oz/week    1 Glasses of wine per week     Comment: occasional once a month   OB History    No data available     Review of Systems  Constitutional: Positive for activity change. Negative for appetite change, chills, fatigue and fever.  HENT: Positive for congestion, ear  pain, postnasal drip, rhinorrhea, sinus pain, sinus pressure and sneezing.   Respiratory: Positive for cough. Negative for shortness of breath, wheezing and stridor.   All other systems reviewed and are negative.   Allergies  Patient has no known allergies.  Home Medications   Prior to Admission medications   Medication Sig Start Date End Date Taking? Authorizing Provider  acetaminophen (TYLENOL) 325 MG tablet Take 650 mg by mouth every 6 (six) hours as needed.   Yes [provider]  aspirin 81 MG chewable tablet Chew 81 mg by mouth daily.   Yes [provider]  cetirizine (ZYRTEC) 10 MG tablet Take 10 mg by mouth as needed.    Yes [provider]  Cholecalciferol (VITAMIN D3) 2000 units TABS Take by mouth daily.   Yes [provider]  citalopram (CELEXA) 40 MG tablet Take 1 tablet (40 mg total) by mouth daily. 03/31/16  Yes Bedsole, Amy E, MD  diclofenac sodium (VOLTAREN) 1 % GEL Apply 2 g topically 4 (four) times daily. 07/31/16  Yes Nance Pear, MD  fluticasone [facility]) 50 MCG/ACT nasal spray Place 2 sprays into both nostrils as needed.    Yes [provider]  nystatin cream (MYCOSTATIN) Apply 1 application topically 2 (two) times daily. Patient taking differently: Apply 1 application topically as needed.  04/04/16  Yes Bedsole, Amy E, MD  pantoprazole (PROTONIX) 40 MG tablet Take 40 mg by mouth daily. 02/16/15  Yes [provider]  benzonatate (TESSALON) 200 MG capsule Take one cap TID PRN cough 09/11/16   Lorin Picket, PA-C  cephALEXin (KEFLEX) 500 MG capsule Take 1 capsule (500 mg total) by mouth 3 (three) times daily. 08/28/16   Elby Beck, FNP  guaiFENesin-codeine (CHERATUSSIN AC) 100-10 MG/5ML syrup Take 5 mLs by mouth 3 (three) times daily as needed for cough. 09/11/16   Lorin Picket, PA-C   Meds Ordered and Administered this Visit  Medications - No data to display  BP 122/74 (BP Location: Left Arm)   Pulse  65   Temp 98.2 F (36.8 C) (Oral)   Resp 16   Ht 5\' 4"  (1.626 m)   Wt 200 lb (90.7 kg)   SpO2 98%   BMI 34.33 kg/m  No data found.   Physical Exam  Constitutional: She is oriented to person, place, and time. She appears well-developed and well-nourished. No distress.  HENT:  Head: Normocephalic and atraumatic.  Right Ear: External ear normal.  Left Ear: External ear normal.  Mouth/Throat: Oropharynx is clear and moist. No oropharyngeal exudate.  Nasal turbinates are mildly swollen and erythematous. There is mucus present. Patient does not have any tenderness over the sinuses on percussion.  Eyes: Conjunctivae are normal. Pupils are equal, round, and reactive to light. Right eye exhibits no discharge. Left eye exhibits no discharge.  Neck: Normal range of motion. Neck supple.  Pulmonary/Chest: Effort normal and breath sounds normal. No respiratory distress. She has no wheezes. She has no rales.  Musculoskeletal: Normal range of motion.  Lymphadenopathy:    She has no cervical adenopathy.  Neurological: She is alert and oriented to person, place, and time.  Skin: Skin is warm and dry. She is not diaphoretic.  Psychiatric: She has a normal mood and affect. Her behavior is normal. Judgment and thought content normal.  Nursing note and vitals reviewed.   Urgent Care Course     Procedures (including critical care time)  Labs Review Labs Reviewed - No data to display  Imaging Review No results found.   Visual Acuity Review  Right Eye Distance:   Left Eye Distance:   Bilateral Distance:    Right Eye Near:   Left Eye Near:    Bilateral Near:         MDM   1. Seasonal allergic rhinitis due to pollen    Discharge Medication List as of 09/11/2016 10:35 AM    START taking these medications   Details  benzonatate (TESSALON) 200 MG capsule Take one cap TID PRN cough, Normal    guaiFENesin-codeine (CHERATUSSIN AC) 100-10 MG/5ML syrup Take 5 mLs by mouth 3 (three)  times daily as needed for cough., Starting Mon 09/11/2016, Print      Plan: 1. Test/x-ray results and diagnosis reviewed with patient 2. rx as per orders; risks, benefits, potential side effects reviewed with patient 3. Recommend supportive treatment with Rest and fluids. Recommended using a Nettie pot daily followed by [facility]. She may use Zyrtec-D for 1 week then referred back to her regular Zyrtec. Because she is coughing a great deal of nighttime and throughout the day I will provide her with cough medicine for relief. If she is not improving she  should follow-up with her primary care physician 4. F/u prn if symptoms worsen or don't improve     Lorin Picket, PA-C 09/11/16 1045

## 2016-09-11 NOTE — ED Triage Notes (Signed)
Patient started having symptoms of nasal congestion, nasal drainage, cough, and bilateral ear pain. Patient does have a history of seasonal allergies.

## 2016-09-12 ENCOUNTER — Telehealth: Payer: Self-pay | Admitting: Family Medicine

## 2016-09-12 NOTE — Telephone Encounter (Signed)
Pt has appt with Dr Damita Dunnings on 09/13/16 at 3:30.

## 2016-09-12 NOTE — Telephone Encounter (Signed)
Patient Name: CORNELIOUS DIVEN  DOB: [birth year]    Initial Comment Caller Merie says she went to urgent care last night because her face was in pain sinus teeth and everywhere in her face. She was given cough syrup and pills for it but today she was coughing up blood she says its in the mucus   Nurse Assessment  Nurse: Mallie Mussel, RN, Alveta Heimlich Date/Time [name] Time): 09/12/2016 11:30:21 AM  Confirm and document reason for call. If symptomatic, describe symptoms. ---Caller states that she has a cough which began Friday night. Yesterday evening, after being seen at an UC, she began coughing sputum with some blood in it. She has seen blood x 4. She denies difficulty breathing. She denies fever. She denies feeling weak.  Does the patient have any new or worsening symptoms? ---Yes  Will a triage be completed? ---Yes  Related visit to physician within the last 2 weeks? ---No  Does the PT have any chronic conditions? (i.e. diabetes, asthma, etc.) ---Yes  List chronic conditions. ---Asthma  Is this a behavioral health or substance abuse call? ---No     Guidelines    Guideline Title Affirmed Question Affirmed Notes  Coughing Up Blood Coughing up rusty-colored sputum    Final Disposition User   See Physician within [place], RN, Alveta Heimlich    Comments  She did cough up a brownish sputum x 1 this morning.  I was able to schedule an appointment for her tomorrow with Dr. Eliezer Lofts at 3:30pm.   Referrals  REFERRED TO PCP OFFICE   Disagree/Comply: Comply

## 2016-09-13 ENCOUNTER — Encounter: Payer: Self-pay | Admitting: Family Medicine

## 2016-09-13 ENCOUNTER — Ambulatory Visit (INDEPENDENT_AMBULATORY_CARE_PROVIDER_SITE_OTHER): Payer: PPO | Admitting: Family Medicine

## 2016-09-13 DIAGNOSIS — J069 Acute upper respiratory infection, unspecified: Secondary | ICD-10-CM | POA: Diagnosis not present

## 2016-09-13 NOTE — Progress Notes (Signed)
Cough. Started about 5 days ago.  Scratchy throat, then more and more cough, then diffuse aches.  Not sleeping well in the last days.  Facial pain.  B upper tooth pain, ear pain, facial pain.  The URI sx are much better, mild episodic HA now.  Neck aching recently, intermittently- not sore to the touch.  Some sputum with cough.  Started 2 nights ago.  She has some bloody vs brown sputum.  Today she had blood streaked sputum, not overt hemoptysis.  She feels a little better each day, overall.    She is finishing abx for cystitis.  Her ankle is some better in the meantime, not worse.    Meds, vitals, and allergies reviewed.   ROS: Per HPI unless specifically indicated in ROS section   GEN: nad, alert and oriented, nontoxic.   HEENT: mucous membranes moist, tm w/o erythema, nasal exam w/o erythema, clear discharge noted,  OP with cobblestoning, no bleeding site seen in the nasal or OP exam, sinuses not ttp x4 NECK: supple w/o LA CV: rrr.   PULM: ctab, no inc wob EXT: no edema

## 2016-09-13 NOTE — Assessment & Plan Note (Addendum)
Likely viral, likely with scant hemoptysis from irritation/coughing.   She should do well, should gradually get better.  Rest and fluids, tessalon for cough.  Update [place] as needed, esp if sx continue/worsen.  I don't suspect ominous dx as she already feels better.

## 2016-09-13 NOTE — Patient Instructions (Signed)
I think this was from a cold and irritation/coughing.   You should do well, you should gradually get better.  Rest and fluids, tessalon for cough.  Take care.  Glad to see you.

## 2016-11-10 ENCOUNTER — Telehealth: Payer: Self-pay | Admitting: Family Medicine

## 2016-11-10 DIAGNOSIS — M6530 Trigger finger, unspecified finger: Secondary | ICD-10-CM

## 2016-11-10 NOTE — Telephone Encounter (Signed)
Referral for hand specilist ordered.

## 2016-11-10 NOTE — Telephone Encounter (Signed)
Pt called reagarding her trigger finger pain. She said she discussed at last visit and she is ready to get treatment. Does she need a referral? She is requesting a cb to discuss.

## 2016-11-10 NOTE — Telephone Encounter (Signed)
Ms. Rebekah Johnston notified that Dr. Diona Browner has put in referral to hand specialist.  Will await Rebekah Johnston's call.

## 2016-11-21 DIAGNOSIS — M65342 Trigger finger, left ring finger: Secondary | ICD-10-CM | POA: Diagnosis not present

## 2016-11-21 DIAGNOSIS — M65341 Trigger finger, right ring finger: Secondary | ICD-10-CM | POA: Diagnosis not present

## 2016-11-22 DIAGNOSIS — L821 Other seborrheic keratosis: Secondary | ICD-10-CM | POA: Diagnosis not present

## 2016-11-22 DIAGNOSIS — Z872 Personal history of diseases of the skin and subcutaneous tissue: Secondary | ICD-10-CM | POA: Diagnosis not present

## 2017-01-22 DIAGNOSIS — I340 Nonrheumatic mitral (valve) insufficiency: Secondary | ICD-10-CM | POA: Diagnosis not present

## 2017-01-22 DIAGNOSIS — E782 Mixed hyperlipidemia: Secondary | ICD-10-CM | POA: Diagnosis not present

## 2017-01-22 DIAGNOSIS — R0602 Shortness of breath: Secondary | ICD-10-CM | POA: Diagnosis not present

## 2017-02-15 ENCOUNTER — Ambulatory Visit
Admission: EM | Admit: 2017-02-15 | Discharge: 2017-02-15 | Disposition: A | Discharge status: Home / Self Care | Payer: PPO | Attending: Family Medicine | Admitting: Family Medicine

## 2017-02-15 ENCOUNTER — Encounter: Payer: Self-pay | Admitting: Emergency Medicine

## 2017-02-15 DIAGNOSIS — M25552 Pain in left hip: Secondary | ICD-10-CM | POA: Diagnosis not present

## 2017-02-15 DIAGNOSIS — T148XXA Other injury of unspecified body region, initial encounter: Secondary | ICD-10-CM | POA: Diagnosis not present

## 2017-02-15 DIAGNOSIS — Z23 Encounter for immunization: Secondary | ICD-10-CM

## 2017-02-15 DIAGNOSIS — W19XXXA Unspecified fall, initial encounter: Secondary | ICD-10-CM

## 2017-02-15 DIAGNOSIS — S50312A Abrasion of left elbow, initial encounter: Secondary | ICD-10-CM

## 2017-02-15 MED ORDER — TRAMADOL HCL 50 MG PO TABS: 50.0000 mg | ORAL_TABLET | Freq: Three times a day (TID) | ORAL | 0 refills | Status: DC | PRN

## 2017-02-15 MED ORDER — TETANUS-DIPHTH-ACELL PERTUSSIS 5-2.5-18.5 LF-MCG/0.5 IM SUSP
0.5000 mL | Freq: Once | INTRAMUSCULAR | Status: AC
  Administered 2017-02-15: 0.5 mL via INTRAMUSCULAR

## 2017-02-15 NOTE — ED Triage Notes (Signed)
Patient in today for evaluation of a laceration she sustained after falling this morning. Patient is c/o left elbow laceration and left hip pain with slight numbness in left leg. Patient unsure of last tetanus shot.

## 2017-02-15 NOTE — Discharge Instructions (Addendum)
Use the tramadol as needed.  Take care  Dr. Lacinda Axon

## 2017-02-15 NOTE — ED Provider Notes (Addendum)
[facility]    CSN: [id number] Arrival date & time: 02/15/17  1015  History   Chief Complaint Chief Complaint  Patient presents with  . Extremity Laceration    HPI  66 year old female with OA, chronic kidney disease presents following a fall.  Patient was taking her dog to a bit earlier this morning. She was holding the dog by the collar and lost her footing and fell forward. She suffered an abrasion of her left elbow. There is a small open area. Additionally, she's had left hip and left lower back pain. Moderate in severity. She reports some associated numbness of her left lower extremity. No medications or interventions tried. She came in directly for evaluation. No other complaints or concerns at this time.  Past Medical History:  Diagnosis Date  . Asthma    no inhalers  . Bronchitis    taking meds now  . Chronic kidney disease 2008   Stage G3b/A1  . Depression   . GERD (gastroesophageal reflux disease)   . Glaucoma    no eye drops  . Hyperlipidemia    no meds  . Macular degeneration    Bil  . Osteoarthritis     Patient Active Problem List   Diagnosis Date Noted  . Osteopenia 05/16/2016  . Major depressive disorder, recurrent episode, moderate ([place]) 04/04/2016  . GERD (gastroesophageal reflux disease) 04/04/2016  . Low back pain 01/18/2016  . Right knee pain 01/18/2016  . Counseling regarding end of life decision making 03/18/2015  . URI (upper respiratory infection) 11/29/2010  . Chronic kidney disease (CKD) stage G3b/A1, moderately decreased glomerular filtration rate (GFR) between 30-44 mL/min/1.73 square meter and albuminuria creatinine ratio less than 30 mg/g (HCC) 08/20/2008  . HYPERCHOLESTEROLEMIA 05/20/2008  . ALLERGIC RHINITIS 04/16/2008  . EXERCISE INDUCED ASTHMA 04/16/2008  . OSTEOARTHRITIS 04/16/2008  . PREDIABETES 04/16/2008  . UMBILICAL HERNIORRHAPHY, HX OF 04/16/2008    Past Surgical History:  Procedure Laterality Date  .  BUNIONECTOMY     rt. foot  . CHOLECYSTECTOMY    . HERNIA REPAIR     left inguinal hernia  . TONSILLECTOMY  1967    OB History    No data available     Home Medications    Prior to Admission medications   Medication Sig Start Date End Date Taking? Authorizing Provider  acetaminophen (TYLENOL) 325 MG tablet Take 650 mg by mouth every 6 (six) hours as needed.   Yes [provider]  cetirizine (ZYRTEC) 10 MG tablet Take 10 mg by mouth as needed.    Yes [provider]  citalopram (CELEXA) 40 MG tablet Take 1 tablet (40 mg total) by mouth daily. 03/31/16  Yes Bedsole, Amy E, MD  pantoprazole (PROTONIX) 40 MG tablet Take 40 mg by mouth daily. 02/16/15  Yes [provider]  aspirin 81 MG chewable tablet Chew 81 mg by mouth daily.    [provider]  Cholecalciferol (VITAMIN D3) 2000 units TABS Take by mouth daily.    [provider]  diclofenac sodium (VOLTAREN) 1 % GEL Apply 2 g topically 4 (four) times daily. 07/31/16   Nance Pear, MD  fluticasone [facility]) 50 MCG/ACT nasal spray Place 2 sprays into both nostrils as needed.     [provider]  traMADol (ULTRAM) 50 MG tablet Take 1 tablet (50 mg total) by mouth every 8 (eight) hours as needed. 02/15/17   Coral Spikes, DO   Family History Family History  Problem Relation Age of  Onset  . Prostate cancer Father   . Dementia Mother   . Diabetes Brother   . Melanoma Unknown        Uncle  . Colon cancer Neg Hx   . Esophageal cancer Neg Hx   . Rectal cancer Neg Hx   . Stomach cancer Neg Hx     Social History Social History  Substance Use Topics  . Smoking status: Former Smoker    Quit date: 05/23/2001  . Smokeless tobacco: Never Used     Comment: smoked socially not everyday thing.  . Alcohol use 0.6 oz/week    1 Glasses of wine per week     Comment: occasional once a month     Allergies   Patient has no known allergies.   Review of Systems Review of Systems    Respiratory: Negative.   Cardiovascular: Negative.   Musculoskeletal:       Hip/back pain.  Skin: Positive for wound.   Physical Exam Triage Vital Signs ED Triage Vitals  Enc Vitals Group     BP 02/15/17 1037 103/68     Pulse Rate 02/15/17 1037 62     Resp 02/15/17 1037 16     Temp 02/15/17 1037 98.2 F (36.8 C)     Temp Source 02/15/17 1037 Oral     SpO2 02/15/17 1037 98 %     Weight 02/15/17 1034 200 lb (90.7 kg)     Height 02/15/17 1034 5' 3.5" (1.613 m)     Head Circumference --      Peak Flow --      Pain Score 02/15/17 1035 8     Pain Loc --      Pain Edu? --      Excl. in [place]? --    Updated Vital Signs BP 103/68 (BP Location: Right Arm)   Pulse 62   Temp 98.2 F (36.8 C) (Oral)   Resp 16   Ht 5' 3.5" (1.613 m)   Wt 200 lb (90.7 kg)   SpO2 98%   BMI 34.87 kg/m   Physical Exam  Constitutional: She is oriented to person, place, and time. She appears well-developed. No distress.  HENT:  Head: Normocephalic and atraumatic.  Cardiovascular: Normal rate and regular rhythm.   Pulmonary/Chest: Effort normal and breath sounds normal. No respiratory distress. She has no wheezes. She has no rales.  Musculoskeletal:  Lumbar spine - paraspinal muscle tenderness to palpation (left).   Left hip - + Greater trochanter tenderness to palpation.  Neurological: She is alert and oriented to person, place, and time.  Skin:  Forearm, just distal to the elbow with a 2.5 cm abrasion. There is a central, small punctate open wound that is partial-thickness.  Psychiatric: She has a normal mood and affect.  Vitals reviewed.  UC Treatments / Results  Labs (all labs ordered are listed, but only abnormal results are displayed) Labs Reviewed - No data to display  EKG  EKG Interpretation None       Radiology No results found.  Procedures Procedures (including critical care time)  Medications Ordered in UC Medications  Tdap (BOOSTRIX) injection 0.5 mL (0.5 mLs  Intramuscular Given 02/15/17 1048)     Initial Impression / Assessment and Plan / UC Course  I have reviewed the triage vital signs and the nursing notes.  Pertinent labs & imaging results that were available during my care of the patient were reviewed by me and considered in my medical decision making (see chart  for details).    66 year old female presents following a fall. She has muscular skeletal pain. Her wound does not need to be sutured. Tdap given. Was Steri-Stripped. Tramadol for pain (no NSAID's in setting of CKD).  Final Clinical Impressions(s) / UC Diagnoses   Final diagnoses:  Fall, initial encounter  Left hip pain  Abrasion   New Prescriptions New Prescriptions   TRAMADOL (ULTRAM) 50 MG TABLET    Take 1 tablet (50 mg total) by mouth every 8 (eight) hours as needed.   Controlled Substance Prescriptions [place] Controlled Substance Registry consulted? Yes, I have consulted the [place] Controlled Substances Registry for this patient, and feel the risk/benefit ratio today is favorable for proceeding with this prescription for a controlled substance.   Coral Spikes, DO 02/15/17 [place], [place], [place] 02/15/17 1121

## 2017-02-16 ENCOUNTER — Encounter: Payer: Self-pay | Admitting: Emergency Medicine

## 2017-02-16 ENCOUNTER — Emergency Department: Payer: PPO

## 2017-02-16 ENCOUNTER — Emergency Department
Admission: EM | Admit: 2017-02-16 | Discharge: 2017-02-16 | Disposition: A | Discharge status: Home / Self Care | Payer: PPO | Attending: Emergency Medicine | Admitting: Emergency Medicine

## 2017-02-16 DIAGNOSIS — S59902A Unspecified injury of left elbow, initial encounter: Secondary | ICD-10-CM | POA: Diagnosis not present

## 2017-02-16 DIAGNOSIS — Z7982 Long term (current) use of aspirin: Secondary | ICD-10-CM | POA: Diagnosis not present

## 2017-02-16 DIAGNOSIS — J45909 Unspecified asthma, uncomplicated: Secondary | ICD-10-CM | POA: Insufficient documentation

## 2017-02-16 DIAGNOSIS — W19XXXD Unspecified fall, subsequent encounter: Secondary | ICD-10-CM | POA: Diagnosis not present

## 2017-02-16 DIAGNOSIS — Y929 Unspecified place or not applicable: Secondary | ICD-10-CM | POA: Diagnosis not present

## 2017-02-16 DIAGNOSIS — S7012XA Contusion of left thigh, initial encounter: Secondary | ICD-10-CM

## 2017-02-16 DIAGNOSIS — Z87891 Personal history of nicotine dependence: Secondary | ICD-10-CM | POA: Diagnosis not present

## 2017-02-16 DIAGNOSIS — M25552 Pain in left hip: Secondary | ICD-10-CM | POA: Diagnosis not present

## 2017-02-16 DIAGNOSIS — S7002XA Contusion of left hip, initial encounter: Secondary | ICD-10-CM | POA: Diagnosis not present

## 2017-02-16 DIAGNOSIS — S59901A Unspecified injury of right elbow, initial encounter: Secondary | ICD-10-CM | POA: Diagnosis not present

## 2017-02-16 DIAGNOSIS — Z79899 Other long term (current) drug therapy: Secondary | ICD-10-CM | POA: Diagnosis not present

## 2017-02-16 DIAGNOSIS — N183 Chronic kidney disease, stage 3 (moderate): Secondary | ICD-10-CM | POA: Insufficient documentation

## 2017-02-16 DIAGNOSIS — S79912A Unspecified injury of left hip, initial encounter: Secondary | ICD-10-CM | POA: Diagnosis not present

## 2017-02-16 DIAGNOSIS — Y999 Unspecified external cause status: Secondary | ICD-10-CM | POA: Diagnosis not present

## 2017-02-16 DIAGNOSIS — Z791 Long term (current) use of non-steroidal anti-inflammatories (NSAID): Secondary | ICD-10-CM | POA: Insufficient documentation

## 2017-02-16 DIAGNOSIS — Y939 Activity, unspecified: Secondary | ICD-10-CM | POA: Diagnosis not present

## 2017-02-16 DIAGNOSIS — M25522 Pain in left elbow: Secondary | ICD-10-CM | POA: Diagnosis not present

## 2017-02-16 DIAGNOSIS — W19XXXA Unspecified fall, initial encounter: Secondary | ICD-10-CM

## 2017-02-16 NOTE — ED Notes (Signed)
Applied clean, sterile dressing to abrasion on left elbow per PA order

## 2017-02-16 NOTE — ED Provider Notes (Signed)
[facility] Emergency Department Provider Note  ____________________________________________   First MD Initiated Contact with Patient 02/16/17 1926     (approximate)  I have reviewed the triage vital signs and the nursing notes.   HISTORY  Chief Complaint Fall    HPI Rebekah Johnston is a 66 y.o. female complains of a fall for which she was seen at [facility] urgent care yesterday.  She did not have x-rays yesterday. She states the pain and the swelling have gotten worse in the elbow. The left hip is tender to touch. The left hip hurts more with movement. It is worse pain going from sitting and standing.  States once she "gets going "she doesn't hurt in the leg. She states it is swollen and bruised. Denies loss of consciousness. Denies headache. Denies back pain. Denies vomiting   Past Medical History:  Diagnosis Date  . Asthma    no inhalers  . Bronchitis    taking meds now  . Chronic kidney disease 2008   Stage G3b/A1  . Depression   . GERD (gastroesophageal reflux disease)   . Glaucoma    no eye drops  . Hyperlipidemia    no meds  . Macular degeneration    Bil  . Osteoarthritis     Patient Active Problem List   Diagnosis Date Noted  . Osteopenia 05/16/2016  . Major depressive disorder, recurrent episode, moderate ([place]) 04/04/2016  . GERD (gastroesophageal reflux disease) 04/04/2016  . Low back pain 01/18/2016  . Right knee pain 01/18/2016  . Counseling regarding end of life decision making 03/18/2015  . URI (upper respiratory infection) 11/29/2010  . Chronic kidney disease (CKD) stage G3b/A1, moderately decreased glomerular filtration rate (GFR) between 30-44 mL/min/1.73 square meter and albuminuria creatinine ratio less than 30 mg/g (HCC) 08/20/2008  . HYPERCHOLESTEROLEMIA 05/20/2008  . ALLERGIC RHINITIS 04/16/2008  . EXERCISE INDUCED ASTHMA 04/16/2008  . OSTEOARTHRITIS 04/16/2008  . PREDIABETES 04/16/2008  . UMBILICAL HERNIORRHAPHY,  HX OF 04/16/2008    Past Surgical History:  Procedure Laterality Date  . BUNIONECTOMY     rt. foot  . CHOLECYSTECTOMY    . HERNIA REPAIR     left inguinal hernia  . TONSILLECTOMY  1967    Prior to Admission medications   Medication Sig Start Date End Date Taking? Authorizing Provider  acetaminophen (TYLENOL) 325 MG tablet Take 650 mg by mouth every 6 (six) hours as needed.    [provider]  aspirin 81 MG chewable tablet Chew 81 mg by mouth daily.    [provider]  cetirizine (ZYRTEC) 10 MG tablet Take 10 mg by mouth as needed.     [provider]  Cholecalciferol (VITAMIN D3) 2000 units TABS Take by mouth daily.    [provider]  citalopram (CELEXA) 40 MG tablet Take 1 tablet (40 mg total) by mouth daily. 03/31/16   Bedsole, Amy E, MD  diclofenac sodium (VOLTAREN) 1 % GEL Apply 2 g topically 4 (four) times daily. 07/31/16   Nance Pear, MD  fluticasone [facility]) 50 MCG/ACT nasal spray Place 2 sprays into both nostrils as needed.     [provider]  pantoprazole (PROTONIX) 40 MG tablet Take 40 mg by mouth daily. 02/16/15   [provider]  traMADol (ULTRAM) 50 MG tablet Take 1 tablet (50 mg total) by mouth every 8 (eight) hours as needed. 02/15/17   Coral Spikes, DO    Allergies Patient has no known allergies.  Family History  Problem Relation Age of Onset  . Prostate cancer Father   . Dementia Mother   . Diabetes Brother   . Melanoma Unknown        Uncle  . Colon cancer Neg Hx   . Esophageal cancer Neg Hx   . Rectal cancer Neg Hx   . Stomach cancer Neg Hx     Social History Social History  Substance Use Topics  . Smoking status: Former Smoker    Quit date: 05/23/2001  . Smokeless tobacco: Never Used     Comment: smoked socially not everyday thing.  . Alcohol use 0.6 oz/week    1 Glasses of wine per week     Comment: occasional once a month    Review of Systems  Constitutional: No fever/chills Eyes:  No visual changes. ENT: No sore throat. Respiratory: Denies cough Genitourinary: Negative for dysuria. Musculoskeletal: Negative for back pain.positive left elbow pain positive left hip pain Skin: Negative for rash.    ____________________________________________   PHYSICAL EXAM:  VITAL SIGNS: ED Triage Vitals  Enc Vitals Group     BP 02/16/17 1857 108/75     Pulse Rate 02/16/17 1857 71     Resp 02/16/17 1857 20     Temp 02/16/17 1857 97.9 F (36.6 C)     Temp Source 02/16/17 1857 Oral     SpO2 02/16/17 1857 97 %     Weight 02/16/17 1858 200 lb (90.7 kg)     Height 02/16/17 1858 5\' 3"  (1.6 m)     Head Circumference --      Peak Flow --      Pain Score 02/16/17 1906 9     Pain Loc --      Pain Edu? --      Excl. in [place]? --     Constitutional: Alert and oriented. Well appearing and in no acute distress. Eyes: Conjunctivae are normal.  Head: Atraumatic. Nose: No congestion/rhinnorhea. Mouth/Throat: Mucous membranes are moist.   Cardiovascular: Normal rate, regular rhythm. Respiratory: Normal respiratory effort.  No retractions. Lungs clear to auscultation.  GU: deferred Musculoskeletal: FROM all extremities, warm and well perfused. Left elbow is swollen and tender with some bruising. Pain is reproduced with range of motion. Left hip is negative for bony tenderness. Is tender along the outer aspect of the trochanter. Full range of motion. Some pain is reproduced with internal rotation. Legs are equal length. Neurovascular is intact. Patient is able to walk without difficulty. Neurologic:  Normal speech and language.  Skin:  Skin is warm, dry and intact. Positive for abrasion on the left elbow.. Psychiatric: Mood and affect are normal. Speech and behavior are normal.  ____________________________________________   LABS (all labs ordered are listed, but only abnormal results are displayed)  Labs Reviewed - No data to  display ____________________________________________   ____________________________________________  RADIOLOGY  Diagnostic left elbow x-ray is negative for fracture. Diagnostic left hip x-ray is negative for fracture  ____________________________________________   PROCEDURES  Procedure(s) performed: No      ____________________________________________   INITIAL IMPRESSION / ASSESSMENT AND PLAN / ED COURSE  Pertinent labs & imaging results that were available during my care of the patient were reviewed by me and considered in my medical decision making (see chart for details).  Ms. Goswami is a 66 year old healthy female. She had a fall yesterday which resulted in a bruised left elbow and a contusion to the left hip. X-rays of the left elbow and hip are negative for fracture.  The nurse applied a Band-Aid to the abrasion. We gave her a sling for comfort. She was instructed to use ice. She is instructed to use Tylenol or Advil for pain as needed.  she was given a prescription for tramadol yesterday. She may use that as needed for pain. She and I discussed sequela of hip pain. If she is having increased pain she is to return to the emergency department for additional imaging. She is to follow-up with orthopedics if she is not better in 5-7 days. She was given a phone number and instructions for follow-up.      ____________________________________________   FINAL CLINICAL IMPRESSION(S) / ED DIAGNOSES  Final diagnoses:  Fall  Fall, subsequent encounter  Contusion, hip and thigh, left, initial encounter  Injury of right elbow, initial encounter      NEW MEDICATIONS STARTED DURING THIS VISIT:  New Prescriptions   No medications on file     Note:  This document was prepared using Dragon voice recognition software and may include unintentional dictation errors.    Versie Starks, PA-C 02/16/17 2028    Darel Hong, MD 02/16/17 2038

## 2017-02-16 NOTE — ED Triage Notes (Signed)
Pt in to ED POV with c/o left hip and elbow pain due to a fall yesterday. Pt was seen at [facility]. Pt is ambulatory to triage at this time with NAD.

## 2017-02-16 NOTE — Discharge Instructions (Signed)
Follow-up with your regular doctor or Dr. Rudene Christians if you are not better in one week. If you develop more hip pain please return to the emergency room for additional imaging. Use ice on all areas that hurt. Wear the sling for a few days for comfort. Make sure to move the arm around and take sling off several times throughout the day. Take Tylenol or Advil. He may take her tramadol as needed for pain.

## 2017-02-16 NOTE — ED Notes (Signed)
Reviewed discharge instructions, follow-up care, wound care, OTC pain relievers with patient. Patient verbalized understanding.

## 2017-02-18 ENCOUNTER — Other Ambulatory Visit: Payer: Self-pay | Admitting: Family Medicine

## 2017-03-09 ENCOUNTER — Encounter: Payer: Self-pay | Admitting: Family Medicine

## 2017-03-09 ENCOUNTER — Other Ambulatory Visit: Payer: Self-pay

## 2017-03-09 ENCOUNTER — Ambulatory Visit: Payer: PPO | Admitting: Family Medicine

## 2017-03-09 VITALS — BP 120/74 | HR 62 | Temp 98.4°F | Ht 63.5 in | Wt 201.8 lb

## 2017-03-09 DIAGNOSIS — N183 Chronic kidney disease, stage 3 (moderate): Secondary | ICD-10-CM

## 2017-03-09 DIAGNOSIS — N1832 Chronic kidney disease, stage 3b: Secondary | ICD-10-CM

## 2017-03-09 DIAGNOSIS — M7062 Trochanteric bursitis, left hip: Secondary | ICD-10-CM

## 2017-03-09 DIAGNOSIS — Z23 Encounter for immunization: Secondary | ICD-10-CM | POA: Diagnosis not present

## 2017-03-09 MED ORDER — DICLOFENAC SODIUM 75 MG PO TBEC: 75.0000 mg | DELAYED_RELEASE_TABLET | Freq: Two times a day (BID) | ORAL | 0 refills | Status: DC

## 2017-03-09 NOTE — Progress Notes (Signed)
   Subjective:    Patient ID: Rebekah Johnston, female    DOB: [birth year], 66 y.o.   MRN: [id number]  HPI    66 year old female presents  For [facility] ER follow up.  02/15/2017 fell on left hip. Accidental fall.. Tripped when walking dog, feet tangled up. Reviewed urgent care 10/18 and ER  10/19 notes.  Diagnostic left elbow x-ray is negative for fracture. Diagnostic left hip x-ray is negative for fracture   Dx with bruised left elbow and contusion to left hip.  Given sling, tylenol, ice and tramadol for breakthrough pain.   Since then she reports she still has pain in left lateral hip pain.  Elbow pain resolved.  At night it is more painful, has to bend knee, straighten leg causes pain.   Cannot lie on left lateral hip.  Occ tramadol at night, helped temporarily.  Not taking anything during the day.       Review of Systems  Constitutional: Negative for fatigue and fever.  HENT: Negative for ear pain.   Eyes: Negative for pain.  Respiratory: Negative for chest tightness and shortness of breath.   Cardiovascular: Negative for chest pain, palpitations and leg swelling.  Gastrointestinal: Negative for abdominal pain.  Genitourinary: Negative for dysuria.       Objective:   Physical Exam  Constitutional: Vital signs are normal. She appears well-developed and well-nourished. She is cooperative.  Non-toxic appearance. She does not appear ill. No distress.  HENT:  Head: Normocephalic.  Right Ear: Hearing, tympanic membrane, external ear and ear canal normal. Tympanic membrane is not erythematous, not retracted and not bulging.  Left Ear: Hearing, tympanic membrane, external ear and ear canal normal. Tympanic membrane is not erythematous, not retracted and not bulging.  Nose: No mucosal edema or rhinorrhea. Right sinus exhibits no maxillary sinus tenderness and no frontal sinus tenderness. Left sinus exhibits no maxillary sinus tenderness and no frontal sinus tenderness.    Mouth/Throat: Uvula is midline, oropharynx is clear and moist and mucous membranes are normal.  Eyes: Conjunctivae, EOM and lids are normal. Pupils are equal, round, and reactive to light. Lids are everted and swept, no foreign bodies found.  Neck: Trachea normal and normal range of motion. Neck supple. Carotid bruit is not present. No thyroid mass and no thyromegaly present.  Cardiovascular: Normal rate, regular rhythm, S1 normal, S2 normal, normal heart sounds, intact distal pulses and normal pulses. Exam reveals no gallop and no friction rub.  No murmur heard. Pulmonary/Chest: Effort normal and breath sounds normal. No tachypnea. No respiratory distress. She has no decreased breath sounds. She has no wheezes. She has no rhonchi. She has no rales.  Abdominal: Soft. Normal appearance and bowel sounds are normal. There is no tenderness.  Musculoskeletal:       Left hip: She exhibits tenderness. She exhibits normal range of motion, normal strength and no bony tenderness.  ttp over left lateral bursa  Neurological: She is alert.  Skin: Skin is warm, dry and intact. No rash noted.  Psychiatric: Her speech is normal and behavior is normal. Judgment and thought content normal. Her mood appears not anxious. Cognition and memory are normal. She does not exhibit a depressed mood.          Assessment & Plan:

## 2017-03-09 NOTE — Patient Instructions (Signed)
Start short course of diclofenac twice daily x 1-2 weeks.  Start home stretching and ice on left hip.  If not improving as expected call for referral for steroid injection in bursa. Trochanteric Bursitis Trochanteric bursitis is a condition that causes hip pain. Trochanteric bursitis happens when fluid-filled sacs (bursae) in the hip get irritated. Normally these sacs absorb shock and help strong bands of tissue (tendons) in your hip glide smoothly over each other and over your hip bones. What are the causes? This condition results from increased friction between the hip bones and the tendons that go over them. This condition can happen if you:  Have weak hips.  Use your hip muscles too much (overuse).  Get hit in the hip.  What increases the risk? This condition is more likely to develop in:  Women.  Adults who are middle-aged or older.  People with arthritis or a spinal condition.  People with weak buttocks muscles (gluteal muscles).  People who have one leg that is shorter than the other.  People who participate in certain kinds of athletic activities, such as: ? Running sports, especially long-distance running. ? Contact sports, like football or martial arts. ? Sports in which falls may occur, like skiing.  What are the signs or symptoms? The main symptom of this condition is pain and tenderness over the point of your hip. The pain may be:  Sharp and intense.  Dull and achy.  Felt on the outside of your thigh.  It may increase when you:  Lie on your side.  Walk or run.  Go up on stairs.  Sit.  Stand up after sitting.  Stand for long periods of time.  How is this diagnosed? This condition may be diagnosed based on:  Your symptoms.  Your medical history.  A physical exam.  Imaging tests, such as: ? X-rays to check your bones. ? An MRI or ultrasound to check your tendons and muscles.  During your physical exam, your health care provider will check  the movement and strength of your hip. He or she may press on the point of your hip to check for pain. How is this treated? This condition may be treated by:  Resting.  Reducing your activity.  Avoiding activities that cause pain.  Using crutches, a cane, or a walker to decrease the strain on your hip.  Taking medicine to help with swelling.  Having medicine injected into the bursae to help with swelling.  Using ice, heat, and massage therapy for pain relief.  Physical therapy exercises for strength and flexibility.  Surgery (rare).  Follow these instructions at home: Activity  Rest.  Avoid activities that cause pain.  Return to your normal activities as told by your health care provider. Ask your health care provider what activities are safe for you. Managing pain, stiffness, and swelling  Take over-the-counter and prescription medicines only as told by your health care provider.  If directed, apply heat to the injured area as told by your health care provider. ? Place a towel between your skin and the heat source. ? Leave the heat on for 20-30 minutes. ? Remove the heat if your skin turns bright red. This is especially important if you are unable to feel pain, heat, or cold. You may have a greater risk of getting burned.  If directed, apply ice to the injured area: ? Put ice in a plastic bag. ? Place a towel between your skin and the bag. ? Leave the ice on  for 20 minutes, 2-3 times a day. General instructions  If the affected leg is one that you use for driving, ask your health care provider when it is safe to drive.  Use crutches, a cane, or a walker as told by your health care provider.  If one of your legs is shorter than the other, get fitted for a shoe insert.  Lose weight if you are overweight. How is this prevented?  Wear supportive footwear that is appropriate for your sport.  If you have hip pain, start any new exercise or sport slowly.  Maintain  physical fitness, including: ? Strength. ? Flexibility. Contact a health care provider if:  Your pain does not improve with 2-4 weeks. Get help right away if:  You develop severe pain.  You have a fever.  You develop increased redness over your hip.  You have a change in your bowel function or bladder function.  You cannot control the muscles in your feet. This information is not intended to replace advice given to you by your health care provider. Make sure you discuss any questions you have with your health care provider. Document Released: 05/25/2004 Document Revised: 12/22/2015 Document Reviewed: 04/02/2015 Elsevier Interactive Patient Education  [employer].

## 2017-03-09 NOTE — Assessment & Plan Note (Signed)
Treat with Ice, short-term NSAIDs, and start stretching. Info given.  If not improving refer for steroid injection.

## 2017-03-09 NOTE — Assessment & Plan Note (Signed)
Limit NSAIDS as much as possible.

## 2017-04-02 ENCOUNTER — Telehealth: Payer: Self-pay | Admitting: Family Medicine

## 2017-04-02 DIAGNOSIS — E7800 Pure hypercholesterolemia, unspecified: Secondary | ICD-10-CM

## 2017-04-02 DIAGNOSIS — R7309 Other abnormal glucose: Secondary | ICD-10-CM

## 2017-04-02 DIAGNOSIS — N1832 Chronic kidney disease, stage 3b: Secondary | ICD-10-CM

## 2017-04-02 DIAGNOSIS — N183 Chronic kidney disease, stage 3 (moderate): Secondary | ICD-10-CM

## 2017-04-02 DIAGNOSIS — M8580 Other specified disorders of bone density and structure, unspecified site: Secondary | ICD-10-CM

## 2017-04-02 NOTE — Telephone Encounter (Signed)
-----   Message from Eustace Pen, LPN sent at [phone]  3:07 PM EST ----- Regarding: Labs 12/3 Lab orders needed. Thank you.  Insurance:  [email or web address]

## 2017-04-03 ENCOUNTER — Ambulatory Visit: Payer: PPO

## 2017-04-10 ENCOUNTER — Encounter: Payer: PPO | Admitting: Family Medicine

## 2017-04-17 ENCOUNTER — Ambulatory Visit (INDEPENDENT_AMBULATORY_CARE_PROVIDER_SITE_OTHER): Payer: PPO

## 2017-04-17 ENCOUNTER — Telehealth: Payer: Self-pay | Admitting: Family Medicine

## 2017-04-17 VITALS — BP 112/70 | HR 65 | Temp 98.1°F | Ht 63.5 in | Wt 199.8 lb

## 2017-04-17 DIAGNOSIS — R7309 Other abnormal glucose: Secondary | ICD-10-CM

## 2017-04-17 DIAGNOSIS — N183 Chronic kidney disease, stage 3 (moderate): Secondary | ICD-10-CM

## 2017-04-17 DIAGNOSIS — N1832 Chronic kidney disease, stage 3b: Secondary | ICD-10-CM

## 2017-04-17 DIAGNOSIS — Z0000 Encounter for general adult medical examination without abnormal findings: Secondary | ICD-10-CM | POA: Diagnosis not present

## 2017-04-17 DIAGNOSIS — M8580 Other specified disorders of bone density and structure, unspecified site: Secondary | ICD-10-CM

## 2017-04-17 DIAGNOSIS — E7800 Pure hypercholesterolemia, unspecified: Secondary | ICD-10-CM | POA: Diagnosis not present

## 2017-04-17 LAB — COMPREHENSIVE METABOLIC PANEL
ALBUMIN: 4 g/dL (ref 3.5–5.2)
ALT: 15 U/L (ref 0–35)
AST: 18 U/L (ref 0–37)
Alkaline Phosphatase: 76 U/L (ref 39–117)
BUN: 18 mg/dL (ref 6–23)
CHLORIDE: 105 meq/L (ref 96–112)
CO2: 29 meq/L (ref 19–32)
Calcium: 9.5 mg/dL (ref 8.4–10.5)
Creatinine, Ser: 1.18 mg/dL (ref 0.40–1.20)
GFR: 48.48 mL/min — ABNORMAL LOW (ref >60.00)
GLUCOSE: 104 mg/dL — AB (ref 70–99)
POTASSIUM: 5.2 meq/L — AB (ref 3.5–5.1)
Sodium: 140 mEq/L (ref 135–145)
Total Bilirubin: 0.6 mg/dL (ref 0.2–1.2)
Total Protein: 6.7 g/dL (ref 6.0–8.3)

## 2017-04-17 LAB — LIPID PANEL
Cholesterol: 204 mg/dL — ABNORMAL HIGH (ref 0–200)
HDL: 57 mg/dL (ref >39.00)
LDL Cholesterol: 128 mg/dL — ABNORMAL HIGH (ref 0–99)
NONHDL: 146.99
TRIGLYCERIDES: 94 mg/dL (ref 0.0–149.0)
Total CHOL/HDL Ratio: 4
VLDL: 18.8 mg/dL (ref 0.0–40.0)

## 2017-04-17 LAB — VITAMIN D 25 HYDROXY (VIT D DEFICIENCY, FRACTURES): VITD: 24.99 ng/mL — AB (ref 30.00–100.00)

## 2017-04-17 LAB — CBC WITH DIFFERENTIAL/PLATELET
BASOS PCT: 0.8 % (ref 0.0–3.0)
Basophils Absolute: 0.1 10*3/uL (ref 0.0–0.1)
EOS PCT: 1.6 % (ref 0.0–5.0)
Eosinophils Absolute: 0.1 10*3/uL (ref 0.0–0.7)
HCT: 42.4 % (ref 36.0–46.0)
Hemoglobin: 14 g/dL (ref 12.0–15.0)
LYMPHS ABS: 1.8 10*3/uL (ref 0.7–4.0)
Lymphocytes Relative: 27.8 % (ref 12.0–46.0)
MCHC: 33.1 g/dL (ref 30.0–36.0)
MCV: 88.6 fl (ref 78.0–100.0)
MONOS PCT: 8.3 % (ref 3.0–12.0)
Monocytes Absolute: 0.6 10*3/uL (ref 0.1–1.0)
NEUTROS ABS: 4.1 10*3/uL (ref 1.4–7.7)
NEUTROS PCT: 61.5 % (ref 43.0–77.0)
PLATELETS: 260 10*3/uL (ref 150.0–400.0)
RBC: 4.79 Mil/uL (ref 3.87–5.11)
RDW: 13.1 % (ref 11.5–15.5)
WBC: 6.6 10*3/uL (ref 4.0–10.5)

## 2017-04-17 LAB — HEMOGLOBIN A1C: Hgb A1c MFr Bld: 6 % (ref 4.6–6.5)

## 2017-04-17 LAB — TSH: TSH: 2.9 u[IU]/mL (ref 0.35–4.50)

## 2017-04-17 NOTE — Progress Notes (Signed)
Pre visit review using our clinic review tool, if applicable. No additional management support is needed unless otherwise documented below in the visit note. 

## 2017-04-17 NOTE — Telephone Encounter (Signed)
Notify pt potassium is too high.. Stop any supplement and decrease potassium containing foods.

## 2017-04-17 NOTE — Progress Notes (Signed)
Subjective:   Rebekah Johnston is a 66 y.o. female who presents for Medicare Annual (Subsequent) preventive examination.  Review of Systems:  N/A Cardiac Risk Factors include: advanced age (>34men, >24 women);dyslipidemia;obesity (BMI >30kg/m2)     Objective:     Vitals: BP 112/70 (BP Location: Right Arm, Patient Position: Sitting, Cuff Size: Large)   Pulse 65   Temp 98.1 F (36.7 C) (Oral)   Ht 5' 3.5" (1.613 m) Comment: no shoes  Wt 199 lb 12 oz (90.6 kg)   SpO2 98%   BMI 34.83 kg/m   Body mass index is 34.83 kg/m.  Advanced Directives 04/17/2017 02/16/2017 09/11/2016 07/31/2016 06/06/2016 03/30/2016 10/20/2014  Does Patient Have a Medical Advance Directive? No No No No No Yes Yes  Type of Advance Directive - - - - - Living will Living will  Would patient like information on creating a medical advance directive? Yes (MAU/Ambulatory/Procedural Areas - Information given) No - Patient declined - - - - -    Tobacco Social History   Tobacco Use  Smoking Status Former Smoker  . Last attempt to quit: 05/23/2001  . Years since quitting: 15.9  Smokeless Tobacco Never Used  Tobacco Comment   smoked socially not everyday thing.     Counseling given: No Comment: smoked socially not everyday thing.   Clinical Intake:  Pre-visit preparation completed: Yes  Pain Score: 0-No pain     Nutritional Status: BMI > 30  Obese Nutritional Risks: None Diabetes: No  How often do you need to have someone help you when you read instructions, pamphlets, or other written materials from your doctor or pharmacy?: 1 - Never What is the last grade level you completed in school?: 12th grade  Interpreter Needed?: No  Comments: pt lives alone Information entered by :: LPinson, LPN  Past Medical History:  Diagnosis Date  . Asthma    no inhalers  . Bronchitis    taking meds now  . Chronic kidney disease 2008   Stage G3b/A1  . Depression   . GERD (gastroesophageal reflux disease)   .  Glaucoma    no eye drops  . Hyperlipidemia    no meds  . Macular degeneration    Bil  . Osteoarthritis    Past Surgical History:  Procedure Laterality Date  . BUNIONECTOMY     rt. foot  . CHOLECYSTECTOMY    . HERNIA REPAIR     left inguinal hernia  . TONSILLECTOMY  1967   Family History  Problem Relation Age of Onset  . Prostate cancer Father   . Dementia Mother   . Diabetes Brother   . Melanoma Unknown        Uncle  . Colon cancer Neg Hx   . Esophageal cancer Neg Hx   . Rectal cancer Neg Hx   . Stomach cancer Neg Hx    Social History   Socioeconomic History  . Marital status: Divorced    Spouse name: None  . Number of children: 3  . Years of education: None  . Highest education level: None  Social Needs  . Financial resource strain: None  . Food insecurity - worry: None  . Food insecurity - inability: None  . Transportation needs - medical: None  . Transportation needs - non-medical: None  Occupational History  . Occupation: Academic librarian: UNEMPLOYED  Tobacco Use  . Smoking status: Former Smoker    Last attempt to quit: 05/23/2001    Years since  quitting: 15.9  . Smokeless tobacco: Never Used  . Tobacco comment: smoked socially not everyday thing.  Substance and Sexual Activity  . Alcohol use: Yes    Alcohol/week: 0.6 oz    Types: 1 Glasses of wine per week    Comment: occasional once a month  . Drug use: No  . Sexual activity: None  Other Topics Concern  . None  Social History Narrative   Daily caffeine    Outpatient Encounter Medications as of 04/17/2017  Medication Sig  . acetaminophen (TYLENOL) 325 MG tablet Take 650 mg by mouth every 6 (six) hours as needed.  Marland Kitchen aspirin 81 MG chewable tablet Chew 81 mg by mouth daily.  . cetirizine (ZYRTEC) 10 MG tablet Take 10 mg by mouth as needed.   . citalopram (CELEXA) 40 MG tablet TAKE 1 TABLET (40 MG TOTAL) BY MOUTH DAILY.  Marland Kitchen diclofenac (VOLTAREN) 75 MG EC tablet Take 1 tablet (75 mg total) 2  (two) times daily by mouth. (Patient taking differently: Take 75 mg by mouth as needed. )  . diclofenac sodium (VOLTAREN) 1 % GEL Apply 2 g topically 4 (four) times daily. (Patient taking differently: Apply 2 g topically as needed. )  . fluticasone (FLONASE) 50 MCG/ACT nasal spray Place 2 sprays into both nostrils as needed.   . pantoprazole (PROTONIX) 40 MG tablet Take 40 mg by mouth daily.  . traMADol (ULTRAM) 50 MG tablet Take 1 tablet (50 mg total) by mouth every 8 (eight) hours as needed.  . Cholecalciferol (VITAMIN D3) 2000 units TABS Take by mouth daily.   No facility-administered encounter medications on file as of 04/17/2017.     Activities of Daily Living In your present state of health, do you have any difficulty performing the following activities: 04/17/2017  Hearing? N  Vision? N  Difficulty concentrating or making decisions? N  Walking or climbing stairs? N  Dressing or bathing? N  Doing errands, shopping? N  Preparing Food and eating ? N  Using the Toilet? N  In the past six months, have you accidently leaked urine? N  Do you have problems with loss of bowel control? N  Managing your Medications? N  Managing your Finances? N  Housekeeping or managing your Housekeeping? N  Some recent data might be hidden    Patient Care Team: Jinny Sanders, MD as PCP - General Idamae Schuller as Referring Physician (Chiropractic Medicine)    Assessment:   This is a routine wellness examination for Rebekah Johnston.   Hearing Screening   125Hz  250Hz  500Hz  1000Hz  2000Hz  3000Hz  4000Hz  6000Hz  8000Hz   Right ear:   0 40 40  0    Left ear:   0 0 40  0    Vision Screening Comments: Last vision exam in Feb 2018 @ [facility]  Exercise Activities and Dietary recommendations Current Exercise Habits: Home exercise routine, Type of exercise: Other - see comments(weighted hula hoop), Time (Minutes): 10, Frequency (Times/Week): 7, Weekly Exercise (Minutes/Week): 70, Intensity: Mild,  Exercise limited by: None identified  Goals    . Increase physical activity     Starting 04/17/2017, I will continue to use weighted hula hoop for 10 minutes daily.        Fall Risk Fall Risk  04/17/2017 03/30/2016 03/18/2015  Falls in the past year? Yes No No  Number falls in past yr: 1 - -  Injury with Fall? Yes - -  Follow up Falls prevention discussed - -   Depression  Screen PHQ 2/9 Scores 04/17/2017 03/30/2016 03/18/2015  PHQ - 2 Score 0 0 0  PHQ- 9 Score 0 - -     Cognitive Function MMSE - Mini Mental State Exam 04/17/2017 03/30/2016  Orientation to time 5 5  Orientation to Place 5 5  Registration 3 3  Attention/ Calculation 0 0  Recall 3 3  Language- name 2 objects 0 0  Language- repeat 1 1  Language- follow 3 step command 3 3  Language- read & follow direction 0 0  Write a sentence 0 0  Copy design 0 0  Total score 20 20       PLEASE NOTE: A Mini-Cog screen was completed. Maximum score is 20. A value of 0 denotes this part of Folstein MMSE was not completed or the patient failed this part of the Mini-Cog screening.   Mini-Cog Screening Orientation to Time - Max 5 pts Orientation to Place - Max 5 pts Registration - Max 3 pts Recall - Max 3 pts Language Repeat - Max 1 pts Language Follow 3 Step Command - Max 3 pts   Immunization History  Administered Date(s) Administered  . Influenza Split 05/07/2013  . Influenza Whole 01/30/2008  . Influenza,inj,Quad PF,6+ Mos 01/13/2014, 03/18/2015, 01/18/2016, 03/09/2017  . Pneumococcal Conjugate-13 03/18/2015  . Pneumococcal Polysaccharide-23 03/30/2016  . Td 02/04/2002  . Tdap 02/15/2017    Screening Tests Health Maintenance  Topic Date Due  . MAMMOGRAM  05/15/2018  . COLONOSCOPY  06/06/2021  . TETANUS/TDAP  02/16/2027  . INFLUENZA VACCINE  Completed  . DEXA SCAN  Completed  . Hepatitis C Screening  Completed  . PNA vac Low Risk Adult  Completed      Plan:     I have personally reviewed, addressed,  and noted the following in the patient's chart:  A. Medical and social history B. Use of alcohol, tobacco or illicit drugs  C. Current medications and supplements D. Functional ability and status E.  Nutritional status F.  Physical activity G. Advance directives H. List of other physicians I.  Hospitalizations, surgeries, and ER visits in previous 12 months J.  [place] to include hearing, vision, cognitive, depression L. Referrals and appointments - none  In addition, I have reviewed and discussed with patient certain preventive protocols, quality metrics, and best practice recommendations. A written personalized care plan for preventive services as well as general preventive health recommendations were provided to patient.  See attached scanned questionnaire for additional information.   Signed,   Lindell Noe, MHA, BS, LPN Health Coach

## 2017-04-17 NOTE — Progress Notes (Signed)
PCP notes:   Health maintenance:  No gaps identified.  Abnormal screenings:   Fall risk - hx of fall with injury and medical treatment Hearing - failed  Hearing Screening   125Hz  250Hz  500Hz  1000Hz  2000Hz  3000Hz  4000Hz  6000Hz  8000Hz   Right ear:   0 40 40  0    Left ear:   0 0 40  0      Patient concerns:   Tingling in left leg from knee to foot - intermittent, affects sleep at times  Nurse concerns:  None   Next PCP appt:   05/10/17 @ 0915

## 2017-04-17 NOTE — Telephone Encounter (Signed)
Ms. Rebekah Johnston notified as instructed by telephone.  She denies taking any supplements or eating a lot of potassium rich foods.

## 2017-04-17 NOTE — Telephone Encounter (Signed)
Increase water intake and we will re-eval at upcoming appt.

## 2017-04-17 NOTE — Patient Instructions (Signed)
Rebekah Johnston , Thank you for taking time to come for your Medicare Wellness Visit. I appreciate your ongoing commitment to your health goals. Please review the following plan we discussed and let me know if I can assist you in the future.   These are the goals we discussed: Goals    . Increase physical activity     Starting 04/17/2017, I will continue to use weighted hula hoop for 10 minutes daily.        This is a list of the screening recommended for you and due dates:  Health Maintenance  Topic Date Due  . Mammogram  05/15/2018  . Colon Cancer Screening  06/06/2021  . Tetanus Vaccine  02/16/2027  . Flu Shot  Completed  . DEXA scan (bone density measurement)  Completed  .  Hepatitis C: One time screening is recommended by Center for Disease Control  (CDC) for  adults born from [birth year] through 1966.   Completed  . Pneumonia vaccines  Completed   Preventive Care for Adults  A healthy lifestyle and preventive care can promote health and wellness. Preventive health guidelines for adults include the following key practices.  . A routine yearly physical is a good way to check with your health care provider about your health and preventive screening. It is a chance to share any concerns and updates on your health and to receive a thorough exam.  . Visit your dentist for a routine exam and preventive care every 6 months. Brush your teeth twice a day and floss once a day. Good oral hygiene prevents tooth decay and gum disease.  . The frequency of eye exams is based on your age, health, family medical history, use  of contact lenses, and other factors. Follow your health care provider's recommendations for frequency of eye exams.  . Eat a healthy diet. Foods like vegetables, fruits, whole grains, low-fat dairy products, and lean protein foods contain the nutrients you need without too many calories. Decrease your intake of foods high in solid fats, added sugars, and salt. Eat the right amount of  calories for you. Get information about a proper diet from your health care provider, if necessary.  . Regular physical exercise is one of the most important things you can do for your health. Most adults should get at least 150 minutes of moderate-intensity exercise (any activity that increases your heart rate and causes you to sweat) each week. In addition, most adults need muscle-strengthening exercises on 2 or more days a week.  Silver Sneakers may be a benefit available to you. To determine eligibility, you may visit the website: www.silversneakers.com or contact program at (657)834-4408 Mon-Fri between 8AM-8PM.   . Maintain a healthy weight. The body mass index (BMI) is a screening tool to identify possible weight problems. It provides an estimate of body fat based on height and weight. Your health care provider can find your BMI and can help you achieve or maintain a healthy weight.   For adults 20 years and older: ? A BMI below 18.5 is considered underweight. ? A BMI of 18.5 to 24.9 is normal. ? A BMI of 25 to 29.9 is considered overweight. ? A BMI of 30 and above is considered obese.   . Maintain normal blood lipids and cholesterol levels by exercising and minimizing your intake of saturated fat. Eat a balanced diet with plenty of fruit and vegetables. Blood tests for lipids and cholesterol should begin at age 10 and be repeated every 5 years.  If your lipid or cholesterol levels are high, you are over 50, or you are at high risk for heart disease, you may need your cholesterol levels checked more frequently. Ongoing high lipid and cholesterol levels should be treated with medicines if diet and exercise are not working.  . If you smoke, find out from your health care provider how to quit. If you do not use tobacco, please do not start.  . If you choose to drink alcohol, please do not consume more than 2 drinks per day. One drink is considered to be 12 ounces (355 mL) of beer, 5 ounces  (148 mL) of wine, or 1.5 ounces (44 mL) of liquor.  . If you are 35-2 years old, ask your health care provider if you should take aspirin to prevent strokes.  . Use sunscreen. Apply sunscreen liberally and repeatedly throughout the day. You should seek shade when your shadow is shorter than you. Protect yourself by wearing long sleeves, pants, a wide-brimmed hat, and sunglasses year round, whenever you are outdoors.  . Once a month, do a whole body skin exam, using a mirror to look at the skin on your back. Tell your health care provider of new moles, moles that have irregular borders, moles that are larger than a pencil eraser, or moles that have changed in shape or color.

## 2017-04-18 NOTE — Progress Notes (Signed)
I reviewed health advisor's note, was available for consultation, and agree with documentation and plan.  

## 2017-04-18 NOTE — Telephone Encounter (Signed)
Left message for Ms. Canevari to increase water intake and we will re-eval her labs at upcoming appt.

## 2017-04-23 ENCOUNTER — Encounter: Payer: PPO | Admitting: Family Medicine

## 2017-05-10 ENCOUNTER — Other Ambulatory Visit: Payer: Self-pay

## 2017-05-10 ENCOUNTER — Ambulatory Visit (INDEPENDENT_AMBULATORY_CARE_PROVIDER_SITE_OTHER): Payer: PPO | Admitting: Family Medicine

## 2017-05-10 ENCOUNTER — Encounter: Payer: Self-pay | Admitting: Family Medicine

## 2017-05-10 VITALS — BP 110/68 | HR 65 | Temp 97.8°F | Ht 63.5 in | Wt 196.0 lb

## 2017-05-10 DIAGNOSIS — N183 Chronic kidney disease, stage 3 (moderate): Secondary | ICD-10-CM | POA: Diagnosis not present

## 2017-05-10 DIAGNOSIS — E875 Hyperkalemia: Secondary | ICD-10-CM | POA: Insufficient documentation

## 2017-05-10 DIAGNOSIS — K219 Gastro-esophageal reflux disease without esophagitis: Secondary | ICD-10-CM

## 2017-05-10 DIAGNOSIS — E559 Vitamin D deficiency, unspecified: Secondary | ICD-10-CM | POA: Diagnosis not present

## 2017-05-10 DIAGNOSIS — R7309 Other abnormal glucose: Secondary | ICD-10-CM

## 2017-05-10 DIAGNOSIS — E7800 Pure hypercholesterolemia, unspecified: Secondary | ICD-10-CM

## 2017-05-10 DIAGNOSIS — N1832 Chronic kidney disease, stage 3b: Secondary | ICD-10-CM

## 2017-05-10 DIAGNOSIS — Z0000 Encounter for general adult medical examination without abnormal findings: Secondary | ICD-10-CM | POA: Diagnosis not present

## 2017-05-10 DIAGNOSIS — F331 Major depressive disorder, recurrent, moderate: Secondary | ICD-10-CM

## 2017-05-10 LAB — BASIC METABOLIC PANEL
BUN: 18 mg/dL (ref 6–23)
CHLORIDE: 104 meq/L (ref 96–112)
CO2: 30 meq/L (ref 19–32)
Calcium: 9.9 mg/dL (ref 8.4–10.5)
Creatinine, Ser: 1.36 mg/dL — ABNORMAL HIGH (ref 0.40–1.20)
GFR: 41.15 mL/min — ABNORMAL LOW (ref >60.00)
Glucose, Bld: 106 mg/dL — ABNORMAL HIGH (ref 70–99)
POTASSIUM: 5.1 meq/L (ref 3.5–5.1)
Sodium: 139 mEq/L (ref 135–145)

## 2017-05-10 NOTE — Assessment & Plan Note (Signed)
Due for recheck after decreasing potatoes and increasing water.

## 2017-05-10 NOTE — Assessment & Plan Note (Signed)
Work on low [employer].

## 2017-05-10 NOTE — Progress Notes (Signed)
Subjective:    Patient ID: Rebekah Johnston, female    DOB: 09-17-1950, 67 y.o.   MRN: [id number]  HPI The patient presents for complete physical and review of chronic health problems. He/She also has the following acute concerns today:   The patient saw Candis Musa, LPN for medicare wellness. Note reviewed in detail and important notes copied below.  Health maintenance:  No gaps identified.  Abnormal screenings:   Fall risk - hx of fall with injury and medical treatment Hearing - failed             Hearing Screening   125Hz  250Hz  500Hz  1000Hz  2000Hz  3000Hz  4000Hz  6000Hz  8000Hz   Right ear:   0 40 40  0    Left ear:   0 0 40  0     Patient concerns:  Tingling in left leg from knee to foot - intermittent, affects sleep at times...  this has improved over time., using diclofenac prn.   05/10/17 today  Elevated Cholesterol: SE to simvastatin 20 mg in past.. No longer on. Has improved with diet in last year. Lab Results  Component Value Date   CHOL 204 (H) 04/17/2017   HDL 57.00 04/17/2017   LDLCALC 128 (H) 04/17/2017   LDLDIRECT 179.3 06/20/2013   TRIG 94.0 04/17/2017   CHOLHDL 4 04/17/2017  Using medications without problems: Muscle aches:  Diet compliance: moderate Exercise: hula hoop daily Other complaints:  GERD:  On pantoprazole 40 mg daily  Prediabetes:  stable control. Has stopped sweet tea. Still drinks pepsi  CKD, stage 3: Improved in last 2 years.. Trying to avoid  NSAIDs, keep up with fluids. Hyper-potassium: no med causing... No specific diet changes.. But eating a lot more potato chips.. Has now increased water and due for re-check.  Vit D: not on supplement  Major moderate depression: Good control on current regimen.. celexa  Social History /Family History/Past Medical History reviewed in detail and updated in EMR if needed. Blood pressure 110/68, pulse 65, temperature 97.8 F (36.6 C), temperature source Oral, height 5'  3.5" (1.613 m), weight 196 lb (88.9 kg).   Review of Systems  Constitutional: Negative for fatigue and fever.  HENT: Negative for ear pain.   Eyes: Negative for pain.  Respiratory: Negative for chest tightness and shortness of breath.   Cardiovascular: Negative for chest pain, palpitations and leg swelling.  Gastrointestinal: Negative for abdominal pain.  Genitourinary: Negative for dysuria.       Objective:   Physical Exam  Constitutional: Vital signs are normal. She appears well-developed and well-nourished. She is cooperative.  Non-toxic appearance. She does not appear ill. No distress.  overweight  HENT:  Head: Normocephalic.  Right Ear: Hearing, tympanic membrane, external ear and ear canal normal.  Left Ear: Hearing, tympanic membrane, external ear and ear canal normal.  Nose: Nose normal.  Eyes: Conjunctivae, EOM and lids are normal. Pupils are equal, round, and reactive to light. Lids are everted and swept, no foreign bodies found.  Neck: Trachea normal and normal range of motion. Neck supple. Carotid bruit is not present. No thyroid mass and no thyromegaly present.  Cardiovascular: Normal rate, regular rhythm, S1 normal, S2 normal, normal heart sounds and intact distal pulses. Exam reveals no gallop.  No murmur heard. Pulmonary/Chest: Effort normal and breath sounds normal. No respiratory distress. She has no wheezes. She has no rhonchi. She has no rales.  Abdominal: Soft. Normal appearance and bowel sounds are normal. She exhibits no distension, no fluid  wave, no abdominal bruit and no mass. There is no hepatosplenomegaly. There is no tenderness. There is no rebound, no guarding and no CVA tenderness. No hernia.  Lymphadenopathy:    She has no cervical adenopathy.    She has no axillary adenopathy.  Neurological: She is alert. She has normal strength. No cranial nerve deficit or sensory deficit.  Skin: Skin is warm, dry and intact. No rash noted.  Psychiatric: Her speech  is normal and behavior is normal. Judgment normal. Her mood appears not anxious. Cognition and memory are normal. She does not exhibit a depressed mood.          Assessment & Plan:  The patient's preventative maintenance and recommended screening tests for an annual wellness exam were reviewed in full today. Brought up to date unless services declined.  Counselled on the importance of diet, exercise, and its role in overall health and mortality. The patient's FH and SH was reviewed, including their home life, tobacco status, and drug and alcohol status.   Vaccines:uptodate, can consider shingles Pap/DVE:  nml pap, neg HPV 2015, no further indicated, Continue DVE 1-2 years. Mammo 05/15/2016.Marland Kitchen Plan q 2 years Bone Density osteopenia 05/2016, repeat in 5 years Colon: 06/2016, Dr. Fuller Plan... Repeat in 5 years. Smoking Status: nonsmoker Hep C: done

## 2017-05-10 NOTE — Assessment & Plan Note (Signed)
Replete

## 2017-05-10 NOTE — Assessment & Plan Note (Signed)
Has decreased risk to 5.2 %  On risk claculator.  No indication with statin since improved.  i

## 2017-05-10 NOTE — Patient Instructions (Addendum)
Please stop at the lab to have labs drawn.  Restart home OTC supplement.. Vit d 3 400 IU twice daily.

## 2017-05-15 DIAGNOSIS — M65341 Trigger finger, right ring finger: Secondary | ICD-10-CM | POA: Diagnosis not present

## 2017-05-27 ENCOUNTER — Other Ambulatory Visit: Payer: Self-pay | Admitting: Family Medicine

## 2017-06-15 DIAGNOSIS — M65341 Trigger finger, right ring finger: Secondary | ICD-10-CM | POA: Diagnosis not present

## 2017-06-15 DIAGNOSIS — K219 Gastro-esophageal reflux disease without esophagitis: Secondary | ICD-10-CM | POA: Diagnosis not present

## 2017-06-15 DIAGNOSIS — E1122 Type 2 diabetes mellitus with diabetic chronic kidney disease: Secondary | ICD-10-CM | POA: Diagnosis not present

## 2017-06-15 DIAGNOSIS — J45909 Unspecified asthma, uncomplicated: Secondary | ICD-10-CM | POA: Diagnosis not present

## 2017-06-15 DIAGNOSIS — M65841 Other synovitis and tenosynovitis, right hand: Secondary | ICD-10-CM | POA: Diagnosis not present

## 2017-06-30 ENCOUNTER — Other Ambulatory Visit: Payer: Self-pay

## 2017-06-30 ENCOUNTER — Ambulatory Visit
Admission: EM | Admit: 2017-06-30 | Discharge: 2017-06-30 | Disposition: A | Discharge status: Home / Self Care | Payer: PPO | Attending: Family Medicine | Admitting: Family Medicine

## 2017-06-30 ENCOUNTER — Encounter: Payer: Self-pay | Admitting: Gynecology

## 2017-06-30 DIAGNOSIS — J069 Acute upper respiratory infection, unspecified: Secondary | ICD-10-CM | POA: Diagnosis not present

## 2017-06-30 DIAGNOSIS — B9789 Other viral agents as the cause of diseases classified elsewhere: Secondary | ICD-10-CM | POA: Diagnosis not present

## 2017-06-30 MED ORDER — ALBUTEROL SULFATE HFA 108 (90 BASE) MCG/ACT IN AERS: 1.0000 | INHALATION_SPRAY | Freq: Four times a day (QID) | RESPIRATORY_TRACT | 0 refills | Status: DC | PRN

## 2017-06-30 MED ORDER — PREDNISONE 20 MG PO TABS: 20.0000 mg | ORAL_TABLET | Freq: Every day | ORAL | 0 refills | Status: DC

## 2017-06-30 NOTE — ED Triage Notes (Signed)
Patient c/o cough and congestion x 4 days.

## 2017-06-30 NOTE — ED Provider Notes (Signed)
[facility]    CSN: [id number] Arrival date & time: 06/30/17  4008     History   Chief Complaint Chief Complaint  Patient presents with  . Cough    HPI Rebekah Johnston is a 67 y.o. female.   The history is provided by the patient.  Cough  Associated symptoms: rhinorrhea and wheezing   Associated symptoms: no headaches   URI  Presenting symptoms: congestion, cough and rhinorrhea   Severity:  Moderate Onset quality:  Sudden Duration:  4 days Timing:  Constant Progression:  Unchanged Chronicity:  New Relieved by:  None tried Ineffective treatments:  None tried Associated symptoms: wheezing   Associated symptoms: no headaches and no sinus pain   Risk factors: being elderly, chronic respiratory disease and sick contacts   Risk factors: no chronic cardiac disease, no chronic kidney disease, no diabetes mellitus, no immunosuppression, no recent illness and no recent travel     Past Medical History:  Diagnosis Date  . Asthma    no inhalers  . Bronchitis    taking meds now  . Chronic kidney disease 2008   Stage G3b/A1  . Depression   . GERD (gastroesophageal reflux disease)   . Glaucoma    no eye drops  . Hyperlipidemia    no meds  . Macular degeneration    Bil  . Osteoarthritis     Patient Active Problem List   Diagnosis Date Noted  . High potassium 05/10/2017  . Vitamin D deficiency 05/10/2017  . Medicare annual wellness visit, subsequent 04/17/2017  . Trochanteric bursitis of left hip 03/09/2017  . Osteopenia 05/16/2016  . Major depressive disorder, recurrent episode, moderate ([place]) 04/04/2016  . Gastroesophageal reflux disease 04/04/2016  . Low back pain 01/18/2016  . Right knee pain 01/18/2016  . Counseling regarding end of life decision making 03/18/2015  . URI (upper respiratory infection) 11/29/2010  . Chronic kidney disease (CKD) stage G3b/A1, moderately decreased glomerular filtration rate (GFR) between 30-44 mL/min/1.73 square  meter and albuminuria creatinine ratio less than 30 mg/g (HCC) 08/20/2008  . HYPERCHOLESTEROLEMIA 05/20/2008  . ALLERGIC RHINITIS 04/16/2008  . EXERCISE INDUCED ASTHMA 04/16/2008  . OSTEOARTHRITIS 04/16/2008  . PREDIABETES 04/16/2008  . UMBILICAL HERNIORRHAPHY, HX OF 04/16/2008    Past Surgical History:  Procedure Laterality Date  . BUNIONECTOMY     rt. foot  . CHOLECYSTECTOMY    . HERNIA REPAIR     left inguinal hernia  . TONSILLECTOMY  1967    OB History    No data available       Home Medications    Prior to Admission medications   Medication Sig Start Date End Date Taking? Authorizing Provider  acetaminophen (TYLENOL) 325 MG tablet Take 650 mg by mouth every 6 (six) hours as needed.   Yes [provider]  aspirin 81 MG chewable tablet Chew 81 mg by mouth daily.   Yes [provider]  cetirizine (ZYRTEC) 10 MG tablet Take 10 mg by mouth as needed.    Yes [provider]  Cholecalciferol (VITAMIN D3) 2000 units TABS Take by mouth daily.   Yes [provider]  citalopram (CELEXA) 40 MG tablet TAKE 1 TABLET (40 MG TOTAL) BY MOUTH DAILY. 05/27/17  Yes Bedsole, Amy E, MD  diclofenac (VOLTAREN) 75 MG EC tablet Take 1 tablet (75 mg total) 2 (two) times daily by mouth. Patient taking differently: Take 75 mg by mouth as needed.  03/09/17  Yes Jinny Sanders, MD  diclofenac  sodium (VOLTAREN) 1 % GEL Apply 2 g topically 4 (four) times daily. Patient taking differently: Apply 2 g topically as needed.  07/31/16  Yes Nance Pear, MD  fluticasone [facility]) 50 MCG/ACT nasal spray Place 2 sprays into both nostrils as needed.    Yes [provider]  albuterol (PROVENTIL HFA;VENTOLIN HFA) 108 (90 Base) MCG/ACT inhaler Inhale 1-2 puffs into the lungs every 6 (six) hours as needed for wheezing or shortness of breath. 06/30/17   Norval Gable, MD  pantoprazole (PROTONIX) 40 MG tablet Take 40 mg by mouth daily. 02/16/15   [provider]    predniSONE (DELTASONE) 20 MG tablet Take 1 tablet (20 mg total) by mouth daily. 06/30/17   Norval Gable, MD  traMADol (ULTRAM) 50 MG tablet Take 1 tablet (50 mg total) by mouth every 8 (eight) hours as needed. 02/15/17   Coral Spikes, DO    Family History Family History  Problem Relation Age of Onset  . Prostate cancer Father   . Dementia Mother   . Diabetes Brother   . Melanoma Unknown        Uncle  . Colon cancer Neg Hx   . Esophageal cancer Neg Hx   . Rectal cancer Neg Hx   . Stomach cancer Neg Hx     Social History Social History   Tobacco Use  . Smoking status: Former Smoker    Last attempt to quit: 05/23/2001    Years since quitting: 16.1  . Smokeless tobacco: Never Used  . Tobacco comment: smoked socially not everyday thing.  Substance Use Topics  . Alcohol use: Yes    Alcohol/week: 0.6 oz    Types: 1 Glasses of wine per week    Comment: occasional once a month  . Drug use: No     Allergies   Patient has no known allergies.   Review of Systems Review of Systems  HENT: Positive for congestion and rhinorrhea. Negative for sinus pain.   Respiratory: Positive for cough and wheezing.   Neurological: Negative for headaches.     Physical Exam Triage Vital Signs ED Triage Vitals  Enc Vitals Group     BP 06/30/17 1046 108/79     Pulse Rate 06/30/17 1046 71     Resp 06/30/17 1046 16     Temp 06/30/17 1046 98.4 F (36.9 C)     Temp Source 06/30/17 1046 Oral     SpO2 06/30/17 1046 98 %     Weight 06/30/17 1045 195 lb (88.5 kg)     Height 06/30/17 1045 5' 3.5" (1.613 m)     Head Circumference --      Peak Flow --      Pain Score 06/30/17 1047 5     Pain Loc --      Pain Edu? --      Excl. in [place]? --    No data found.  Updated Vital Signs BP 108/79 (BP Location: Left Arm)   Pulse 71   Temp 98.4 F (36.9 C) (Oral)   Resp 16   Ht 5' 3.5" (1.613 m)   Wt 195 lb (88.5 kg)   SpO2 98%   BMI 34.00 kg/m   Visual Acuity Right Eye Distance:   Left  Eye Distance:   Bilateral Distance:    Right Eye Near:   Left Eye Near:    Bilateral Near:     Physical Exam  Constitutional: She appears well-developed and well-nourished. No distress.  HENT:  Head:  Normocephalic and atraumatic.  Right Ear: Tympanic membrane, external ear and ear canal normal.  Left Ear: Tympanic membrane, external ear and ear canal normal.  Nose: Mucosal edema and rhinorrhea present. No nose lacerations, sinus tenderness, nasal deformity, septal deviation or nasal septal hematoma. No epistaxis.  No foreign bodies. Right sinus exhibits no maxillary sinus tenderness and no frontal sinus tenderness. Left sinus exhibits no maxillary sinus tenderness and no frontal sinus tenderness.  Mouth/Throat: Uvula is midline, oropharynx is clear and moist and mucous membranes are normal. No oropharyngeal exudate.  Eyes: Conjunctivae are normal. Right eye exhibits no discharge. Left eye exhibits no discharge. No scleral icterus.  Neck: Normal range of motion. Neck supple. No thyromegaly present.  Cardiovascular: Normal rate, regular rhythm and normal heart sounds.  Pulmonary/Chest: Effort normal and breath sounds normal. No stridor. No respiratory distress. She has no wheezes. She has no rales.  Lymphadenopathy:    She has no cervical adenopathy.  Skin: She is not diaphoretic.  Nursing note and vitals reviewed.    UC Treatments / Results  Labs (all labs ordered are listed, but only abnormal results are displayed) Labs Reviewed - No data to display  EKG  EKG Interpretation None       Radiology No results found.  Procedures Procedures (including critical care time)  Medications Ordered in UC Medications - No data to display   Initial Impression / Assessment and Plan / UC Course  I have reviewed the triage vital signs and the nursing notes.  Pertinent labs & imaging results that were available during my care of the patient were reviewed by me and considered in my  medical decision making (see chart for details).       Final Clinical Impressions(s) / UC Diagnoses   Final diagnoses:  Viral URI with cough    ED Discharge Orders        Ordered    albuterol (PROVENTIL HFA;VENTOLIN HFA) 108 (90 Base) MCG/ACT inhaler  Every 6 hours PRN     06/30/17 1140    predniSONE (DELTASONE) 20 MG tablet  Daily     06/30/17 1140     1. diagnosis reviewed with patient 2. rx as per orders above; reviewed possible side effects, interactions, risks and benefits  3. Recommend supportive treatment with rest, fluids 4. Follow-up prn if symptoms worsen or don't improve  Controlled Substance Prescriptions [place] Controlled Substance Registry consulted? Not Applicable   Norval Gable, MD 06/30/17 1216

## 2017-08-22 ENCOUNTER — Other Ambulatory Visit: Payer: Self-pay | Admitting: Family Medicine

## 2017-11-26 DIAGNOSIS — L578 Other skin changes due to chronic exposure to nonionizing radiation: Secondary | ICD-10-CM | POA: Diagnosis not present

## 2017-11-26 DIAGNOSIS — Z872 Personal history of diseases of the skin and subcutaneous tissue: Secondary | ICD-10-CM | POA: Diagnosis not present

## 2017-11-26 DIAGNOSIS — L570 Actinic keratosis: Secondary | ICD-10-CM | POA: Diagnosis not present

## 2017-11-26 DIAGNOSIS — Z1283 Encounter for screening for malignant neoplasm of skin: Secondary | ICD-10-CM | POA: Diagnosis not present

## 2017-12-11 ENCOUNTER — Encounter: Payer: Self-pay | Admitting: Family Medicine

## 2017-12-11 ENCOUNTER — Ambulatory Visit (INDEPENDENT_AMBULATORY_CARE_PROVIDER_SITE_OTHER): Payer: PPO | Admitting: Family Medicine

## 2017-12-11 VITALS — BP 118/78 | HR 63 | Temp 98.4°F | Ht 63.5 in | Wt 198.2 lb

## 2017-12-11 DIAGNOSIS — M545 Low back pain, unspecified: Secondary | ICD-10-CM

## 2017-12-11 DIAGNOSIS — M436 Torticollis: Secondary | ICD-10-CM

## 2017-12-11 DIAGNOSIS — M79605 Pain in left leg: Secondary | ICD-10-CM

## 2017-12-11 MED ORDER — PREDNISONE 20 MG PO TABS: ORAL_TABLET | ORAL | 0 refills | Status: DC

## 2017-12-11 MED ORDER — CYCLOBENZAPRINE HCL 10 MG PO TABS: 10.0000 mg | ORAL_TABLET | Freq: Every evening | ORAL | 0 refills | Status: DC | PRN

## 2017-12-11 NOTE — Progress Notes (Signed)
Subjective:    Patient ID: Rebekah Johnston, female    DOB: Oct 20, 1950, 66 y.o.   MRN: [id number]  HPI   67 year old female presents with new onset left knee  pain X 1 month.  no Known injury, no change in activity but she has been dping a lot of work at her house.  She has also had some low back pain assocaited.. coincides with the knee pain, LEft buttock pain radiating to left knee. Occ pain in knee to foot and left foot tingling. No numbness, no weakness.  No knee redness or swelling.   No pain with knee flexion.  Occ feeling like she has a crick in her neck..  No radiation of pain into arms, no weakness and numbness in arms. Stiff in her neck. By end of the day occ if up on feet B knee slightly swollen.   Has not used anything for pain, no heat, ice.   Hx of right knee pain.  Xray 07/2016 IMPRESSION: Small suprapatellar joint effusion with mild induration of the infrapatellar fat pad possibly from the joint effusion. Minimal degenerative spurring is noted anteriorly and adjacent to the tibial spine. No acute appearing fracture, intra-articular loose body nor bone destruction is noted.  Cortsione shot helped.  Blood pressure 118/78, pulse 63, temperature 98.4 F (36.9 C), temperature source Oral, height 5' 3.5" (1.613 m), weight 198 lb 4 oz (89.9 kg).   Review of Systems  Constitutional: Negative for fatigue and fever.  HENT: Negative for congestion.   Eyes: Negative for pain.  Respiratory: Negative for cough and shortness of breath.   Cardiovascular: Negative for chest pain, palpitations and leg swelling.  Gastrointestinal: Negative for abdominal pain.  Genitourinary: Negative for dysuria and vaginal bleeding.  Musculoskeletal: Negative for back pain.  Neurological: Negative for syncope, light-headedness and headaches.  Psychiatric/Behavioral: Negative for dysphoric mood.       Objective:   Physical Exam  Constitutional: She is oriented to person, place, and time.  Vital signs are normal. She appears well-developed and well-nourished. She is cooperative.  Non-toxic appearance. She does not appear ill. No distress.  HENT:  Head: Normocephalic.  Right Ear: Hearing, tympanic membrane, external ear and ear canal normal. Tympanic membrane is not erythematous, not retracted and not bulging.  Left Ear: Hearing, tympanic membrane, external ear and ear canal normal. Tympanic membrane is not erythematous, not retracted and not bulging.  Nose: No mucosal edema or rhinorrhea. Right sinus exhibits no maxillary sinus tenderness and no frontal sinus tenderness. Left sinus exhibits no maxillary sinus tenderness and no frontal sinus tenderness.  Mouth/Throat: Uvula is midline, oropharynx is clear and moist and mucous membranes are normal.  Eyes: Pupils are equal, round, and reactive to light. Conjunctivae, EOM and lids are normal. Lids are everted and swept, no foreign bodies found.  Neck: Trachea normal and normal range of motion. Neck supple. Carotid bruit is not present. No thyroid mass and no thyromegaly present.  Cardiovascular: Normal rate, regular rhythm, S1 normal, S2 normal, normal heart sounds, intact distal pulses and normal pulses. Exam reveals no gallop and no friction rub.  No murmur heard. Pulmonary/Chest: Effort normal and breath sounds normal. No tachypnea. No respiratory distress. She has no decreased breath sounds. She has no wheezes. She has no rhonchi. She has no rales.  Abdominal: Soft. Normal appearance and bowel sounds are normal. There is no tenderness.  Musculoskeletal:       Cervical back: She exhibits decreased range of motion.  She exhibits no tenderness and no bony tenderness.       Lumbar back: She exhibits decreased range of motion and tenderness. She exhibits no bony tenderness.  Neg spurling Bilaterally.  Numbness with right SLR.  Neurological: She is alert and oriented to person, place, and time. She has normal strength and normal reflexes.  No cranial nerve deficit or sensory deficit. She exhibits normal muscle tone. She displays a negative Romberg sign. Coordination and gait normal. GCS eye subscore is 4. GCS verbal subscore is 5. GCS motor subscore is 6.  Nml cerebellar exam   No papilledema  Skin: Skin is warm, dry and intact. No rash noted.  Psychiatric: She has a normal mood and affect. Her speech is normal and behavior is normal. Judgment and thought content normal. Her mood appears not anxious. Cognition and memory are normal. Cognition and memory are not impaired. She does not exhibit a depressed mood. She exhibits normal recent memory and normal remote memory.          Assessment & Plan:

## 2017-12-11 NOTE — Patient Instructions (Signed)
Complete prednisone taper.  Use muscle relaxant at night is needed for muscle spasm. Start heat and home physical therapy.  Massage on neck.

## 2018-01-14 ENCOUNTER — Other Ambulatory Visit: Payer: Self-pay | Admitting: Family Medicine

## 2018-01-14 NOTE — Telephone Encounter (Signed)
Electronic refill request Last refill 05/27/17 #90/1 Last office visit 12/11/17, was to follow-up in 2 weeks No follow-up appointment scheduled

## 2018-01-24 DIAGNOSIS — E782 Mixed hyperlipidemia: Secondary | ICD-10-CM | POA: Diagnosis not present

## 2018-01-24 DIAGNOSIS — R7303 Prediabetes: Secondary | ICD-10-CM | POA: Diagnosis not present

## 2018-01-24 DIAGNOSIS — R0602 Shortness of breath: Secondary | ICD-10-CM | POA: Diagnosis not present

## 2018-01-24 DIAGNOSIS — R42 Dizziness and giddiness: Secondary | ICD-10-CM | POA: Diagnosis not present

## 2018-01-24 DIAGNOSIS — I340 Nonrheumatic mitral (valve) insufficiency: Secondary | ICD-10-CM | POA: Diagnosis not present

## 2018-03-12 DIAGNOSIS — M65351 Trigger finger, right little finger: Secondary | ICD-10-CM | POA: Diagnosis not present

## 2018-03-12 DIAGNOSIS — M65331 Trigger finger, right middle finger: Secondary | ICD-10-CM | POA: Diagnosis not present

## 2018-04-13 ENCOUNTER — Other Ambulatory Visit: Payer: Self-pay | Admitting: Family Medicine

## 2018-05-03 ENCOUNTER — Other Ambulatory Visit: Payer: Self-pay

## 2018-05-03 ENCOUNTER — Ambulatory Visit (INDEPENDENT_AMBULATORY_CARE_PROVIDER_SITE_OTHER): Payer: PPO

## 2018-05-03 ENCOUNTER — Ambulatory Visit
Admission: EM | Admit: 2018-05-03 | Discharge: 2018-05-03 | Disposition: A | Discharge status: Home / Self Care | Payer: PPO | Attending: Physician Assistant | Admitting: Physician Assistant

## 2018-05-03 DIAGNOSIS — K449 Diaphragmatic hernia without obstruction or gangrene: Secondary | ICD-10-CM

## 2018-05-03 DIAGNOSIS — N390 Urinary tract infection, site not specified: Secondary | ICD-10-CM

## 2018-05-03 DIAGNOSIS — R1031 Right lower quadrant pain: Secondary | ICD-10-CM

## 2018-05-03 DIAGNOSIS — R109 Unspecified abdominal pain: Secondary | ICD-10-CM | POA: Diagnosis not present

## 2018-05-03 LAB — CBC WITH DIFFERENTIAL/PLATELET
Abs Immature Granulocytes: 0.02 10*3/uL (ref 0.00–0.07)
Basophils Absolute: 0.1 10*3/uL (ref 0.0–0.1)
Basophils Relative: 1 %
Eosinophils Absolute: 0.1 10*3/uL (ref 0.0–0.5)
Eosinophils Relative: 1 %
HCT: 43.6 % (ref 36.0–46.0)
HEMOGLOBIN: 14.5 g/dL (ref 12.0–15.0)
Immature Granulocytes: 0 %
LYMPHS PCT: 28 %
Lymphs Abs: 2 10*3/uL (ref 0.7–4.0)
MCH: 28.5 pg (ref 26.0–34.0)
MCHC: 33.3 g/dL (ref 30.0–36.0)
MCV: 85.7 fL (ref 80.0–100.0)
MONOS PCT: 8 %
Monocytes Absolute: 0.6 10*3/uL (ref 0.1–1.0)
Neutro Abs: 4.4 10*3/uL (ref 1.7–7.7)
Neutrophils Relative %: 62 %
Platelets: 278 10*3/uL (ref 150–400)
RBC: 5.09 MIL/uL (ref 3.87–5.11)
RDW: 12.3 % (ref 11.5–15.5)
WBC: 7.2 10*3/uL (ref 4.0–10.5)
nRBC: 0 % (ref 0.0–0.2)

## 2018-05-03 LAB — URINALYSIS, COMPLETE (UACMP) WITH MICROSCOPIC
BILIRUBIN URINE: NEGATIVE
Glucose, UA: NEGATIVE mg/dL
HGB URINE DIPSTICK: NEGATIVE
Ketones, ur: NEGATIVE mg/dL
NITRITE: NEGATIVE
PROTEIN: NEGATIVE mg/dL
Specific Gravity, Urine: 1.025 (ref 1.005–1.030)
pH: 5.5 (ref 5.0–8.0)

## 2018-05-03 MED ORDER — SULFAMETHOXAZOLE-TRIMETHOPRIM 800-160 MG PO TABS: 1.0000 | ORAL_TABLET | Freq: Two times a day (BID) | ORAL | 0 refills | Status: AC

## 2018-05-03 NOTE — Discharge Instructions (Signed)
-  bactrim: one tablet twice a day for 7 days -Ibuprofen or Tylenol as needed for pain -Would return to the clinic or follow-up in the ER should you have worsening of your symptoms or develop fever, chills.

## 2018-05-03 NOTE — ED Provider Notes (Signed)
[facility]    CSN: [id number] Arrival date & time: 05/03/18  1208     History   Chief Complaint Chief Complaint  Patient presents with  . Flank Pain    HPI Rebekah Johnston is a 68 y.o. female.   Patient is a 68 year old female with past medical history of asthma, chronic kidney disease, depression, GERD, hyperlipidemia, and osteoarthritis who presents with complaint of right flank and lower back pain.  Patient states the pain started last night and has noticed that is worse when she is laying down than when she is up and moving.  Patient denies any urinary symptoms including painful urination, burning, frequency, hematuria.  Patient reports she still does have her appendix.  Patient denies history of kidney stones in the past.  She does report she has had UTIs in the past and that this pain is different.  Patient denies any fever.  She has taken no over-the-counter meds to treat this.  Patient reports the pain is achy and reports that about a 5 out of 10.  She reports that the pain is both to her lower back and the right flank at the same time but states since she has been here occasionally it feels like the pain is all around her lower body.  She states it does not seem like it started in her lower back and moved around to her flank.     Past Medical History:  Diagnosis Date  . Asthma    no inhalers  . Bronchitis    taking meds now  . Chronic kidney disease 2008   Stage G3b/A1  . Depression   . GERD (gastroesophageal reflux disease)   . Glaucoma    no eye drops  . Hyperlipidemia    no meds  . Macular degeneration    Bil  . Osteoarthritis     Patient Active Problem List   Diagnosis Date Noted  . High potassium 05/10/2017  . Vitamin D deficiency 05/10/2017  . Medicare annual wellness visit, subsequent 04/17/2017  . Trochanteric bursitis of left hip 03/09/2017  . Osteopenia 05/16/2016  . Major depressive disorder, recurrent episode, moderate ([place])  04/04/2016  . Gastroesophageal reflux disease 04/04/2016  . Low back pain 01/18/2016  . Right knee pain 01/18/2016  . Counseling regarding end of life decision making 03/18/2015  . URI (upper respiratory infection) 11/29/2010  . Chronic kidney disease (CKD) stage G3b/A1, moderately decreased glomerular filtration rate (GFR) between 30-44 mL/min/1.73 square meter and albuminuria creatinine ratio less than 30 mg/g (HCC) 08/20/2008  . HYPERCHOLESTEROLEMIA 05/20/2008  . ALLERGIC RHINITIS 04/16/2008  . EXERCISE INDUCED ASTHMA 04/16/2008  . OSTEOARTHRITIS 04/16/2008  . PREDIABETES 04/16/2008  . UMBILICAL HERNIORRHAPHY, HX OF 04/16/2008    Past Surgical History:  Procedure Laterality Date  . BUNIONECTOMY     rt. foot  . CHOLECYSTECTOMY    . HERNIA REPAIR     left inguinal hernia  . TONSILLECTOMY  1967    OB History   No obstetric history on file.      Home Medications    Prior to Admission medications   Medication Sig Start Date End Date Taking? Authorizing Provider  acetaminophen (TYLENOL) 325 MG tablet Take 650 mg by mouth every 6 (six) hours as needed.   Yes [provider]  Cholecalciferol (VITAMIN D3) 2000 units TABS Take by mouth daily.   Yes [provider]  citalopram (CELEXA) 40 MG tablet TAKE 1 TABLET (40 MG TOTAL) BY MOUTH DAILY. 04/15/18  Yes Bedsole, Amy E, MD  fluticasone (FLONASE) 50 MCG/ACT nasal spray Place 2 sprays into both nostrils as needed.    Yes [provider]  pantoprazole (PROTONIX) 40 MG tablet Take 40 mg by mouth daily. 02/16/15  Yes [provider]  sulfamethoxazole-trimethoprim (BACTRIM DS,SEPTRA DS) 800-160 MG tablet Take 1 tablet by mouth 2 (two) times daily for 7 days. 05/03/18 05/10/18  Luvenia Redden, PA-C  traMADol (ULTRAM) 50 MG tablet Take 1 tablet (50 mg total) by mouth every 8 (eight) hours as needed. 02/15/17   Coral Spikes, DO    Family History Family History  Problem Relation Age of Onset  .  Prostate cancer Father   . Dementia Mother   . Diabetes Brother   . Melanoma Other        Uncle  . Colon cancer Neg Hx   . Esophageal cancer Neg Hx   . Rectal cancer Neg Hx   . Stomach cancer Neg Hx     Social History Social History   Tobacco Use  . Smoking status: Former Smoker    Last attempt to quit: 05/23/2001    Years since quitting: 16.9  . Smokeless tobacco: Never Used  . Tobacco comment: smoked socially not everyday thing.  Substance Use Topics  . Alcohol use: Yes    Alcohol/week: 1.0 standard drinks    Types: 1 Glasses of wine per week    Comment: occasional once a month  . Drug use: No     Allergies   Patient has no known allergies.   Review of Systems Review of Systems   Physical Exam Triage Vital Signs ED Triage Vitals  Enc Vitals Group     BP 05/03/18 1241 126/62     Pulse Rate 05/03/18 1241 60     Resp 05/03/18 1241 18     Temp 05/03/18 1241 97.9 F (36.6 C)     Temp Source 05/03/18 1241 Oral     SpO2 05/03/18 1241 100 %     Weight 05/03/18 1238 200 lb (90.7 kg)     Height 05/03/18 1238 5\' 4"  (1.626 m)     Head Circumference --      Peak Flow --      Pain Score 05/03/18 1238 4     Pain Loc --      Pain Edu? --      Excl. in [place]? --    No data found.  Updated Vital Signs BP 126/62 (BP Location: Left Arm)   Pulse 60   Temp 97.9 F (36.6 C) (Oral)   Resp 18   Ht 5\' 4"  (1.626 m)   Wt 200 lb (90.7 kg)   SpO2 100%   BMI 34.33 kg/m   Physical Exam Constitutional:      Appearance: Normal appearance.  HENT:     Head: Normocephalic and atraumatic.  Neck:     Musculoskeletal: Normal range of motion and neck supple.  Cardiovascular:     Rate and Rhythm: Normal rate and regular rhythm.  Pulmonary:     Effort: Pulmonary effort is normal.     Breath sounds: Normal breath sounds.  Abdominal:     General: Bowel sounds are normal.     Palpations: Abdomen is soft. There is no mass.     Tenderness: There is right CVA tenderness. There is  no left CVA tenderness or guarding. Negative signs include Murphy's sign and McBurney's sign.  Neurological:     Mental Status: She is  alert.      UC Treatments / Results  Labs (all labs ordered are listed, but only abnormal results are displayed) Labs Reviewed  URINALYSIS, COMPLETE (UACMP) WITH MICROSCOPIC - Abnormal; Notable for the following components:      Result Value   Leukocytes, UA TRACE (*)    Bacteria, UA FEW (*)    All other components within normal limits  CBC WITH DIFFERENTIAL/PLATELET    EKG None  Radiology Dg Abd 2 Views  Result Date: 05/03/2018 CLINICAL DATA:  Right lower quadrant abdominal pain. EXAM: ABDOMEN - 2 VIEW COMPARISON:  CT 02/20/2012 FINDINGS: There is no bowel dilation to suggest obstruction. There are few nonspecific air-fluid levels noted on the erect view in the right abdomen. There is no free air. There changes from a prior cholecystectomy and left inguinal herniorrhaphy. Soft tissues are otherwise unremarkable. No acute skeletal abnormality. IMPRESSION: 1. No evidence of bowel obstruction or free air. 2. Few nonspecific air-fluid levels in the right abdomen, appear to be in small bowel. This is nonspecific but suggests a mild localized adynamic ileus. Recommend follow-up abdomen and pelvis CT with contrast if there are clinical findings suspicious for appendicitis. Electronically Signed   By: Lajean Manes M.D.   On: 05/03/2018 13:30   Ct Renal Stone Study  Result Date: 05/03/2018 CLINICAL DATA:  Right lower quadrant pain.  Possible UTI. EXAM: CT ABDOMEN AND PELVIS WITHOUT CONTRAST TECHNIQUE: Multidetector CT imaging of the abdomen and pelvis was performed following the standard protocol without IV contrast. COMPARISON:  02/20/2012 and 08/19/2011 FINDINGS: Lower chest: Lung bases are within normal.  Small hiatal hernia. Hepatobiliary: Previous cholecystectomy. Liver and biliary tree are normal. Pancreas: Mild fatty atrophy. Spleen: Normal.  Adrenals/Urinary Tract: Adrenal glands are normal. Kidneys are normal in size without hydronephrosis or nephrolithiasis. Ureters are normal. Bladder is unremarkable. Stomach/Bowel: Stomach and small bowel are normal. Appendix is normal. Colon is normal. Vascular/Lymphatic: Mild calcified plaque over the abdominal aorta. No adenopathy. Reproductive: Normal. Other: No free fluid or focal inflammatory change. Evidence of previous left inguinal hernia repair. Musculoskeletal: Mild degenerate change of the spine most prominent over the lower lumbar spine with disc disease at the L4-5 level. IMPRESSION: No acute findings in the abdomen/pelvis. Small sliding hiatal hernia. Aortic Atherosclerosis (ICD10-I70.0). Electronically Signed   By: Marin Olp M.D.   On: 05/03/2018 13:47    Procedures prescription for antibiotics for Procedures (including critical care time)  Medications Ordered in UC Medications - No data to display  Initial Impression / Assessment and Plan / UC Course  I have reviewed the triage vital signs and the nursing notes.  Pertinent labs & imaging results that were available during my care of the patient were reviewed by me and considered in my medical decision making (see chart for details).     Patient with right lower quadrant and flank pain, some mild costovertebral angle tenderness to palpation.  No reported tenderness palpation the left lower quadrant of the flank.  No rebound tenderness and no guarding.  No pain over McBurney's point.  Will check a urinalysis as well as a CBC.  Also check a abdominal x-ray.  Differential includes kidney stone, appendicitis, and UTI.  Urinalysis with trace leukocyte Estrace and 6-10 WBCs with mucus pleasant.  UA with no blood noted.  White blood count within normal limits.  Patient with complaint of abdominal pain achy had about a 5 however no pain with palpation to the lower right quadrant or at McBurney's point.  No rebound.  Some mild right CVA  tenderness.  Exam and labs equivocal.  Patient discussed with supervising physician.  Decision made to go ahead and obtain a CT scan.  KUB came back with air-fluid level on the right possibly small bowel ileus and recommended CT scan.  CT scan already been ordered at that point.  CT scan without evidence of renal stone, appendicitis, or small bowel abnormality.  Patient given prescription for possible UTI.  Advised to return to clinic or follow-up in ER should she have any worsening of her abdominal symptoms or develop fever and chills.  Patient verbalized understanding and agreement with plan.  Final Clinical Impressions(s) / UC Diagnoses   Final diagnoses:  Right flank pain     Discharge Instructions     -bactrim: one tablet twice a day for 7 days -Ibuprofen or Tylenol as needed for pain -Would return to the clinic or follow-up in the ER should you have worsening of your symptoms or develop fever, chills.    ED Prescriptions    Medication Sig Dispense Auth. Provider   sulfamethoxazole-trimethoprim (BACTRIM DS,SEPTRA DS) 800-160 MG tablet Take 1 tablet by mouth 2 (two) times daily for 7 days. 14 tablet Luvenia Redden, PA-C     Controlled Substance Prescriptions [place] Controlled Substance Registry consulted? Not Applicable   Luvenia Redden, PA-C 05/03/18 1610

## 2018-05-03 NOTE — ED Triage Notes (Signed)
Patient complains of right lower quadrant pain with right flank pain that started last night. Patient denies urinary symptoms.

## 2018-05-04 ENCOUNTER — Other Ambulatory Visit: Payer: Self-pay

## 2018-05-04 ENCOUNTER — Emergency Department
Admission: EM | Admit: 2018-05-04 | Discharge: 2018-05-04 | Disposition: A | Discharge status: Home / Self Care | Payer: PPO | Attending: Emergency Medicine | Admitting: Emergency Medicine

## 2018-05-04 DIAGNOSIS — N390 Urinary tract infection, site not specified: Secondary | ICD-10-CM | POA: Insufficient documentation

## 2018-05-04 DIAGNOSIS — J45909 Unspecified asthma, uncomplicated: Secondary | ICD-10-CM | POA: Diagnosis not present

## 2018-05-04 DIAGNOSIS — M5441 Lumbago with sciatica, right side: Secondary | ICD-10-CM | POA: Diagnosis not present

## 2018-05-04 DIAGNOSIS — F329 Major depressive disorder, single episode, unspecified: Secondary | ICD-10-CM | POA: Insufficient documentation

## 2018-05-04 DIAGNOSIS — Z9049 Acquired absence of other specified parts of digestive tract: Secondary | ICD-10-CM | POA: Diagnosis not present

## 2018-05-04 DIAGNOSIS — N183 Chronic kidney disease, stage 3 (moderate): Secondary | ICD-10-CM | POA: Insufficient documentation

## 2018-05-04 DIAGNOSIS — Z79899 Other long term (current) drug therapy: Secondary | ICD-10-CM | POA: Insufficient documentation

## 2018-05-04 DIAGNOSIS — M545 Low back pain: Secondary | ICD-10-CM | POA: Diagnosis present

## 2018-05-04 DIAGNOSIS — Z87891 Personal history of nicotine dependence: Secondary | ICD-10-CM | POA: Diagnosis not present

## 2018-05-04 DIAGNOSIS — M5431 Sciatica, right side: Secondary | ICD-10-CM

## 2018-05-04 LAB — URINALYSIS, COMPLETE (UACMP) WITH MICROSCOPIC
Bacteria, UA: NONE SEEN
Bilirubin Urine: NEGATIVE
Glucose, UA: NEGATIVE mg/dL
Hgb urine dipstick: NEGATIVE
Ketones, ur: NEGATIVE mg/dL
Nitrite: NEGATIVE
Protein, ur: NEGATIVE mg/dL
Specific Gravity, Urine: 1.019 (ref 1.005–1.030)
WBC, UA: >50 WBC/hpf — ABNORMAL HIGH (ref 0–5)
pH: 5 (ref 5.0–8.0)

## 2018-05-04 LAB — CBC
HCT: 42.9 % (ref 36.0–46.0)
Hemoglobin: 13.9 g/dL (ref 12.0–15.0)
MCH: 28.1 pg (ref 26.0–34.0)
MCHC: 32.4 g/dL (ref 30.0–36.0)
MCV: 86.8 fL (ref 80.0–100.0)
PLATELETS: 287 10*3/uL (ref 150–400)
RBC: 4.94 MIL/uL (ref 3.87–5.11)
RDW: 12.1 % (ref 11.5–15.5)
WBC: 8.6 10*3/uL (ref 4.0–10.5)
nRBC: 0 % (ref 0.0–0.2)

## 2018-05-04 LAB — COMPREHENSIVE METABOLIC PANEL
ALT: 16 U/L (ref 0–44)
ANION GAP: 6 (ref 5–15)
AST: 18 U/L (ref 15–41)
Albumin: 4.1 g/dL (ref 3.5–5.0)
Alkaline Phosphatase: 83 U/L (ref 38–126)
BUN: 16 mg/dL (ref 8–23)
CO2: 27 mmol/L (ref 22–32)
Calcium: 9.1 mg/dL (ref 8.9–10.3)
Chloride: 103 mmol/L (ref 98–111)
Creatinine, Ser: 1.42 mg/dL — ABNORMAL HIGH (ref 0.44–1.00)
GFR calc non Af Amer: 38 mL/min — ABNORMAL LOW (ref >60)
GFR, EST AFRICAN AMERICAN: 44 mL/min — AB (ref >60)
Glucose, Bld: 128 mg/dL — ABNORMAL HIGH (ref 70–99)
Potassium: 4.1 mmol/L (ref 3.5–5.1)
Sodium: 136 mmol/L (ref 135–145)
Total Bilirubin: 0.8 mg/dL (ref 0.3–1.2)
Total Protein: 7 g/dL (ref 6.5–8.1)

## 2018-05-04 LAB — LIPASE, BLOOD: Lipase: 29 U/L (ref 11–51)

## 2018-05-04 MED ORDER — PREDNISONE 20 MG PO TABS: 40.0000 mg | ORAL_TABLET | Freq: Every day | ORAL | 0 refills | Status: DC

## 2018-05-04 MED ORDER — PREDNISONE 20 MG PO TABS
60.0000 mg | ORAL_TABLET | Freq: Once | ORAL | Status: AC
  Administered 2018-05-04: 60 mg via ORAL
  Filled 2018-05-04: qty 3

## 2018-05-04 MED ORDER — MORPHINE SULFATE (PF) 4 MG/ML IV SOLN
4.0000 mg | Freq: Once | INTRAVENOUS | Status: AC
  Administered 2018-05-04: 4 mg via INTRAMUSCULAR
  Filled 2018-05-04: qty 1

## 2018-05-04 MED ORDER — OXYCODONE-ACETAMINOPHEN 5-325 MG PO TABS
2.0000 | ORAL_TABLET | Freq: Once | ORAL | Status: AC
  Administered 2018-05-04: 2 via ORAL
  Filled 2018-05-04: qty 2

## 2018-05-04 MED ORDER — OXYCODONE-ACETAMINOPHEN 5-325 MG PO TABS: 1.0000 | ORAL_TABLET | ORAL | 0 refills | Status: DC | PRN

## 2018-05-04 NOTE — ED Provider Notes (Signed)
[facility] Emergency Department Provider Note  Time seen: 7:55 AM  I have reviewed the triage vital signs and the nursing notes.   HISTORY  Chief Complaint Back Pain    HPI Rebekah Johnston is a 68 y.o. female with a past medical history of CKD gastric reflux, hyperlipidemia, low back pain, presents to the emergency department for low back pain with radiation down her right leg.  According to the patient since this past Thursday she has been experiencing low back pain with occasional right leg radiation.  Patient states a history of low back pain in the past.  A history of sciatica in the past which this feels identical.  Patient went to urgent care yesterday had an x-ray performed as well as a CT scan of the abdomen/pelvis.  I was able to review this with no acute findings on CT imaging.  Patient denies any dysuria or hematuria but does state some dark urine this morning.  Denies any fever, nausea vomiting or diarrhea.  Pain is somewhat worse with movement.  Describes the pain as moderate dull aching pain with occasional sharp pains down her right leg.   Past Medical History:  Diagnosis Date  . Asthma    no inhalers  . Bronchitis    taking meds now  . Chronic kidney disease 2008   Stage G3b/A1  . Depression   . GERD (gastroesophageal reflux disease)   . Glaucoma    no eye drops  . Hyperlipidemia    no meds  . Macular degeneration    Bil  . Osteoarthritis     Patient Active Problem List   Diagnosis Date Noted  . High potassium 05/10/2017  . Vitamin D deficiency 05/10/2017  . Medicare annual wellness visit, subsequent 04/17/2017  . Trochanteric bursitis of left hip 03/09/2017  . Osteopenia 05/16/2016  . Major depressive disorder, recurrent episode, moderate ([place]) 04/04/2016  . Gastroesophageal reflux disease 04/04/2016  . Low back pain 01/18/2016  . Right knee pain 01/18/2016  . Counseling regarding end of life decision making 03/18/2015  . URI  (upper respiratory infection) 11/29/2010  . Chronic kidney disease (CKD) stage G3b/A1, moderately decreased glomerular filtration rate (GFR) between 30-44 mL/min/1.73 square meter and albuminuria creatinine ratio less than 30 mg/g ([facility]CC) 08/20/2008  . HYPERCHOLESTEROLEMIA 05/20/2008  . ALLERGIC RHINITIS 04/16/2008  . EXERCISE INDUCED ASTHMA 04/16/2008  . OSTEOARTHRITIS 04/16/2008  . PREDIABETES 04/16/2008  . UMBILICAL HERNIORRHAPHY, HX OF 04/16/2008    Past Surgical History:  Procedure Laterality Date  . BUNIONECTOMY     rt. foot  . CHOLECYSTECTOMY    . HERNIA REPAIR     left inguinal hernia  . TONSILLECTOMY  1967    Prior to Admission medications   Medication Sig Start Date End Date Taking? Authorizing Provider  acetaminophen (TYLENOL) 325 MG tablet Take 650 mg by mouth every 6 (six) hours as needed.    [provider]  Cholecalciferol (VITAMIN D3) 2000 units TABS Take by mouth daily.    [provider]  citalopram (CELEXA) 40 MG tablet TAKE 1 TABLET (40 MG TOTAL) BY MOUTH DAILY. 04/15/18   Bedsole, Amy E, MD  fluticasone (FLONASE) 50 MCG/ACT nasal spray Place 2 sprays into both nostrils as needed.     [provider]  pantoprazole (PROTONIX) 40 MG tablet Take 40 mg by mouth daily. 02/16/15   [provider]  sulfamethoxazole-trimethoprim (BACTRIM DS,SEPTRA DS) 800-160 MG tablet Take 1 tablet by mouth 2 (two) times daily for  7 days. 05/03/18 05/10/18  Luvenia Redden, PA-C  traMADol (ULTRAM) 50 MG tablet Take 1 tablet (50 mg total) by mouth every 8 (eight) hours as needed. 02/15/17   Coral Spikes, DO    No Known Allergies  Family History  Problem Relation Age of Onset  . Prostate cancer Father   . Dementia Mother   . Diabetes Brother   . Melanoma Other        Uncle  . Colon cancer Neg Hx   . Esophageal cancer Neg Hx   . Rectal cancer Neg Hx   . Stomach cancer Neg Hx     Social History Social History   Tobacco Use  . Smoking status:  Former Smoker    Last attempt to quit: 05/23/2001    Years since quitting: 16.9  . Smokeless tobacco: Never Used  . Tobacco comment: smoked socially not everyday thing.  Substance Use Topics  . Alcohol use: Yes    Alcohol/week: 1.0 standard drinks    Types: 1 Glasses of wine per week    Comment: occasional once a month  . Drug use: No    Review of Systems Constitutional: Negative for fever. Cardiovascular: Negative for chest pain. Respiratory: Negative for shortness of breath. Gastrointestinal: Negative for abdominal pain, vomiting and diarrhea. Genitourinary: Darker urine. Musculoskeletal: Low back pain with right leg radiation Skin: Negative for skin complaints  Neurological: Negative for headache All other ROS negative  ____________________________________________   PHYSICAL EXAM:  VITAL SIGNS: ED Triage Vitals [05/04/18 0518]  Enc Vitals Group     BP (!) 123/106     Pulse Rate 71     Resp 20     Temp 98.1 F (36.7 C)     Temp Source Oral     SpO2 98 %     Weight 200 lb (90.7 kg)     Height 5\' 4"  (1.626 m)     Head Circumference      Peak Flow      Pain Score 9     Pain Loc      Pain Edu?      Excl. in [place]?    Constitutional: Alert and oriented. Well appearing and in no distress. Eyes: Normal exam ENT   Head: Normocephalic and atraumatic.   Mouth/Throat: Mucous membranes are moist. Cardiovascular: Normal rate, regular rhythm.  Respiratory: Normal respiratory effort without tachypnea nor retractions. Breath sounds are clear Gastrointestinal: Soft and nontender. No distention.   Musculoskeletal: No back tenderness.  No CVA tenderness.  No SI joint tenderness. Neurologic:  Normal speech and language. No gross focal neurologic deficits.  No numbness or paresthesias to lower extremities. Skin:  Skin is warm, dry and intact.  Psychiatric: Mood and affect are normal.     INITIAL IMPRESSION / ASSESSMENT AND PLAN / ED COURSE  Pertinent labs & imaging  results that were available during my care of the patient were reviewed by me and considered in my medical decision making (see chart for details).  Patient presents to the emergency department for lower back pain with right leg radiation ongoing for the past 2 days.  Patient's lab work has resulted showing chronic kidney disease, urinalysis consistent with urinary tract infection.  Urine culture will be added onto the patient's urinalysis.  However patient's description of her discomfort is much more consistent with sciatica.  No sensory deficits.  Moderate lower back pain with occasional right leg radiation.  States a history of low back pain and sciatica  in the past.  Patient was diagnosed with a urinary tract infection by urgent care yesterday and started on Septra, which is a reasonable antibiotic choice.  In addition to the Septra we will add Percocet for pain control and steroids in an attempt to reduce nerve root inflammation.  Patient will follow-up with her doctor.  Provided return precautions.  ____________________________________________   FINAL CLINICAL IMPRESSION(S) / ED DIAGNOSES  Sciatica Urinary tract infection   Harvest Dark, MD 05/04/18 [phone]

## 2018-05-04 NOTE — Discharge Instructions (Addendum)
Please follow-up with your doctor for recheck/reevaluation.  Return to the emergency department for any worsening pain, any numbness of the legs, any urinary incontinence or development of fever.

## 2018-05-04 NOTE — ED Notes (Signed)
Waiting for ride 

## 2018-05-04 NOTE — ED Triage Notes (Signed)
Pt in with co lower back and lower abd pain since Thursday, went to urgent care and had xray and ct of abd done. States all tests were neg, here for persistent pain. Pain now radiates to right leg.

## 2018-05-05 LAB — URINE CULTURE
Culture: NO GROWTH
Special Requests: NORMAL

## 2018-05-07 ENCOUNTER — Telehealth (HOSPITAL_COMMUNITY): Payer: Self-pay | Admitting: Emergency Medicine

## 2018-05-07 NOTE — Telephone Encounter (Signed)
Attempted to reach patient. No answer at this time.   

## 2018-05-21 ENCOUNTER — Other Ambulatory Visit (INDEPENDENT_AMBULATORY_CARE_PROVIDER_SITE_OTHER): Payer: PPO

## 2018-05-21 ENCOUNTER — Telehealth: Payer: Self-pay | Admitting: Family Medicine

## 2018-05-21 ENCOUNTER — Ambulatory Visit: Payer: PPO

## 2018-05-21 DIAGNOSIS — E559 Vitamin D deficiency, unspecified: Secondary | ICD-10-CM

## 2018-05-21 DIAGNOSIS — E7800 Pure hypercholesterolemia, unspecified: Secondary | ICD-10-CM | POA: Diagnosis not present

## 2018-05-21 DIAGNOSIS — R7309 Other abnormal glucose: Secondary | ICD-10-CM

## 2018-05-21 DIAGNOSIS — M8580 Other specified disorders of bone density and structure, unspecified site: Secondary | ICD-10-CM

## 2018-05-21 LAB — COMPREHENSIVE METABOLIC PANEL
ALT: 13 U/L (ref 0–35)
AST: 14 U/L (ref 0–37)
Albumin: 3.8 g/dL (ref 3.5–5.2)
Alkaline Phosphatase: 76 U/L (ref 39–117)
BUN: 15 mg/dL (ref 6–23)
CALCIUM: 9.8 mg/dL (ref 8.4–10.5)
CO2: 31 mEq/L (ref 19–32)
Chloride: 103 mEq/L (ref 96–112)
Creatinine, Ser: 1.21 mg/dL — ABNORMAL HIGH (ref 0.40–1.20)
GFR: 44.17 mL/min — ABNORMAL LOW (ref >60.00)
Glucose, Bld: 134 mg/dL — ABNORMAL HIGH (ref 70–99)
Potassium: 4.6 mEq/L (ref 3.5–5.1)
Sodium: 139 mEq/L (ref 135–145)
TOTAL PROTEIN: 6.6 g/dL (ref 6.0–8.3)
Total Bilirubin: 0.8 mg/dL (ref 0.2–1.2)

## 2018-05-21 LAB — LIPID PANEL
CHOLESTEROL: 255 mg/dL — AB (ref 0–200)
HDL: 49.5 mg/dL (ref >39.00)
LDL Cholesterol: 168 mg/dL — ABNORMAL HIGH (ref 0–99)
NonHDL: 205.27
Total CHOL/HDL Ratio: 5
Triglycerides: 188 mg/dL — ABNORMAL HIGH (ref 0.0–149.0)
VLDL: 37.6 mg/dL (ref 0.0–40.0)

## 2018-05-21 LAB — VITAMIN D 25 HYDROXY (VIT D DEFICIENCY, FRACTURES): VITD: 25.76 ng/mL — ABNORMAL LOW (ref 30.00–100.00)

## 2018-05-21 LAB — HEMOGLOBIN A1C: Hgb A1c MFr Bld: 6.3 % (ref 4.6–6.5)

## 2018-05-21 NOTE — Telephone Encounter (Signed)
-----   Message from Eustace Pen, LPN sent at 9/49/9718  3:53 PM EST ----- Regarding: Labs 1/21 Lab orders needed. Thank you.

## 2018-05-23 ENCOUNTER — Ambulatory Visit (INDEPENDENT_AMBULATORY_CARE_PROVIDER_SITE_OTHER): Payer: PPO | Admitting: Family Medicine

## 2018-05-23 ENCOUNTER — Ambulatory Visit (INDEPENDENT_AMBULATORY_CARE_PROVIDER_SITE_OTHER)
Admission: RE | Admit: 2018-05-23 | Discharge: 2018-05-23 | Disposition: A | Discharge status: Home / Self Care | Payer: PPO | Source: Ambulatory Visit | Attending: Family Medicine | Admitting: Family Medicine

## 2018-05-23 ENCOUNTER — Encounter: Payer: Self-pay | Admitting: Family Medicine

## 2018-05-23 ENCOUNTER — Other Ambulatory Visit: Payer: Self-pay | Admitting: Family Medicine

## 2018-05-23 VITALS — BP 120/78 | HR 61 | Temp 97.8°F | Ht 63.5 in | Wt 200.5 lb

## 2018-05-23 DIAGNOSIS — E559 Vitamin D deficiency, unspecified: Secondary | ICD-10-CM

## 2018-05-23 DIAGNOSIS — E7800 Pure hypercholesterolemia, unspecified: Secondary | ICD-10-CM | POA: Diagnosis not present

## 2018-05-23 DIAGNOSIS — M25562 Pain in left knee: Secondary | ICD-10-CM

## 2018-05-23 DIAGNOSIS — Z0000 Encounter for general adult medical examination without abnormal findings: Secondary | ICD-10-CM | POA: Diagnosis not present

## 2018-05-23 DIAGNOSIS — N1832 Chronic kidney disease, stage 3b: Secondary | ICD-10-CM

## 2018-05-23 DIAGNOSIS — R7309 Other abnormal glucose: Secondary | ICD-10-CM

## 2018-05-23 DIAGNOSIS — M1711 Unilateral primary osteoarthritis, right knee: Secondary | ICD-10-CM | POA: Diagnosis not present

## 2018-05-23 DIAGNOSIS — G8929 Other chronic pain: Secondary | ICD-10-CM

## 2018-05-23 DIAGNOSIS — M25561 Pain in right knee: Secondary | ICD-10-CM

## 2018-05-23 DIAGNOSIS — K219 Gastro-esophageal reflux disease without esophagitis: Secondary | ICD-10-CM

## 2018-05-23 DIAGNOSIS — F331 Major depressive disorder, recurrent, moderate: Secondary | ICD-10-CM | POA: Diagnosis not present

## 2018-05-23 DIAGNOSIS — N183 Chronic kidney disease, stage 3 (moderate): Secondary | ICD-10-CM

## 2018-05-23 DIAGNOSIS — M1712 Unilateral primary osteoarthritis, left knee: Secondary | ICD-10-CM | POA: Diagnosis not present

## 2018-05-23 MED ORDER — CITALOPRAM HYDROBROMIDE 40 MG PO TABS: 40.0000 mg | ORAL_TABLET | Freq: Every day | ORAL | 1 refills | Status: DC

## 2018-05-23 NOTE — Assessment & Plan Note (Signed)
Poor control... get back on track with lifestyle.Marland Kitchen re-eval in 3 months. If not at goal retry different statin.

## 2018-05-23 NOTE — Assessment & Plan Note (Addendum)
Pt hesitant to make new dx DM. Work aggressive on low carb diet, weight loss and exercise. Re-eval in 3 months.

## 2018-05-23 NOTE — Progress Notes (Signed)
Subjective:    Patient ID: Rebekah Johnston, female    DOB: Aug 07, 1950, 68 y.o.   MRN: [id number]  HPI The patient presents for annual medicare wellness, complete physical and review of chronic health problems. He/She also has the following acute concerns today: Bilateral knee pain, both hurt daily.  Occ swelling  but no redness. Bilateral lateral joint line pain. No known injury or fall.  Has not treated with any meds. Reviewed 2017 X-ray: mild spurring.  Steroid injection many years ago.  Elevated Cholesterol:  Worsened control off statin.. SE in past to low dose  Lab Results  Component Value Date   CHOL 255 (H) 05/21/2018   HDL 49.50 05/21/2018   LDLCALC 168 (H) 05/21/2018   LDLDIRECT 179.3 06/20/2013   TRIG 188.0 (H) 05/21/2018   CHOLHDL 5 05/21/2018  Using medications without problems: Muscle aches:  Diet compliance: soda, high carb Exercise: none Other complaints:  GERD: on pantoprazole  Prediabetes/ borderline diabetes. Lab Results  Component Value Date   HGBA1C 6.3 05/21/2018   CKD stage 3, stable  Vit D def: low despite 1000 mg  Every other day vit D.  MMD, stable good control on celexa.    Advance directives and end of life planning reviewed in detail with patient and documented in EMR. Patient given handout on advance care directives if needed. HCPOA and living will updated if needed.  Hearing Screening Comments: Complete Hearing Test done with [facility] audiologist in June 2019 Vision Screening Comments: Eye Exam with Dr. Cephus Shelling at [facility] in [place] 06/2017     Office Visit from 05/23/2018 in [place] at [facility] Total Score  0     Fall Risk  05/23/2018 04/17/2017 03/30/2016 03/18/2015  Falls in the past year? 0 Yes No No  Number falls in past yr: - 1 - -  Injury with Fall? - Yes - -  Follow up - Falls prevention discussed - -    Social History /Family History/Past Medical History reviewed in detail and updated in EMR if  needed. Blood pressure 120/78, pulse 61, temperature 97.8 F (36.6 C), temperature source Oral, height 5' 3.5" (1.613 m), weight 200 lb 8 oz (90.9 kg), SpO2 97 %.   Review of Systems  Constitutional: Negative for fatigue and fever.  HENT: Negative for congestion.   Eyes: Negative for pain.  Respiratory: Negative for cough and shortness of breath.   Cardiovascular: Negative for chest pain, palpitations and leg swelling.  Gastrointestinal: Negative for abdominal pain.  Genitourinary: Negative for dysuria and vaginal bleeding.  Musculoskeletal: Negative for back pain.  Neurological: Negative for syncope, light-headedness and headaches.  Psychiatric/Behavioral: Negative for dysphoric mood.       Objective:   Physical Exam Constitutional:      General: She is not in acute distress.    Appearance: Normal appearance. She is well-developed. She is not ill-appearing or toxic-appearing.  HENT:     Head: Normocephalic.     Right Ear: Hearing, tympanic membrane, ear canal and external ear normal.     Left Ear: Hearing, tympanic membrane, ear canal and external ear normal.     Nose: Nose normal.  Eyes:     General: Lids are normal. Lids are everted, no foreign bodies appreciated.     Conjunctiva/sclera: Conjunctivae normal.     Pupils: Pupils are equal, round, and reactive to light.  Neck:     Musculoskeletal: Normal range of motion and neck supple.     Thyroid:  No thyroid mass or thyromegaly.     Vascular: No carotid bruit.     Trachea: Trachea normal.  Cardiovascular:     Rate and Rhythm: Normal rate and regular rhythm.     Heart sounds: Normal heart sounds, S1 normal and S2 normal. No murmur. No gallop.   Pulmonary:     Effort: Pulmonary effort is normal. No respiratory distress.     Breath sounds: Normal breath sounds. No wheezing, rhonchi or rales.  Abdominal:     General: Bowel sounds are normal. There is no distension or abdominal bruit.     Palpations: Abdomen is soft. There  is no fluid wave or mass.     Tenderness: There is no abdominal tenderness. There is no guarding or rebound.     Hernia: No hernia is present.  Genitourinary:    Exam position: Supine.     Labia:        Right: No rash, tenderness or lesion.        Left: No rash, tenderness or lesion.      Vagina: Normal.     Cervix: No cervical motion tenderness, discharge or friability.     Uterus: Not enlarged and not tender.      Adnexa:        Right: No mass, tenderness or fullness.         Left: No mass, tenderness or fullness.    Musculoskeletal:     Right knee: She exhibits decreased range of motion. She exhibits no bony tenderness and normal meniscus. Tenderness found. No medial joint line, no lateral joint line, no MCL, no LCL and no patellar tendon tenderness noted.     Left knee: She exhibits decreased range of motion. She exhibits no bony tenderness and normal meniscus. Tenderness found. No medial joint line, no lateral joint line, no MCL, no LCL and no patellar tendon tenderness noted.     Comments:  Crepitus likely patellofemoral  Lymphadenopathy:     Cervical: No cervical adenopathy.  Skin:    General: Skin is warm and dry.     Findings: No rash.  Neurological:     Mental Status: She is alert.     Cranial Nerves: No cranial nerve deficit.     Sensory: No sensory deficit.  Psychiatric:        Mood and Affect: Mood is not anxious or depressed.        Speech: Speech normal.        Behavior: Behavior normal. Behavior is cooperative.        Judgment: Judgment normal.           Assessment & Plan:  The patient's preventative maintenance and recommended screening tests for an annual wellness exam were reviewed in full today. Brought up to date unless services declined.  Counselled on the importance of diet, exercise, and its role in overall health and mortality. The patient's FH and SH was reviewed, including their home life, tobacco status, and drug and alcohol status.    Vaccines:uptodate, can consider shingles Pap/DVE:nml pap, neg HPV 2015, no further indicated, Continue DVE 1-2 years. Mammo1/15/2018.Marland Kitchen Plan q 2 years Bone Density osteopenia 05/2016, repeat in 5 years Colon:06/2016, Dr. Fuller Plan... Repeat in 5 years. Smoking Status:nonsmoker Hep C:done

## 2018-05-23 NOTE — Assessment & Plan Note (Addendum)
Eval with knee  X-ray. OA versus patellofemoral syndrome.  Start glucosamine 500 mg 1-3 times daily.

## 2018-05-23 NOTE — Assessment & Plan Note (Signed)
Replete

## 2018-05-23 NOTE — Patient Instructions (Addendum)
Increase OTC vit D 3 to daily.  Work aggressive on low carb low cholesterol diet, weight loss and exercise  Stop  PEPSI.  Get back to regular exercise.Marland Kitchen 3-5 days a week. Return in 3 months for labs only visit. Call to schedule mammogram on your own.  Start glucosamine 500 mg 1-3 times daily.  We will call with X-ray results.

## 2018-05-23 NOTE — Assessment & Plan Note (Signed)
Stable Need better sugar control

## 2018-05-23 NOTE — Assessment & Plan Note (Signed)
Stable control. 

## 2018-05-23 NOTE — Assessment & Plan Note (Signed)
Stable control on celexa. 

## 2018-06-20 ENCOUNTER — Other Ambulatory Visit: Payer: Self-pay

## 2018-06-20 ENCOUNTER — Ambulatory Visit
Admission: EM | Admit: 2018-06-20 | Discharge: 2018-06-20 | Disposition: A | Discharge status: Home / Self Care | Payer: PPO | Attending: Emergency Medicine | Admitting: Emergency Medicine

## 2018-06-20 DIAGNOSIS — J069 Acute upper respiratory infection, unspecified: Secondary | ICD-10-CM

## 2018-06-20 DIAGNOSIS — B9789 Other viral agents as the cause of diseases classified elsewhere: Secondary | ICD-10-CM | POA: Diagnosis not present

## 2018-06-20 DIAGNOSIS — J4521 Mild intermittent asthma with (acute) exacerbation: Secondary | ICD-10-CM | POA: Diagnosis not present

## 2018-06-20 MED ORDER — BENZONATATE 200 MG PO CAPS: 200.0000 mg | ORAL_CAPSULE | Freq: Three times a day (TID) | ORAL | 0 refills | Status: DC | PRN

## 2018-06-20 MED ORDER — AEROCHAMBER PLUS MISC: 2 refills | Status: DC

## 2018-06-20 MED ORDER — ALBUTEROL SULFATE HFA 108 (90 BASE) MCG/ACT IN AERS: 1.0000 | INHALATION_SPRAY | Freq: Four times a day (QID) | RESPIRATORY_TRACT | 0 refills | Status: DC | PRN

## 2018-06-20 NOTE — ED Provider Notes (Signed)
HPI  SUBJECTIVE:  Rebekah Johnston is a 68 y.o. female who presents with 5 days of URI symptoms with a cough productive of white-greenish phlegm, sore throat secondary to cough, chest tightness and soreness secondary to cough.  She reports wheezing starting last night.  She reports shortness of breath, dyspnea on exertion and intermittent headaches.  She has had several grandchildren with URI.  She is sleeping okay night-not coughing all night long.  No fevers, nasal congestion, rhinorrhea, postnasal drip.  No contacts with flu.  Denies body aches.  She tried rest with some improvement of symptoms.  Symptoms are worse with coughing.  No double sickening.  No antibiotics in the past month.  No antipyretic in the past 4 to 6 hours.  Past medical history of GERD, asthma, chronic kidney disease, she is a former smoker.  She is a prediabetic.  No history of hypertension.  DZH:GDJMEQA, Amy E, MD   Past Medical History:  Diagnosis Date  . Asthma    no inhalers  . Bronchitis    taking meds now  . Chronic kidney disease 2008   Stage G3b/A1  . Depression   . GERD (gastroesophageal reflux disease)   . Glaucoma    no eye drops  . Hyperlipidemia    no meds  . Macular degeneration    Bil  . Osteoarthritis     Past Surgical History:  Procedure Laterality Date  . BUNIONECTOMY     rt. foot  . CHOLECYSTECTOMY    . HERNIA REPAIR     left inguinal hernia  . TONSILLECTOMY  1967    Family History  Problem Relation Age of Onset  . Prostate cancer Father   . Dementia Mother   . Diabetes Brother   . Melanoma Other        Uncle  . Colon cancer Neg Hx   . Esophageal cancer Neg Hx   . Rectal cancer Neg Hx   . Stomach cancer Neg Hx     Social History   Tobacco Use  . Smoking status: Former Smoker    Last attempt to quit: 05/23/2001    Years since quitting: 17.0  . Smokeless tobacco: Never Used  . Tobacco comment: smoked socially not everyday thing.  Substance Use Topics  . Alcohol use:  Yes    Alcohol/week: 1.0 standard drinks    Types: 1 Glasses of wine per week    Comment: occasional once a month  . Drug use: No    No current facility-administered medications for this encounter.   Current Outpatient Medications:  .  acetaminophen (TYLENOL) 325 MG tablet, Take 650 mg by mouth every 6 (six) hours as needed., Disp: , Rfl:  .  Cholecalciferol (VITAMIN D3) 2000 units TABS, Take by mouth daily., Disp: , Rfl:  .  citalopram (CELEXA) 40 MG tablet, Take 1 tablet (40 mg total) by mouth daily., Disp: 90 tablet, Rfl: 1 .  fluticasone (FLONASE) 50 MCG/ACT nasal spray, Place 2 sprays into both nostrils as needed. , Disp: , Rfl:  .  pantoprazole (PROTONIX) 40 MG tablet, Take 40 mg by mouth daily., Disp: , Rfl: 11 .  albuterol (PROVENTIL HFA;VENTOLIN HFA) 108 (90 Base) MCG/ACT inhaler, Inhale 1-2 puffs into the lungs every 6 (six) hours as needed for wheezing or shortness of breath., Disp: 1 Inhaler, Rfl: 0 .  benzonatate (TESSALON) 200 MG capsule, Take 1 capsule (200 mg total) by mouth 3 (three) times daily as needed for cough., Disp: 30 capsule, Rfl:  0 .  Spacer/Aero-Holding Chambers (AEROCHAMBER PLUS) inhaler, Use as instructed, Disp: 1 each, Rfl: 2  No Known Allergies   ROS  As noted in HPI.   Physical Exam  BP 136/85 (BP Location: Right Arm)   Pulse 65   Temp 98.6 F (37 C) (Oral)   Resp 18   Ht 5\' 4"  (1.626 m)   Wt 90.7 kg   SpO2 98%   BMI 34.33 kg/m   Constitutional: Well developed, well nourished, no acute distress Eyes:  EOMI, conjunctiva normal bilaterally HENT: Normocephalic, atraumatic,mucus membranes moist.  No nasal congestion.  Normal turbinates.  No maxillary, frontal sinus tenderness.  Tonsils surgically absent.  Normal oropharynx. Respiratory: Normal inspiratory effort, lungs clear bilaterally.  Good air movement.  No wheezing, rales, rhonchi.  Positive mild lateral chest wall tenderness Cardiovascular: Normal rate, regular rhythm, no murmurs rubs or  gallops GI: nondistended skin: No rash, skin intact Musculoskeletal: no deformities Neurologic: Alert & oriented x 3, no focal neuro deficits Psychiatric: Speech and behavior appropriate   ED Course   Medications - No data to display  No orders of the defined types were placed in this encounter.   No results found for this or any previous visit (from the past 24 hour(s)). No results found.  ED Clinical Impression  Viral URI with cough  Mild intermittent asthma with acute exacerbation   ED Assessment/Plan   His lungs are clear today, good air movement.  She does have some lateral chest wall tenderness.  She has no evidence of sinusitis, pharyngitis or pneumonia today.  presentation consistent with a viral URI with cough, suspect that her asthma is being triggered off by the URI.  Will send home with scheduled albuterol with a spacer for the next 4 days, Tessalon.  Holding off on antibiotics today.  Will return here or see her doctor if not better in 5 days and we can reconsider antibiotics.  Discussed MDM, treatment plan, and plan for follow-up with patient. Discussed sn/sx that should prompt return to the ED. patient agrees with plan.   Meds ordered this encounter  Medications  . benzonatate (TESSALON) 200 MG capsule    Sig: Take 1 capsule (200 mg total) by mouth 3 (three) times daily as needed for cough.    Dispense:  30 capsule    Refill:  0  . Spacer/Aero-Holding Chambers (AEROCHAMBER PLUS) inhaler    Sig: Use as instructed    Dispense:  1 each    Refill:  2  . albuterol (PROVENTIL HFA;VENTOLIN HFA) 108 (90 Base) MCG/ACT inhaler    Sig: Inhale 1-2 puffs into the lungs every 6 (six) hours as needed for wheezing or shortness of breath.    Dispense:  1 Inhaler    Refill:  0    *This clinic note was created using [employer]. Therefore, there may be occasional mistakes despite careful proofreading.   ?    Melynda Ripple, MD 06/20/18 1534

## 2018-06-20 NOTE — Discharge Instructions (Signed)
Puffs from your albuterol inhaler every 4 hours for the next 2 days, then back off to every 6 hours for the next 2 days.  Then you may use it as needed.  Make sure you use the spacer.  It will make the albuterol more effective.  Tessalon as needed for cough.  Continue Tylenol, pushing fluids.

## 2018-06-20 NOTE — ED Triage Notes (Signed)
Patient complains of cough, body aches x Sunday.

## 2018-07-09 ENCOUNTER — Ambulatory Visit (INDEPENDENT_AMBULATORY_CARE_PROVIDER_SITE_OTHER): Payer: PPO | Admitting: Family Medicine

## 2018-07-09 ENCOUNTER — Encounter: Payer: Self-pay | Admitting: Family Medicine

## 2018-07-09 DIAGNOSIS — R05 Cough: Secondary | ICD-10-CM | POA: Diagnosis not present

## 2018-07-09 DIAGNOSIS — H6982 Other specified disorders of Eustachian tube, left ear: Secondary | ICD-10-CM

## 2018-07-09 DIAGNOSIS — H6992 Unspecified Eustachian tube disorder, left ear: Secondary | ICD-10-CM | POA: Insufficient documentation

## 2018-07-09 DIAGNOSIS — R059 Cough, unspecified: Secondary | ICD-10-CM | POA: Insufficient documentation

## 2018-07-09 MED ORDER — PREDNISONE 20 MG PO TABS: ORAL_TABLET | ORAL | 0 refills | Status: DC

## 2018-07-09 NOTE — Patient Instructions (Signed)
Compelte prednisone taper.  Ue albuterol as needed.  Start zyrtec at bedtime .  Call if new fever, worsening ear pain or sinus pain or worsening shortness of breath.

## 2018-07-09 NOTE — Progress Notes (Signed)
Subjective:    Patient ID: Rebekah Johnston, female    DOB: 01/07/1951, 68 y.o.   MRN: [id number]  Cough  This is a new problem. Episode onset:  2/16 seen at urgent care.Marland Kitchen dx with viral infeciton.Marland Kitchen given albuterol and tessalon perles... got some better but never stopped coughing. The problem has been rapidly worsening. The cough is productive of sputum. Associated symptoms include ear pain (left ear ), headaches, postnasal drip, shortness of breath and wheezing. Pertinent negatives include no chills, fever, myalgias, nasal congestion or sore throat. Associated symptoms comments: Post tussive  no sneeze, no itchy eyes  SOB only with coughing fits.. The symptoms are aggravated by lying down. Risk factors: foremer smoker. She has tried OTC cough suppressant and a beta-agonist inhaler for the symptoms. The treatment provided mild relief. Her past medical history is significant for asthma and environmental allergies. There is no history of COPD.    Not currently on flonase.  Social History /Family History/Past Medical History reviewed in detail and updated in EMR if needed. Blood pressure 110/80, pulse 74, temperature 98.1 F (36.7 C), temperature source Oral, resp. rate 16, height 5' 3.5" (1.613 m), weight 199 lb (90.3 kg), SpO2 96 %.  Review of Systems  Constitutional: Negative for chills and fever.  HENT: Positive for ear pain (left ear ) and postnasal drip. Negative for sore throat.   Respiratory: Positive for cough, shortness of breath and wheezing.   Musculoskeletal: Negative for myalgias.  Allergic/Immunologic: Positive for environmental allergies.  Neurological: Positive for headaches.       Objective:   Physical Exam Constitutional:      General: She is not in acute distress.    Appearance: She is well-developed. She is not ill-appearing or toxic-appearing.  HENT:     Head: Normocephalic.     Right Ear: Hearing, tympanic membrane, ear canal and external ear normal. No middle  ear effusion. Tympanic membrane is not erythematous, retracted or bulging.     Left Ear: Hearing, ear canal and external ear normal. A middle ear effusion is present. Tympanic membrane is not erythematous, retracted or bulging.     Nose: Mucosal edema and rhinorrhea present.     Right Sinus: No maxillary sinus tenderness or frontal sinus tenderness.     Left Sinus: No maxillary sinus tenderness or frontal sinus tenderness.     Mouth/Throat:     Pharynx: Uvula midline.     Comments: Post nasal drip Eyes:     General: Lids are normal. Lids are everted, no foreign bodies appreciated.     Conjunctiva/sclera: Conjunctivae normal.     Pupils: Pupils are equal, round, and reactive to light.  Neck:     Musculoskeletal: Normal range of motion and neck supple.     Thyroid: No thyroid mass or thyromegaly.     Vascular: No carotid bruit.     Trachea: Trachea normal.  Cardiovascular:     Rate and Rhythm: Normal rate and regular rhythm.     Pulses: Normal pulses.     Heart sounds: Normal heart sounds, S1 normal and S2 normal. No murmur. No friction rub. No gallop.   Pulmonary:     Effort: Pulmonary effort is normal. No tachypnea or respiratory distress.     Breath sounds: Normal breath sounds. No decreased breath sounds, wheezing, rhonchi or rales.  Skin:    General: Skin is warm and dry.     Findings: No rash.  Neurological:     Mental Status: She  is alert.  Psychiatric:        Mood and Affect: Mood is not anxious or depressed.        Speech: Speech normal.        Behavior: Behavior normal. Behavior is cooperative.        Judgment: Judgment normal.           Assessment & Plan:

## 2018-07-09 NOTE — Assessment & Plan Note (Signed)
No wheeze but likely cough variant asthma fare.. treat with pred taper ( this will also help with ETD. Albuterol prn and start zyrtec as allergies likley part of trigger. NO clear sign of active infection.

## 2018-08-20 ENCOUNTER — Telehealth: Payer: Self-pay

## 2018-08-20 NOTE — Telephone Encounter (Signed)
LVM to call clinic for appt info.  Needs COVID screen and curbside info for lab appt

## 2018-08-22 ENCOUNTER — Other Ambulatory Visit: Payer: Self-pay

## 2018-08-22 ENCOUNTER — Encounter: Payer: Self-pay | Admitting: *Deleted

## 2018-08-22 ENCOUNTER — Other Ambulatory Visit (INDEPENDENT_AMBULATORY_CARE_PROVIDER_SITE_OTHER): Payer: PPO

## 2018-08-22 ENCOUNTER — Telehealth: Payer: Self-pay | Admitting: Family Medicine

## 2018-08-22 DIAGNOSIS — E7800 Pure hypercholesterolemia, unspecified: Secondary | ICD-10-CM

## 2018-08-22 DIAGNOSIS — E559 Vitamin D deficiency, unspecified: Secondary | ICD-10-CM

## 2018-08-22 DIAGNOSIS — R7309 Other abnormal glucose: Secondary | ICD-10-CM

## 2018-08-22 DIAGNOSIS — M8580 Other specified disorders of bone density and structure, unspecified site: Secondary | ICD-10-CM

## 2018-08-22 LAB — COMPREHENSIVE METABOLIC PANEL
ALT: 12 U/L (ref 0–35)
AST: 14 U/L (ref 0–37)
Albumin: 3.7 g/dL (ref 3.5–5.2)
Alkaline Phosphatase: 77 U/L (ref 39–117)
BUN: 13 mg/dL (ref 6–23)
CO2: 30 mEq/L (ref 19–32)
Calcium: 9.2 mg/dL (ref 8.4–10.5)
Chloride: 105 mEq/L (ref 96–112)
Creatinine, Ser: 1.26 mg/dL — ABNORMAL HIGH (ref 0.40–1.20)
GFR: 42.12 mL/min — ABNORMAL LOW (ref >60.00)
Glucose, Bld: 116 mg/dL — ABNORMAL HIGH (ref 70–99)
Potassium: 5 mEq/L (ref 3.5–5.1)
Sodium: 140 mEq/L (ref 135–145)
Total Bilirubin: 0.6 mg/dL (ref 0.2–1.2)
Total Protein: 5.7 g/dL — ABNORMAL LOW (ref 6.0–8.3)

## 2018-08-22 LAB — LIPID PANEL
Cholesterol: 208 mg/dL — ABNORMAL HIGH (ref 0–200)
HDL: 50.8 mg/dL (ref >39.00)
LDL Cholesterol: 137 mg/dL — ABNORMAL HIGH (ref 0–99)
NonHDL: 157.36
Total CHOL/HDL Ratio: 4
Triglycerides: 100 mg/dL (ref 0.0–149.0)
VLDL: 20 mg/dL (ref 0.0–40.0)

## 2018-08-22 LAB — HEMOGLOBIN A1C: Hgb A1c MFr Bld: 6.5 % (ref 4.6–6.5)

## 2018-08-22 NOTE — Telephone Encounter (Signed)
-----   Message from Ellamae Sia sent at 08/16/2018 11:32 AM EDT ----- Regarding: Lab orders for Thursday, 4.23.20 Lab orders, thanks

## 2018-08-26 ENCOUNTER — Other Ambulatory Visit: Payer: Self-pay | Admitting: *Deleted

## 2018-08-26 ENCOUNTER — Telehealth: Payer: Self-pay | Admitting: *Deleted

## 2018-08-26 MED ORDER — ATORVASTATIN CALCIUM 10 MG PO TABS: 10.0000 mg | ORAL_TABLET | Freq: Every day | ORAL | 3 refills | Status: DC

## 2018-08-26 NOTE — Telephone Encounter (Signed)
Patient left a voicemail stating that she spoke to someone Thursday about her lab results. Patient stated that a script was suppose to be sent to [facility] for her cholesterol and they have not received it yet. Patient wants to know when this is taken care of.

## 2018-08-26 NOTE — Telephone Encounter (Signed)
Rebekah Johnston notified by telephone.

## 2018-08-26 NOTE — Telephone Encounter (Signed)
Duplicate-opened in error. 

## 2018-08-26 NOTE — Telephone Encounter (Signed)
Rx sent 

## 2018-09-30 IMAGING — CR DG KNEE COMPLETE 4+V*R*
4 series · 4 of 4 positions shown · non-contrast
Comparison: 01/18/2016

CLINICAL DATA: Right knee pain after fall 1 month ago

EXAM:
RIGHT KNEE - COMPLETE 4+ VIEW

[knee ap]
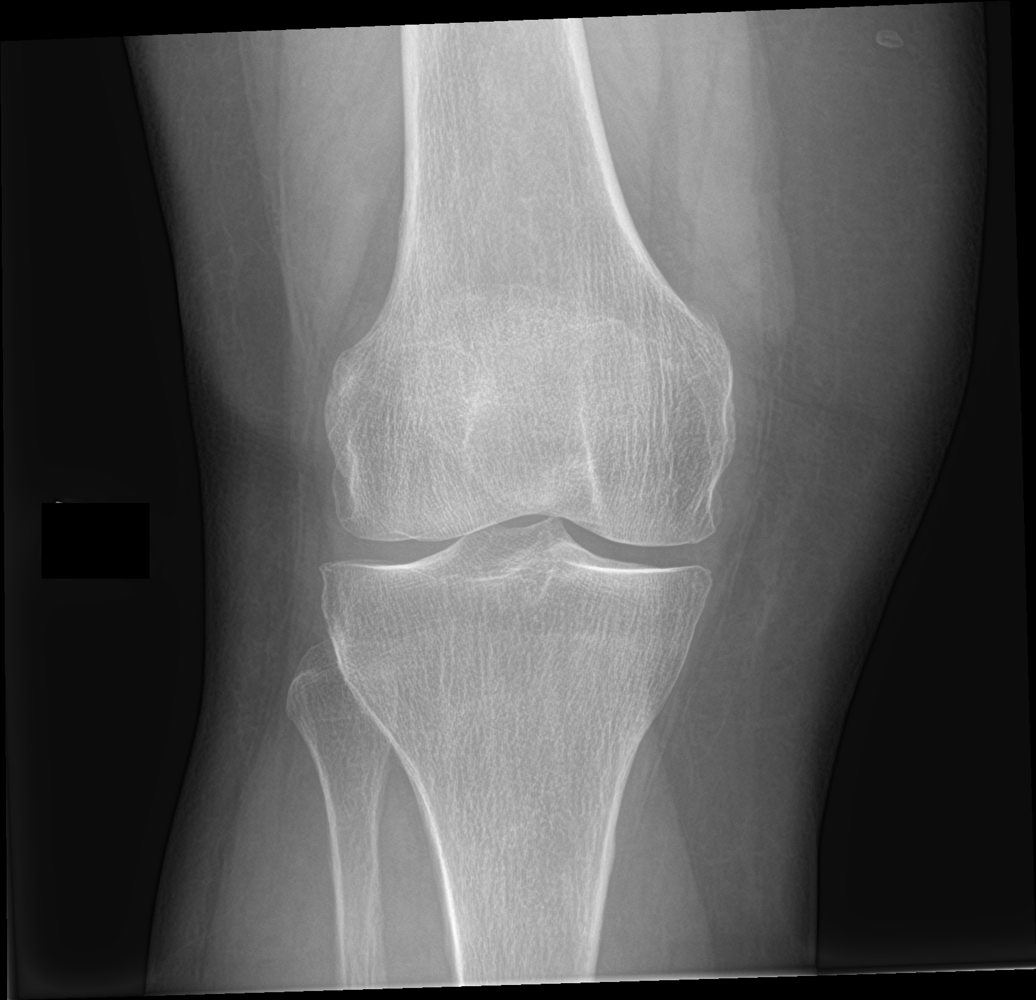

[knee obl (1 of 2)]
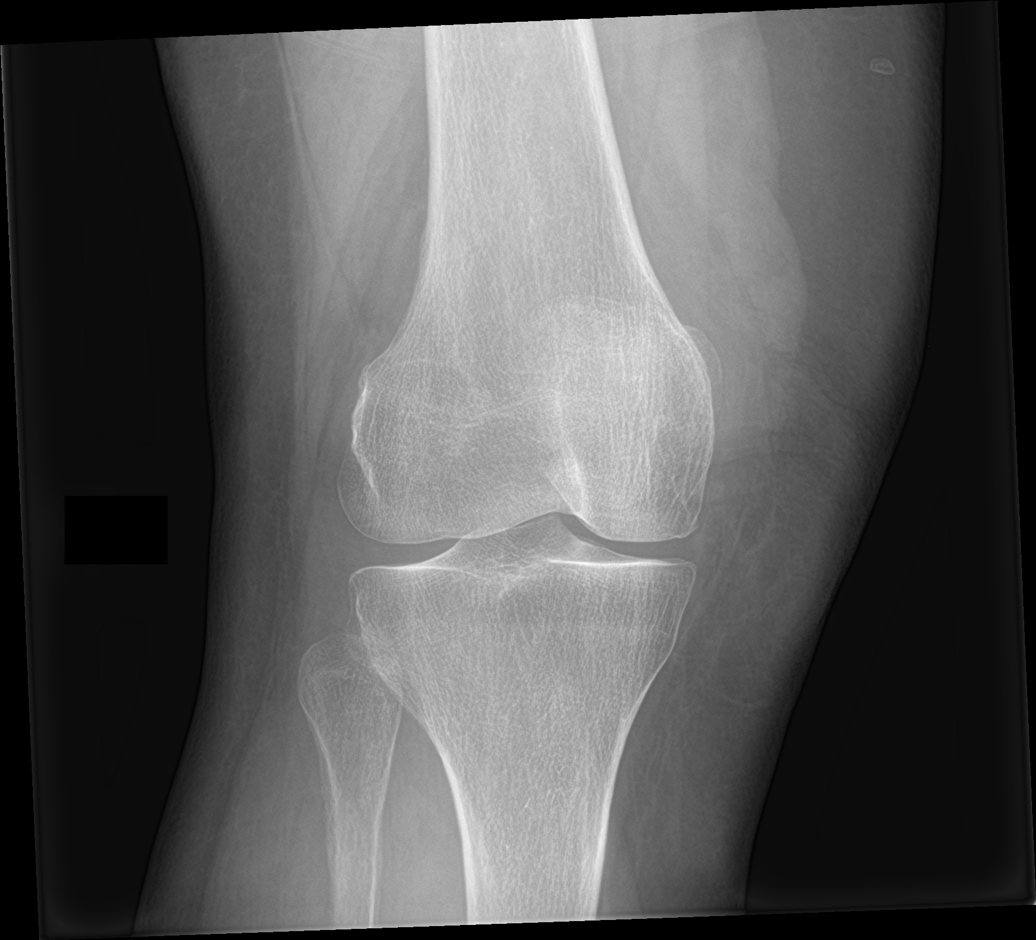

[knee obl (2 of 2)]
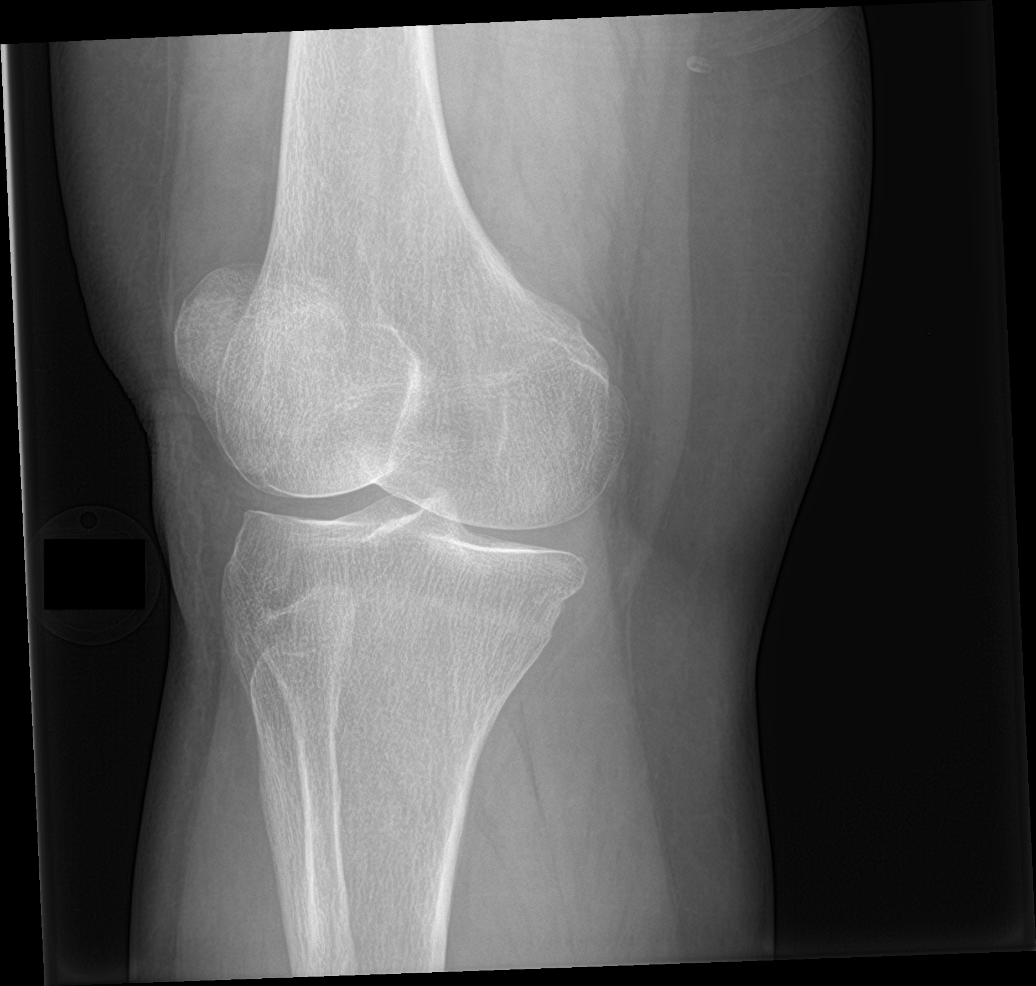

[knee lat]
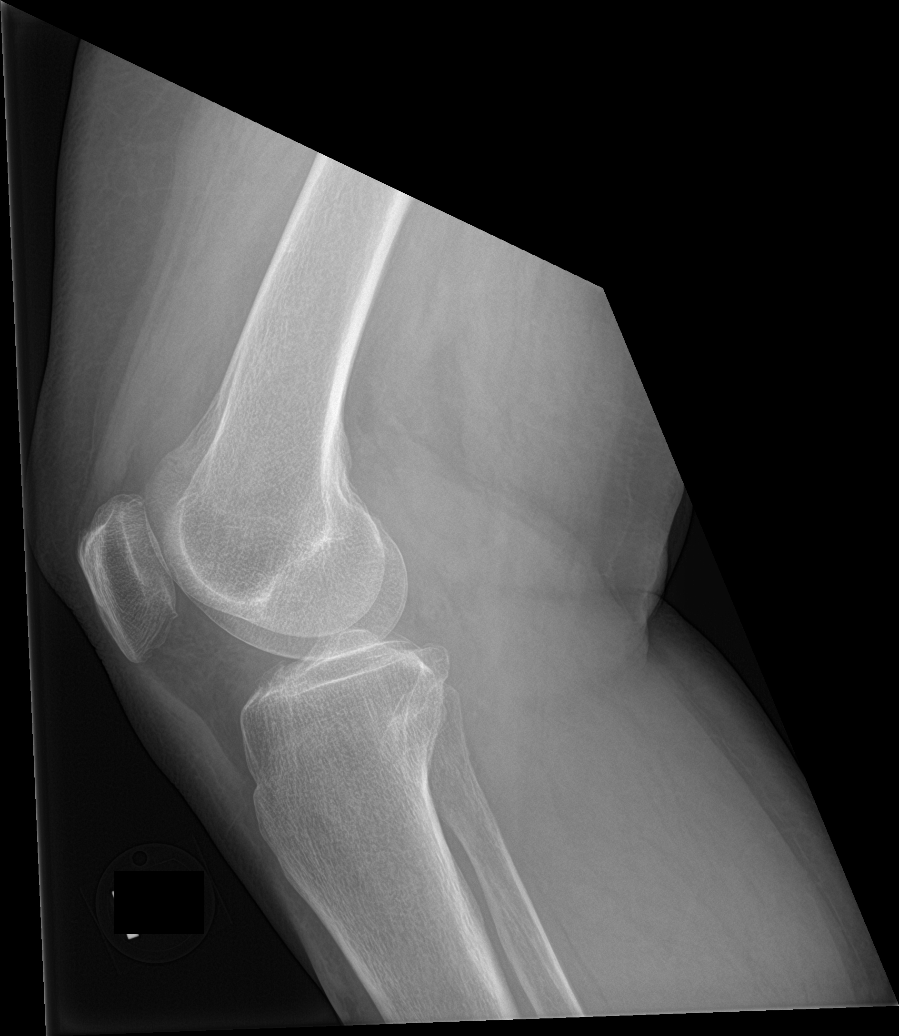

[4 of 4 positions shown; findings below may reference images not displayed]

FINDINGS: Slight femorotibial joint space narrowing. Small suprapatellar joint
effusion with induration of Hoffa's fat pad, just caudal to the
patella. Minimal degenerative spurring is seen anterior to the
tibial spine on the lateral view. No acute displaced appearing
fracture is visualized. No malalignment.
IMPRESSION: Small suprapatellar joint effusion with mild induration of the
infrapatellar fat pad possibly from the joint effusion. Minimal
degenerative spurring is noted anteriorly and adjacent to the tibial
spine. No acute appearing fracture, intra-articular loose body nor
bone destruction is noted.

## 2018-10-21 ENCOUNTER — Ambulatory Visit (INDEPENDENT_AMBULATORY_CARE_PROVIDER_SITE_OTHER): Payer: PPO | Admitting: Family Medicine

## 2018-10-21 ENCOUNTER — Encounter: Payer: Self-pay | Admitting: Family Medicine

## 2018-10-21 DIAGNOSIS — Z7189 Other specified counseling: Secondary | ICD-10-CM | POA: Diagnosis not present

## 2018-10-21 NOTE — Patient Instructions (Signed)
Hi Ms. Teat,  It was a pleasure to speak with you on the phone regarding COVID- 19 testing.   Please contact the office for testing if you develop symptoms (fever over 100.3, cough, shortness of breath, loss of taste/smell) or if anyone you have been in direct contact with tests positive.   Continue to mask in public, avoid large gatherings, maintain social distancing of 6 feet, wash hands frequently and thoroughly.   I have included some information below regarding COVID-19.   Warm regards,  Tor Netters, FNP-BC  Coronavirus (COVID-19) Are you at risk?  Are you at risk for the Coronavirus (COVID-19)?  To be considered HIGH RISK for Coronavirus (COVID-19), you have to meet the following criteria:  . Traveled to [place], [place], [place], [place] or [place]; or in the [place] to [place], [place], [place], or [place]; and have fever, cough, and shortness of breath within the last 2 weeks of travel OR . Been in close contact with a person diagnosed with COVID-19 within the last 2 weeks and have fever, cough, and shortness of breath . IF YOU DO NOT MEET THESE CRITERIA, YOU ARE CONSIDERED LOW RISK FOR COVID-19.  What to do if you are HIGH RISK for COVID-19?  Marland Kitchen If you are having a medical emergency, call 911. . Seek medical care right away. Before you go to a doctor's office, urgent care or emergency department, call ahead and tell them about your recent travel, contact with someone diagnosed with COVID-19, and your symptoms. You should receive instructions from your physician's office regarding next steps of care.  . When you arrive at healthcare provider, tell the healthcare staff immediately you have returned from visiting [place], [place], [place], [place] or [place]; or traveled in the [place] to [place], [place], [place], or [place]; in the last two weeks or you have been in close contact with a person diagnosed with COVID-19 in the last 2 weeks.   . Tell the  health care staff about your symptoms: fever, cough and shortness of breath. . After you have been seen by a medical provider, you will be either: o Tested for (COVID-19) and discharged home on quarantine except to seek medical care if symptoms worsen, and asked to  - Stay home and avoid contact with others until you get your results (4-5 days)  - Avoid travel on public transportation if possible (such as bus, train, or airplane) or o Sent to the Emergency Department by EMS for evaluation, COVID-19 testing, and possible admission depending on your condition and test results.  What to do if you are LOW RISK for COVID-19?  Reduce your risk of any infection by using the same precautions used for avoiding the common cold or flu:  Marland Kitchen Wash your hands often with soap and warm water for at least 20 seconds.  If soap and water are not readily available, use an alcohol-based hand sanitizer with at least 60% alcohol.  . If coughing or sneezing, cover your mouth and nose by coughing or sneezing into the elbow areas of your shirt or coat, into a tissue or into your sleeve (not your hands). . Avoid shaking hands with others and consider head nods or verbal greetings only. . Avoid touching your eyes, nose, or mouth with unwashed hands.  . Avoid close contact with people who are sick. . Avoid places or events with large numbers of people in one location, like concerts or sporting events. . Carefully consider  travel plans you have or are making. . If you are planning any travel outside or inside the [place], visit the CDC's Travelers' Health webpage for the latest health notices. . If you have some symptoms but not all symptoms, continue to monitor at home and seek medical attention if your symptoms worsen. . If you are having a medical emergency, call 911.   [place] / e-Visit: [email or web address]         [facility]: [facility]: [phone]                   [facility]: [phone]

## 2018-10-21 NOTE — Progress Notes (Signed)
Virtual Visit via Telephone Note  I connected with Rebekah Johnston on 10/21/18 at  9:00 AM EDT by telephone and verified that I am speaking with the correct person using two identifiers.  Location: Patient: In her home Provider: [place]   I discussed the limitations, risks, security and privacy concerns of performing an evaluation and management service by telephone and the availability of in person appointments. I also discussed with the patient that there may be a patient responsible charge related to this service. The patient expressed understanding and agreed to proceed.   History of Present Illness: This is a 68 yo female who requests telephone visit to discuss possible covid exposure. She has been around family members who were around a positive contact. Her family members have not had symptoms, she is not sure whether or not they were tested. She was in close proximity to her family members but not the person who was positive. She did not wear a mask. She denies any symptoms- fever, cough, SOB, loss of taste/smell.   Past Medical History:  Diagnosis Date  . Asthma    no inhalers  . Bronchitis    taking meds now  . Chronic kidney disease 2008   Stage G3b/A1  . Depression   . GERD (gastroesophageal reflux disease)   . Glaucoma    no eye drops  . Hyperlipidemia    no meds  . Macular degeneration    Bil  . Osteoarthritis    Past Surgical History:  Procedure Laterality Date  . BUNIONECTOMY     rt. foot  . CHOLECYSTECTOMY    . HERNIA REPAIR     left inguinal hernia  . TONSILLECTOMY  1967   Family History  Problem Relation Age of Onset  . Prostate cancer Father   . Dementia Mother   . Diabetes Brother   . Melanoma Other        Uncle  . Colon cancer Neg Hx   . Esophageal cancer Neg Hx   . Rectal cancer Neg Hx   . Stomach cancer Neg Hx    Social History   Tobacco Use  . Smoking status: Former Smoker    Quit date: 05/23/2001    Years since quitting:  17.4  . Smokeless tobacco: Never Used  . Tobacco comment: smoked socially not everyday thing.  Substance Use Topics  . Alcohol use: Yes    Alcohol/week: 1.0 standard drinks    Types: 1 Glasses of wine per week    Comment: occasional once a month  . Drug use: No      Observations/Objective: The patient was alert, answered questions appropriately. She did not sound short of breath.   There were no vitals taken for this visit. BP Readings from Last 3 Encounters:  07/09/18 110/80  06/20/18 136/85  05/23/18 120/78   Wt Readings from Last 3 Encounters:  07/09/18 199 lb (90.3 kg)  06/20/18 200 lb (90.7 kg)  05/23/18 200 lb 8 oz (90.9 kg)    Assessment and Plan: 1. Advice Given About Covid-19 Virus Infection - patient asymptomatic and without direct exposure to known positive case.  - provided instruction for follow up as needed- if one of her direct contacts has a positive test result or if she develops symptoms which were reviewed with her - encouraged her to wash hands regularly and thoroughly, wear a mask when outside her home, maintain social distancing and avoid large crowds.   Clarene Reamer, FNP-BC  [facility]  at [facility]  10/21/2018 9:10 AM   Follow Up Instructions: AVS mailed to home address on file   I discussed the assessment and treatment plan with the patient. The patient was provided an opportunity to ask questions and all were answered. The patient agreed with the plan and demonstrated an understanding of the instructions.   The patient was advised to call back or seek an in-person evaluation if the symptoms worsen or if the condition fails to improve as anticipated.  I provided 8 minutes of non-face-to-face time during this encounter.   Elby Beck, FNP

## 2018-11-20 DIAGNOSIS — L578 Other skin changes due to chronic exposure to nonionizing radiation: Secondary | ICD-10-CM | POA: Diagnosis not present

## 2018-11-20 DIAGNOSIS — L918 Other hypertrophic disorders of the skin: Secondary | ICD-10-CM | POA: Diagnosis not present

## 2018-11-20 DIAGNOSIS — L570 Actinic keratosis: Secondary | ICD-10-CM | POA: Diagnosis not present

## 2018-11-20 DIAGNOSIS — Z872 Personal history of diseases of the skin and subcutaneous tissue: Secondary | ICD-10-CM | POA: Diagnosis not present

## 2018-11-26 ENCOUNTER — Ambulatory Visit (INDEPENDENT_AMBULATORY_CARE_PROVIDER_SITE_OTHER): Payer: PPO | Admitting: Family Medicine

## 2018-11-26 ENCOUNTER — Other Ambulatory Visit: Payer: Self-pay

## 2018-11-26 ENCOUNTER — Encounter: Payer: Self-pay | Admitting: Family Medicine

## 2018-11-26 VITALS — BP 110/70 | HR 64 | Temp 98.2°F | Ht 63.5 in | Wt 195.8 lb

## 2018-11-26 DIAGNOSIS — M19011 Primary osteoarthritis, right shoulder: Secondary | ICD-10-CM

## 2018-11-26 NOTE — Patient Instructions (Signed)
Call if soreness in area or significant change.

## 2018-11-26 NOTE — Progress Notes (Signed)
Chief Complaint  Patient presents with  . Lump on front of neck    History of Present Illness: HPI    68 year old female presents with new onset lump on right clavicle. Noted in last few days... noted given she was running hand over anterior chest.  No pain, no redness.  No arm numbness, no numbness,   No neck stiffness and soreness.  no trouble swallowing.   She fell 2 times in last 3 months, one tripa nd one tipped out of chair... no known injury. Caught self on arms. Accidental fall.   COVID 19 screen No recent travel or known exposure to COVID19 The patient denies respiratory symptoms of COVID 19 at this time.  The importance of social distancing was discussed today.   Review of Systems  Constitutional: Negative for chills and fever.  HENT: Negative for congestion and ear pain.   Eyes: Negative for pain and redness.  Respiratory: Negative for cough and shortness of breath.   Cardiovascular: Negative for chest pain, palpitations and leg swelling.  Gastrointestinal: Negative for abdominal pain, blood in stool, constipation, diarrhea, nausea and vomiting.  Genitourinary: Negative for dysuria.  Musculoskeletal: Negative for falls and myalgias.  Skin: Negative for rash.  Neurological: Negative for dizziness.  Psychiatric/Behavioral: Negative for depression. The patient is not nervous/anxious.       Past Medical History:  Diagnosis Date  . Asthma    no inhalers  . Bronchitis    taking meds now  . Chronic kidney disease 2008   Stage G3b/A1  . Depression   . GERD (gastroesophageal reflux disease)   . Glaucoma    no eye drops  . Hyperlipidemia    no meds  . Macular degeneration    Bil  . Osteoarthritis     reports that she quit smoking about 17 years ago. She has never used smokeless tobacco. She reports current alcohol use of about 1.0 standard drinks of alcohol per week. She reports that she does not use drugs.   Current Outpatient Medications:  .  acetaminophen  (TYLENOL) 325 MG tablet, Take 650 mg by mouth every 6 (six) hours as needed., Disp: , Rfl:  .  albuterol (PROVENTIL HFA;VENTOLIN HFA) 108 (90 Base) MCG/ACT inhaler, Inhale 1-2 puffs into the lungs every 6 (six) hours as needed for wheezing or shortness of breath., Disp: 1 Inhaler, Rfl: 0 .  atorvastatin (LIPITOR) 10 MG tablet, Take 1 tablet (10 mg total) by mouth daily., Disp: 90 tablet, Rfl: 3 .  Cholecalciferol (VITAMIN D3) 2000 units TABS, Take by mouth daily., Disp: , Rfl:  .  citalopram (CELEXA) 40 MG tablet, Take 1 tablet (40 mg total) by mouth daily., Disp: 90 tablet, Rfl: 1 .  fluticasone (FLONASE) 50 MCG/ACT nasal spray, Place 2 sprays into both nostrils as needed. , Disp: , Rfl:  .  pantoprazole (PROTONIX) 40 MG tablet, Take 40 mg by mouth daily., Disp: , Rfl: 11 .  Spacer/Aero-Holding Chambers (AEROCHAMBER PLUS) inhaler, Use as instructed, Disp: 1 each, Rfl: 2   Observations/Objective: Blood pressure 110/70, pulse 64, temperature 98.2 F (36.8 C), temperature source Temporal, height 5' 3.5" (1.613 m), weight 195 lb 12 oz (88.8 kg).  Physical Exam Constitutional:      General: She is not in acute distress.    Appearance: Normal appearance. She is well-developed. She is not ill-appearing or toxic-appearing.  HENT:     Head: Normocephalic.     Right Ear: Hearing, tympanic membrane, ear canal and external ear  normal. Tympanic membrane is not erythematous, retracted or bulging.     Left Ear: Hearing, tympanic membrane, ear canal and external ear normal. Tympanic membrane is not erythematous, retracted or bulging.     Nose: No mucosal edema or rhinorrhea.     Right Sinus: No maxillary sinus tenderness or frontal sinus tenderness.     Left Sinus: No maxillary sinus tenderness or frontal sinus tenderness.     Mouth/Throat:     Pharynx: Uvula midline.  Eyes:     General: Lids are normal. Lids are everted, no foreign bodies appreciated.     Conjunctiva/sclera: Conjunctivae normal.      Pupils: Pupils are equal, round, and reactive to light.  Neck:     Musculoskeletal: Normal range of motion and neck supple.     Thyroid: No thyroid mass or thyromegaly.     Vascular: No carotid bruit.     Trachea: Trachea normal.  Cardiovascular:     Rate and Rhythm: Normal rate and regular rhythm.     Pulses: Normal pulses.     Heart sounds: Normal heart sounds, S1 normal and S2 normal. No murmur. No friction rub. No gallop.   Pulmonary:     Effort: Pulmonary effort is normal. No tachypnea or respiratory distress.     Breath sounds: Normal breath sounds. No decreased breath sounds, wheezing, rhonchi or rales.  Abdominal:     General: Bowel sounds are normal.     Palpations: Abdomen is soft.     Tenderness: There is no abdominal tenderness.  Musculoskeletal:     Right shoulder: She exhibits normal range of motion and no tenderness.     Cervical back: She exhibits normal range of motion, no tenderness and no bony tenderness.     Comments: Right prominence in sternoclavicular joint.. no mobile, no redness, no fluctuance  Lymphadenopathy:     Head:     Right side of head: No submental, submandibular, tonsillar, preauricular, posterior auricular or occipital adenopathy.     Left side of head: No submental, submandibular, tonsillar, preauricular, posterior auricular or occipital adenopathy.     Cervical: No cervical adenopathy.     Upper Body:     Right upper body: No supraclavicular adenopathy.     Left upper body: No supraclavicular adenopathy.     Comments:  No thyromegaly  Skin:    General: Skin is warm and dry.     Findings: No rash.  Neurological:     Mental Status: She is alert.  Psychiatric:        Mood and Affect: Mood is not anxious or depressed.        Speech: Speech normal.        Behavior: Behavior normal. Behavior is cooperative.        Thought Content: Thought content normal.        Judgment: Judgment normal.      Assessment and Plan      Eliezer Lofts, MD

## 2018-12-11 ENCOUNTER — Other Ambulatory Visit: Payer: Self-pay | Admitting: Family Medicine

## 2018-12-11 ENCOUNTER — Telehealth: Payer: Self-pay | Admitting: *Deleted

## 2018-12-11 NOTE — Telephone Encounter (Signed)
Pt left v/m wanting to know why atorvastatin was not refilled. I spoke with Danae Chen at [facility] and atorvastatin refill is being done now and pt can pick up later this evening. Pt notified and voiced understanding. Nothing further needed now.

## 2018-12-11 NOTE — Telephone Encounter (Signed)
Patient left a voicemail stating that she needs a refill on her Atorvastatin if Dr. Diona Browner wants her to stay on the medication. Left message on voicemail for patient to call back. Script was sent to [facility] on 08/26/18 #90/3 refills. Patient should have refills, our records indication that they received the script.

## 2018-12-25 ENCOUNTER — Other Ambulatory Visit: Payer: Self-pay | Admitting: Family Medicine

## 2019-02-18 DIAGNOSIS — H43811 Vitreous degeneration, right eye: Secondary | ICD-10-CM | POA: Diagnosis not present

## 2019-03-11 ENCOUNTER — Other Ambulatory Visit: Payer: Self-pay

## 2019-03-11 ENCOUNTER — Emergency Department
Admission: EM | Admit: 2019-03-11 | Discharge: 2019-03-11 | Disposition: A | Discharge status: Home / Self Care | Payer: PPO | Attending: Student | Admitting: Student

## 2019-03-11 ENCOUNTER — Telehealth: Payer: Self-pay | Admitting: Family Medicine

## 2019-03-11 ENCOUNTER — Emergency Department: Payer: PPO

## 2019-03-11 DIAGNOSIS — G8929 Other chronic pain: Secondary | ICD-10-CM

## 2019-03-11 DIAGNOSIS — Y93F9 Activity, other caregiving: Secondary | ICD-10-CM | POA: Diagnosis not present

## 2019-03-11 DIAGNOSIS — S80911A Unspecified superficial injury of right knee, initial encounter: Secondary | ICD-10-CM | POA: Diagnosis present

## 2019-03-11 DIAGNOSIS — J45909 Unspecified asthma, uncomplicated: Secondary | ICD-10-CM | POA: Insufficient documentation

## 2019-03-11 DIAGNOSIS — Y990 Civilian activity done for income or pay: Secondary | ICD-10-CM | POA: Diagnosis not present

## 2019-03-11 DIAGNOSIS — Z79899 Other long term (current) drug therapy: Secondary | ICD-10-CM | POA: Insufficient documentation

## 2019-03-11 DIAGNOSIS — Y9259 Other trade areas as the place of occurrence of the external cause: Secondary | ICD-10-CM | POA: Insufficient documentation

## 2019-03-11 DIAGNOSIS — Z87891 Personal history of nicotine dependence: Secondary | ICD-10-CM | POA: Diagnosis not present

## 2019-03-11 DIAGNOSIS — S86912A Strain of unspecified muscle(s) and tendon(s) at lower leg level, left leg, initial encounter: Secondary | ICD-10-CM | POA: Diagnosis not present

## 2019-03-11 DIAGNOSIS — N189 Chronic kidney disease, unspecified: Secondary | ICD-10-CM | POA: Diagnosis not present

## 2019-03-11 DIAGNOSIS — M25462 Effusion, left knee: Secondary | ICD-10-CM | POA: Diagnosis not present

## 2019-03-11 DIAGNOSIS — X500XXA Overexertion from strenuous movement or load, initial encounter: Secondary | ICD-10-CM | POA: Insufficient documentation

## 2019-03-11 DIAGNOSIS — M25561 Pain in right knee: Secondary | ICD-10-CM

## 2019-03-11 MED ORDER — HYDROCODONE-ACETAMINOPHEN 5-325 MG PO TABS: 1.0000 | ORAL_TABLET | ORAL | 0 refills | Status: DC | PRN

## 2019-03-11 MED ORDER — OXYCODONE-ACETAMINOPHEN 5-325 MG PO TABS
1.0000 | ORAL_TABLET | Freq: Once | ORAL | Status: AC
  Administered 2019-03-11: 1 via ORAL
  Filled 2019-03-11: qty 1

## 2019-03-11 MED ORDER — PREDNISONE 50 MG PO TABS: 50.0000 mg | ORAL_TABLET | Freq: Every day | ORAL | 0 refills | Status: DC

## 2019-03-11 NOTE — ED Triage Notes (Signed)
Pt presents via POV c/o left knee pain worsening. Reports has chronic left knee pain however getting worse. Denies injury.

## 2019-03-11 NOTE — Telephone Encounter (Signed)
Patient  Stated that she is still having a lot of trouble with with her knee. She would like to know if you could refer her to a specialist  Patient is not sure if it is time for her to discuss a possible knee replacement.    Patient stated that she has heard several people talk about Dr [place] Bells, But she would be okay seeing anyone you suggest in the [place] area.

## 2019-03-11 NOTE — ED Notes (Signed)
Will give medication once patient has secured a ride home. Patient is trying to contact her sons.

## 2019-03-11 NOTE — ED Provider Notes (Signed)
[facility] Emergency Department Provider Note  ____________________________________________  Time seen: Approximately 10:16 PM  I have reviewed the triage vital signs and the nursing notes.   HISTORY  Chief Complaint Knee Pain    HPI Rebekah Johnston is a 69 y.o. female who presents the emergency department complaining of left knee pain.  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  Pain has been much worse than normal tonight.  Patient denies any specific injury while this incident occurred.  Patient states that she was actually scheduled to see orthopedics regarding her ongoing knee pain prior to this incident today.  Patient denies any back pain.  No radicular symptoms.  No other injury or complaint at this time.  Patient has medical history as described below.        Past Medical History:  Diagnosis Date  . Asthma    no inhalers  . Bronchitis    taking meds now  . Chronic kidney disease 2008   Stage G3b/A1  . Depression   . GERD (gastroesophageal reflux disease)   . Glaucoma    no eye drops  . Hyperlipidemia    no meds  . Macular degeneration    Bil  . Osteoarthritis     Patient Active Problem List   Diagnosis Date Noted  . Cough 07/09/2018  . ETD (Eustachian tube dysfunction), left 07/09/2018  . Vitamin D deficiency 05/10/2017  . Medicare annual wellness visit, subsequent 04/17/2017  . Trochanteric bursitis of left hip 03/09/2017  . Osteopenia 05/16/2016  . Major depressive disorder, recurrent episode, moderate ([place]) 04/04/2016  . Gastroesophageal reflux disease 04/04/2016  . Low back pain 01/18/2016  . Bilateral chronic knee pain 01/18/2016  . Counseling regarding end of life decision making 03/18/2015  . Chronic kidney disease (CKD) stage G3b/A1, moderately decreased glomerular filtration  rate (GFR) between 30-44 mL/min/1.73 square meter and albuminuria creatinine ratio less than 30 mg/g 08/20/2008  . HYPERCHOLESTEROLEMIA 05/20/2008  . ALLERGIC RHINITIS 04/16/2008  . EXERCISE INDUCED ASTHMA 04/16/2008  . OSTEOARTHRITIS 04/16/2008  . PREDIABETES 04/16/2008  . UMBILICAL HERNIORRHAPHY, HX OF 04/16/2008    Past Surgical History:  Procedure Laterality Date  . BUNIONECTOMY     rt. foot  . CHOLECYSTECTOMY    . HERNIA REPAIR     left inguinal hernia  . TONSILLECTOMY  1967    Prior to Admission medications   Medication Sig Start Date End Date Taking? Authorizing Provider  acetaminophen (TYLENOL) 325 MG tablet Take 650 mg by mouth every 6 (six) hours as needed.    [provider]  albuterol (PROVENTIL HFA;VENTOLIN HFA) 108 (90 Base) MCG/ACT inhaler Inhale 1-2 puffs into the lungs every 6 (six) hours as needed for wheezing or shortness of breath. 06/20/18   Melynda Ripple, MD  atorvastatin (LIPITOR) 10 MG tablet Take 1 tablet (10 mg total) by mouth daily. 08/26/18   Bedsole, Amy E, MD  Cholecalciferol (VITAMIN D3) 2000 units TABS Take by mouth daily.    [provider]  citalopram (CELEXA) 40 MG tablet TAKE 1 TABLET BY MOUTH EVERY DAY 12/25/18   Bedsole, Amy E, MD  fluticasone (FLONASE) 50 MCG/ACT nasal spray Place 2 sprays into both nostrils as needed.     [provider]  HYDROcodone-acetaminophen (NORCO/VICODIN) 5-325 MG tablet Take 1 tablet by mouth every 4 (four) hours as needed for  moderate pain. 03/11/19   , Charline Bills, PA-C  pantoprazole (PROTONIX) 40 MG tablet Take 40 mg by mouth daily. 02/16/15   [provider]  predniSONE (DELTASONE) 50 MG tablet Take 1 tablet (50 mg total) by mouth daily with breakfast. 03/11/19   , Charline Bills, PA-C  Spacer/Aero-Holding Chambers (AEROCHAMBER PLUS) inhaler Use as instructed 06/20/18   Melynda Ripple, MD    Allergies Patient has no known allergies.  Family History  Problem  Relation Age of Onset  . Prostate cancer Father   . Dementia Mother   . Diabetes Brother   . Melanoma Other        Uncle  . Colon cancer Neg Hx   . Esophageal cancer Neg Hx   . Rectal cancer Neg Hx   . Stomach cancer Neg Hx     Social History Social History   Tobacco Use  . Smoking status: Former Smoker    Quit date: 05/23/2001    Years since quitting: 17.8  . Smokeless tobacco: Never Used  . Tobacco comment: smoked socially not everyday thing.  Substance Use Topics  . Alcohol use: Yes    Alcohol/week: 1.0 standard drinks    Types: 1 Glasses of wine per week    Comment: occasional once a month  . Drug use: No     Review of Systems  Constitutional: No fever/chills Eyes: No visual changes. No discharge ENT: No upper respiratory complaints. Cardiovascular: no chest pain. Respiratory: no cough. No SOB. Gastrointestinal: No abdominal pain.  No nausea, no vomiting.  No diarrhea.  No constipation. Musculoskeletal: Positive for acute on chronic left knee pain Skin: Negative for rash, abrasions, lacerations, ecchymosis. Neurological: Negative for headaches, focal weakness or numbness. 10-point ROS otherwise negative.  ____________________________________________   PHYSICAL EXAM:  VITAL SIGNS: ED Triage Vitals  Enc Vitals Group     BP 03/11/19 2052 (!) 144/81     Pulse Rate 03/11/19 2052 65     Resp 03/11/19 2052 14     Temp 03/11/19 2052 98.6 F (37 C)     Temp Source 03/11/19 2052 Oral     SpO2 03/11/19 2052 97 %     Weight --      Height --      Head Circumference --      Peak Flow --      Pain Score 03/11/19 2054 5     Pain Loc --      Pain Edu? --      Excl. in [place]? --      Constitutional: Alert and oriented. Well appearing and in no acute distress. Eyes: Conjunctivae are normal. PERRL. EOMI. Head: Atraumatic. ENT:      Ears:       Nose: No congestion/rhinnorhea.      Mouth/Throat: Mucous membranes are moist.  Neck: No stridor.    Cardiovascular:  Normal rate, regular rhythm. Normal S1 and S2.  Good peripheral circulation. Respiratory: Normal respiratory effort without tachypnea or retractions. Lungs CTAB. Good air entry to the bases with no decreased or absent breath sounds. Musculoskeletal: Full range of motion to all extremities. No gross deformities appreciated.  Visualization of the left knee reveals mild edema to the lateral aspect of the knee when compared with right.  Patient has full range of motion to the knee at this time.  Patient is slightly tender to palpation bilateral suprapatellar region as well as the lateral joint line.  No palpable abnormality or significant ballottement appreciated on exam.  Varus, valgus, Lachman's and McMurray's is negative.  Results pedis pulses sensation intact distally. Neurologic:  Normal speech and language. No gross focal neurologic deficits are appreciated.  Skin:  Skin is warm, dry and intact. No rash noted. Psychiatric: Mood and affect are normal. Speech and behavior are normal. Patient exhibits appropriate insight and judgement.   ____________________________________________   LABS (all labs ordered are listed, but only abnormal results are displayed)  Labs Reviewed - No data to display ____________________________________________  EKG   ____________________________________________  RADIOLOGY I personally viewed and evaluated these images as part of my medical decision making, as well as reviewing the written report by the radiologist.  Dg Knee Complete 4 Views Left  Result Date: 03/11/2019 CLINICAL DATA:  Knee pain EXAM: LEFT KNEE - COMPLETE 4+ VIEW COMPARISON:  May 23, 2018 FINDINGS: No evidence of fracture, dislocation. There is a small knee joint effusion. No evidence of arthropathy or other focal bone abnormality. Soft tissues are unremarkable. IMPRESSION: No acute osseous abnormality.  Small knee joint effusion. Electronically Signed   By: Prudencio Pair M.D.   On: 03/11/2019  22:35    ____________________________________________    PROCEDURES  Procedure(s) performed:    Procedures    Medications  oxyCODONE-acetaminophen (PERCOCET/ROXICET) 5-325 MG per tablet 1 tablet (1 tablet Oral Given 03/11/19 2245)     ____________________________________________   INITIAL IMPRESSION / ASSESSMENT AND PLAN / ED COURSE  Pertinent labs & imaging results that were available during my care of the patient were reviewed by me and considered in my medical decision making (see chart for details).  Review of the [place] CSRS was performed in accordance of the [place] prior to dispensing any controlled drugs.           Patient's diagnosis is consistent with knee strain.  Patient presented to emergency department with acute on chronic left knee pain.  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  The individual she was watching had a near fall and she attempted to catch the patient.  She denied remembering injuring her knee but has had increased left knee pain since this occurrence.  Overall exam is reassuring.  X-ray is reassuring at this time.  Patient will be prescribed short course of steroid and limited pain medication as she is unable to take NSAIDs due to her chronic kidney disease.  Patient is to follow-up orthopedics for her ongoing chronic knee pain..  Patient is given ED precautions to return to the ED for any worsening or new symptoms.     ____________________________________________  FINAL CLINICAL IMPRESSION(S) / ED DIAGNOSES  Final diagnoses:  Strain of left knee, initial encounter      NEW MEDICATIONS STARTED DURING THIS VISIT:  ED Discharge Orders         Ordered    HYDROcodone-acetaminophen (NORCO/VICODIN) 5-325 MG tablet  Every 4 hours PRN     03/11/19 2257    predniSONE (DELTASONE) 50 MG tablet  Daily with breakfast     03/11/19 2257              This chart was dictated using voice recognition  software/Dragon. Despite best efforts to proofread, errors can occur which can change the meaning. Any change was purely unintentional.    Darletta Moll, PA-C 03/11/19 2302    Lilia Pro., MD 03/12/19 1131

## 2019-03-11 NOTE — Telephone Encounter (Signed)
Referral sent 

## 2019-03-13 ENCOUNTER — Telehealth: Payer: Self-pay

## 2019-03-13 DIAGNOSIS — M1711 Unilateral primary osteoarthritis, right knee: Secondary | ICD-10-CM | POA: Diagnosis not present

## 2019-03-13 DIAGNOSIS — M1712 Unilateral primary osteoarthritis, left knee: Secondary | ICD-10-CM | POA: Diagnosis not present

## 2019-03-13 NOTE — Telephone Encounter (Signed)
Rebekah Johnston notified as instructed by telephone about DVT concerns per Dr. Diona Browner.  Patient states understanding.  She will see Ortho at 1 pm today and follow up with Dr. Diona Browner if needed.

## 2019-03-13 NOTE — Telephone Encounter (Signed)
Great!

## 2019-03-13 NOTE — Telephone Encounter (Addendum)
Rebekah Johnston is working on getting ortho referral for pt. Unable to reach pt by phone and left v/m for pt to cb [facility]. Pt called back and said she was talking with [facility] when I called. Pt said after taking 2nd hydrocodone apap 5-325 mg and ice last night she did get relief from pain. Pt has not had to take a pain pill this morning; pt did take the prednisone this morning. Pt said her daughter or daughter in law is a Marine scientist and is concerned about blood clot in leg; pt did not think to ask ED provider about this when she was in ED and when called ED back was told would need to come in for eval and several hrs of wait time. Pt wants to know if Dr Diona Browner thinks she needs an [place] to R/O DVT. Pt has no lt lower leg swelling,pain or redness; no CP or SOB. Last night pt had some pain around lt ankle but none this morning.pt is waiting on ortho referral but request cb with Dr Rometta Emery suggestion about [place]. ED precautions given and pt voiced understanding.

## 2019-03-13 NOTE — Telephone Encounter (Signed)
[facility] Night - Client TELEPHONE ADVICE RECORD [facility] Patient Name: Rebekah Johnston Gender: Female DOB: [birth year] Age: 68 Y 83 M 27 D Return Phone Number: [phone] (Primary) Address: City/State/Zip: [place] [address] Client [facility] Night - Client Client Site [place] Physician Eliezer Lofts - MD Contact Type Call Who Is Calling Patient / Member / Family / Caregiver Call Type Triage / Clinical Relationship To Patient Self Return Phone Number [phone] (Primary) Chief Complaint Knee Injury Reason for Call Symptomatic / Request for [name] states, wanting referral for knee to be looked at. Pt was in ED last night due to pain. She was referred to a dr already. She wants a stronger pain rx or advice since pain rx is not working. Translation No Nurse Assessment Guidelines Guideline Title Affirmed Question Affirmed Notes Nurse Date/Time (Eastern Time) Disp. Time [name] Time) Disposition Final User 03/12/2019 6:33:33 PM Send To RN Personal Pearletha Alfred, RN, Kaila 03/12/2019 6:44:38 PM Attempt made - message left Emch, RN, Ronalee Belts 03/12/2019 6:57:17 PM FINAL ATTEMPT MADE - message left Yes Emch, RN, Ronalee Belts

## 2019-03-13 NOTE — Telephone Encounter (Signed)
DVT sounds very unlikely given no leg swelling, no leg pain ( other than in knee joint). Also reviewed ED exam.. no concern for clot there.  If swelling in leg starts, calf pain, warm or redness in calf... make appt to be seen.  Can also offer virtual to discuss in person with me today.

## 2019-03-13 NOTE — Telephone Encounter (Signed)
error 

## 2019-03-13 NOTE — Telephone Encounter (Signed)
Pt scheduled today@1pm  w/PA [email or web address] [place].

## 2019-04-04 DIAGNOSIS — H43811 Vitreous degeneration, right eye: Secondary | ICD-10-CM | POA: Diagnosis not present

## 2019-05-28 ENCOUNTER — Telehealth: Payer: Self-pay | Admitting: *Deleted

## 2019-05-28 NOTE — Telephone Encounter (Signed)
Patient left a voicemail stating that she is taking a new job with the [employer]. Patent stated that she needs records of her immunizations. Patent stated that she needs to know if and when she had a Hep B, MMR, TB skin test, HBV vaccines. Patient stated that she needs some titers done for immunity.again certain diseases. Patient requested a call back to discuss what she needs.

## 2019-05-29 NOTE — Telephone Encounter (Signed)
Spoke with Ms. Rebekah Johnston.  I have printed out her immunization record from [facility] as well as [facility].  There is no documentation of MMR or TB skin test.  According to [facility] she has had 2 Hepatitis B vaccines and 1 Hep A.  I recommended that she contact [facility] where her Hep B vaccines were given to see if she did receive her 3rd Hep B vaccine and maybe they just didn't enter it in [place].  She states she will come by and pick up vaccine records. Her new job is actually with [employer] so she will give them her vaccine records and let them tell her what she needs to have done.    She is also due for her [place].  She ask if Shirlean Mylar could just give her a call to schedule.  Robin please try and schedule MWV with fasting labs prior just with Dr. Diona Browner.

## 2019-06-03 NOTE — Telephone Encounter (Signed)
Please call and schedule appointment as instructed and close encounter when completed.

## 2019-06-04 NOTE — Telephone Encounter (Signed)
Lab 2/19 cpx 2/23

## 2019-06-12 ENCOUNTER — Other Ambulatory Visit: Payer: Self-pay

## 2019-06-12 ENCOUNTER — Ambulatory Visit: Payer: PPO | Attending: Internal Medicine

## 2019-06-12 DIAGNOSIS — Z23 Encounter for immunization: Secondary | ICD-10-CM | POA: Insufficient documentation

## 2019-06-12 NOTE — Progress Notes (Signed)
   Covid-19 Vaccination Clinic  Name:  Rebekah Johnston    MRN: [id number] DOB: Jan 22, 1951  06/12/2019  [name] was observed post Covid-19 immunization for 15 minutes without incidence. She was provided with Vaccine Information Sheet and instruction to access the V-Safe system.   [name] was instructed to call 911 with any severe reactions post vaccine: Marland Kitchen Difficulty breathing  . Swelling of your face and throat  . A fast heartbeat  . A bad rash all over your body  . Dizziness and weakness    Immunizations Administered    Name Date Dose VIS Date Route   Pfizer COVID-19 Vaccine 06/12/2019 10:37 AM 0.3 mL 04/11/2019 Intramuscular   Manufacturer: [place]   Lot: [id number]   [facility]: [id number]

## 2019-06-18 ENCOUNTER — Telehealth: Payer: Self-pay

## 2019-06-18 NOTE — Telephone Encounter (Signed)
Opened in error

## 2019-06-19 ENCOUNTER — Telehealth: Payer: Self-pay | Admitting: Family Medicine

## 2019-06-19 DIAGNOSIS — E559 Vitamin D deficiency, unspecified: Secondary | ICD-10-CM

## 2019-06-19 DIAGNOSIS — E7800 Pure hypercholesterolemia, unspecified: Secondary | ICD-10-CM

## 2019-06-19 DIAGNOSIS — R7309 Other abnormal glucose: Secondary | ICD-10-CM

## 2019-06-19 NOTE — Telephone Encounter (Signed)
-----   Message from Cloyd Stagers, RT sent at 06/05/2019  2:41 PM EST ----- Regarding: Lab Orders for Friday 2.19.2021 Please place lab orders for Friday 2.19.2021, office visit for physical on Tuesday 2.23.2021 Thank you, Dyke Maes RT(R)

## 2019-06-20 ENCOUNTER — Other Ambulatory Visit: Payer: PPO

## 2019-06-23 ENCOUNTER — Other Ambulatory Visit (INDEPENDENT_AMBULATORY_CARE_PROVIDER_SITE_OTHER): Payer: PPO

## 2019-06-23 ENCOUNTER — Other Ambulatory Visit: Payer: Self-pay

## 2019-06-23 DIAGNOSIS — R7309 Other abnormal glucose: Secondary | ICD-10-CM | POA: Diagnosis not present

## 2019-06-23 DIAGNOSIS — R5383 Other fatigue: Secondary | ICD-10-CM

## 2019-06-23 DIAGNOSIS — E7800 Pure hypercholesterolemia, unspecified: Secondary | ICD-10-CM | POA: Diagnosis not present

## 2019-06-23 DIAGNOSIS — E559 Vitamin D deficiency, unspecified: Secondary | ICD-10-CM | POA: Diagnosis not present

## 2019-06-23 LAB — COMPREHENSIVE METABOLIC PANEL
ALT: 14 U/L (ref 0–35)
AST: 16 U/L (ref 0–37)
Albumin: 4.1 g/dL (ref 3.5–5.2)
Alkaline Phosphatase: 97 U/L (ref 39–117)
BUN: 13 mg/dL (ref 6–23)
CO2: 25 mEq/L (ref 19–32)
Calcium: 9.7 mg/dL (ref 8.4–10.5)
Chloride: 106 mEq/L (ref 96–112)
Creatinine, Ser: 1.28 mg/dL — ABNORMAL HIGH (ref 0.40–1.20)
GFR: 41.26 mL/min — ABNORMAL LOW (ref >60.00)
Glucose, Bld: 119 mg/dL — ABNORMAL HIGH (ref 70–99)
Potassium: 4.4 mEq/L (ref 3.5–5.1)
Sodium: 143 mEq/L (ref 135–145)
Total Bilirubin: 0.5 mg/dL (ref 0.2–1.2)
Total Protein: 6.5 g/dL (ref 6.0–8.3)

## 2019-06-23 LAB — LIPID PANEL
Cholesterol: 172 mg/dL (ref 0–200)
HDL: 57.7 mg/dL (ref >39.00)
LDL Cholesterol: 95 mg/dL (ref 0–99)
NonHDL: 114.02
Total CHOL/HDL Ratio: 3
Triglycerides: 96 mg/dL (ref 0.0–149.0)
VLDL: 19.2 mg/dL (ref 0.0–40.0)

## 2019-06-23 LAB — TSH: TSH: 3.69 u[IU]/mL (ref 0.35–4.50)

## 2019-06-23 LAB — T3, FREE: T3, Free: 3.2 pg/mL (ref 2.3–4.2)

## 2019-06-23 LAB — VITAMIN D 25 HYDROXY (VIT D DEFICIENCY, FRACTURES): VITD: 33.88 ng/mL (ref 30.00–100.00)

## 2019-06-23 LAB — T4, FREE: Free T4: 0.72 ng/dL (ref 0.60–1.60)

## 2019-06-23 LAB — HEMOGLOBIN A1C: Hgb A1c MFr Bld: 5.9 % (ref 4.6–6.5)

## 2019-06-24 ENCOUNTER — Ambulatory Visit (INDEPENDENT_AMBULATORY_CARE_PROVIDER_SITE_OTHER): Payer: PPO | Admitting: Family Medicine

## 2019-06-24 ENCOUNTER — Other Ambulatory Visit: Payer: Self-pay

## 2019-06-24 ENCOUNTER — Encounter: Payer: Self-pay | Admitting: Family Medicine

## 2019-06-24 VITALS — BP 120/84 | HR 61 | Temp 98.4°F | Ht 63.25 in | Wt 192.0 lb

## 2019-06-24 DIAGNOSIS — Z0000 Encounter for general adult medical examination without abnormal findings: Secondary | ICD-10-CM | POA: Diagnosis not present

## 2019-06-24 DIAGNOSIS — G4733 Obstructive sleep apnea (adult) (pediatric): Secondary | ICD-10-CM | POA: Diagnosis not present

## 2019-06-24 DIAGNOSIS — R7309 Other abnormal glucose: Secondary | ICD-10-CM | POA: Diagnosis not present

## 2019-06-24 DIAGNOSIS — E559 Vitamin D deficiency, unspecified: Secondary | ICD-10-CM | POA: Diagnosis not present

## 2019-06-24 DIAGNOSIS — N1832 Chronic kidney disease, stage 3b: Secondary | ICD-10-CM

## 2019-06-24 DIAGNOSIS — F331 Major depressive disorder, recurrent, moderate: Secondary | ICD-10-CM

## 2019-06-24 DIAGNOSIS — E7800 Pure hypercholesterolemia, unspecified: Secondary | ICD-10-CM | POA: Diagnosis not present

## 2019-06-24 LAB — MICROALBUMIN / CREATININE URINE RATIO
Creatinine,U: 118.3 mg/dL
Microalb Creat Ratio: 0.8 mg/g (ref 0.0–30.0)
Microalb, Ur: 0.9 mg/dL (ref 0.0–1.9)

## 2019-06-24 NOTE — Assessment & Plan Note (Addendum)
Stable control. Low carb diet.

## 2019-06-24 NOTE — Assessment & Plan Note (Signed)
Well controlled. Continue current medication.  

## 2019-06-24 NOTE — Addendum Note (Signed)
Addended by: Cloyd Stagers on: 06/24/2019 01:01 PM   Modules accepted: Orders

## 2019-06-24 NOTE — Assessment & Plan Note (Signed)
Good control on atorvastatin.

## 2019-06-24 NOTE — Addendum Note (Signed)
Addended by: Eliezer Lofts E on: 06/24/2019 12:45 PM   Modules accepted: Orders

## 2019-06-24 NOTE — Patient Instructions (Addendum)
Work on increasing walking.. to 3-5 days a week.  Set  Up mammogram on your own.

## 2019-06-24 NOTE — Assessment & Plan Note (Signed)
Stable control, keep up with fluids.

## 2019-06-24 NOTE — Assessment & Plan Note (Signed)
Improved with supplement.

## 2019-06-24 NOTE — Progress Notes (Signed)
Chief Complaint  Patient presents with  . Medicare Wellness    History of Present Illness: HPI  The patient presents for annual medicare wellness, complete physical and review of chronic health problems. He/She also has the following acute concerns today:  Elevated Cholesterol:  Good control on atorvastatin. Lab Results  Component Value Date   CHOL 172 06/23/2019   HDL 57.70 06/23/2019   LDLCALC 95 06/23/2019   LDLDIRECT 179.3 06/20/2013   TRIG 96.0 06/23/2019   CHOLHDL 3 06/23/2019  Using medications without problems: Muscle aches:  Diet compliance: healthy Exercise: walking Other complaints: Prediabetes  Lab Results  Component Value Date   HGBA1C 5.9 06/23/2019     Wt Readings from Last 3 Encounters:  06/24/19 192 lb (87.1 kg)  11/26/18 195 lb 12 oz (88.8 kg)  07/09/18 199 lb (90.3 kg)    Occ swelling in ankles when sitting a lot. Improved with elevation.  No CP, no SOB.  MDD: Good control on celexa. PHQ9: 2  She has gone back to work and is feeling better.   CKD:  Stable GFR.   VIT d in nml range.  Thyroid normal.  This visit occurred during the SARS-CoV-2 public health emergency.  Safety protocols were in place, including screening questions prior to the visit, additional usage of staff PPE, and extensive cleaning of exam room while observing appropriate contact time as indicated for disinfecting solutions.   COVID 19 screen:  No recent travel or known exposure to COVID19 The patient denies respiratory symptoms of COVID 19 at this time. The importance of social distancing was discussed today.     Review of Systems  Constitutional: Negative for chills and fever.  HENT: Negative for congestion and ear pain.   Eyes: Negative for pain and redness.  Respiratory: Negative for cough and shortness of breath.   Cardiovascular: Negative for chest pain, palpitations and leg swelling.  Gastrointestinal: Negative for abdominal pain, blood in stool, constipation,  diarrhea, nausea and vomiting.  Genitourinary: Negative for dysuria.  Musculoskeletal: Negative for falls and myalgias.  Skin: Negative for rash.  Neurological: Negative for dizziness.  Psychiatric/Behavioral: Negative for depression. The patient is not nervous/anxious.       Past Medical History:  Diagnosis Date  . Asthma    no inhalers  . Bronchitis    taking meds now  . Chronic kidney disease 2008   Stage G3b/A1  . Depression   . GERD (gastroesophageal reflux disease)   . Glaucoma    no eye drops  . Hyperlipidemia    no meds  . Macular degeneration    Bil  . Osteoarthritis     reports that she quit smoking about 18 years ago. She has never used smokeless tobacco. She reports current alcohol use of about 1.0 standard drinks of alcohol per week. She reports that she does not use drugs.   Current Outpatient Medications:  .  acetaminophen (TYLENOL) 325 MG tablet, Take 650 mg by mouth every 6 (six) hours as needed., Disp: , Rfl:  .  albuterol (PROVENTIL HFA;VENTOLIN HFA) 108 (90 Base) MCG/ACT inhaler, Inhale 1-2 puffs into the lungs every 6 (six) hours as needed for wheezing or shortness of breath., Disp: 1 Inhaler, Rfl: 0 .  atorvastatin (LIPITOR) 10 MG tablet, Take 1 tablet (10 mg total) by mouth daily., Disp: 90 tablet, Rfl: 3 .  Cholecalciferol (VITAMIN D3) 2000 units TABS, Take by mouth daily., Disp: , Rfl:  .  citalopram (CELEXA) 40 MG tablet, TAKE 1 TABLET  BY MOUTH EVERY DAY, Disp: 90 tablet, Rfl: 1 .  fluticasone (FLONASE) 50 MCG/ACT nasal spray, Place 2 sprays into both nostrils as needed. , Disp: , Rfl:  .  pantoprazole (PROTONIX) 40 MG tablet, Take 40 mg by mouth daily., Disp: , Rfl: 11 .  Spacer/Aero-Holding Chambers (AEROCHAMBER PLUS) inhaler, Use as instructed, Disp: 1 each, Rfl: 2   Observations/Objective: Blood pressure 120/84, pulse 61, temperature 98.4 F (36.9 C), temperature source Temporal, height 5' 3.25" (1.607 m), weight 192 lb (87.1 kg), SpO2 98  %.  Physical Exam Constitutional:      General: She is not in acute distress.    Appearance: Normal appearance. She is well-developed. She is obese. She is not ill-appearing or toxic-appearing.  HENT:     Head: Normocephalic.     Right Ear: Hearing, tympanic membrane, ear canal and external ear normal.     Left Ear: Hearing, tympanic membrane, ear canal and external ear normal.     Nose: Nose normal.  Eyes:     General: Lids are normal. Lids are everted, no foreign bodies appreciated.     Conjunctiva/sclera: Conjunctivae normal.     Pupils: Pupils are equal, round, and reactive to light.  Neck:     Thyroid: No thyroid mass or thyromegaly.     Vascular: No carotid bruit.     Trachea: Trachea normal.  Cardiovascular:     Rate and Rhythm: Normal rate and regular rhythm.     Heart sounds: Normal heart sounds, S1 normal and S2 normal. No murmur. No gallop.   Pulmonary:     Effort: Pulmonary effort is normal. No respiratory distress.     Breath sounds: Normal breath sounds. No wheezing, rhonchi or rales.  Abdominal:     General: Bowel sounds are normal. There is no distension or abdominal bruit.     Palpations: Abdomen is soft. There is no fluid wave or mass.     Tenderness: There is no abdominal tenderness. There is no guarding or rebound.     Hernia: No hernia is present.  Musculoskeletal:     Cervical back: Normal range of motion and neck supple.  Lymphadenopathy:     Cervical: No cervical adenopathy.  Skin:    General: Skin is warm and dry.     Findings: No rash.  Neurological:     Mental Status: She is alert.     Cranial Nerves: No cranial nerve deficit.     Sensory: No sensory deficit.  Psychiatric:        Mood and Affect: Mood is not anxious or depressed.        Speech: Speech normal.        Behavior: Behavior normal. Behavior is cooperative.        Judgment: Judgment normal.      Assessment and Plan The patient's preventative maintenance and recommended screening  tests for an annual wellness exam were reviewed in full today. Brought up to date unless services declined.  Counselled on the importance of diet, exercise, and its role in overall health and mortality. The patient's FH and SH was reviewed, including their home life, tobacco status, and drug and alcohol status.   Vaccines:uptodate, Pap/DVE:nml pap, neg HPV 2015, no further indicated, Continue DVE 1-2 years. Mammo1/15/2018.Marland Kitchen Plan q 2 years Bone Densityosteopenia 05/2016, repeat in 5 years Colon:06/2016, Dr. Fuller Plan... Repeat in 5 years. Smoking Status:nonsmoker Hep C:done   HYPERCHOLESTEROLEMIA Good control on atorvastatin.  PREDIABETES  Stable control. Low carb diet.  Chronic  kidney disease (CKD) stage G3b/A1, moderately decreased glomerular filtration rate (GFR) between 30-44 mL/min/1.73 square meter and albuminuria creatinine ratio less than 30 mg/g  Stable control, keep up with fluids.  Major depressive disorder, recurrent episode, moderate (HCC) Well controlled. Continue current medication.   Vitamin D deficiency Improved with supplement.     Eliezer Lofts, MD

## 2019-06-25 ENCOUNTER — Encounter: Payer: Self-pay | Admitting: *Deleted

## 2019-07-22 ENCOUNTER — Ambulatory Visit: Payer: PPO | Attending: Internal Medicine

## 2019-07-22 DIAGNOSIS — Z23 Encounter for immunization: Secondary | ICD-10-CM

## 2019-07-22 NOTE — Progress Notes (Signed)
   Covid-19 Vaccination Clinic  Name:  Rebekah Johnston    MRN: [id number] DOB: 1950/05/24  07/22/2019  Rebekah Johnston was observed post Covid-19 immunization for 15 minutes without incident. She was provided with Vaccine Information Sheet and instruction to access the V-Safe system.   Rebekah Johnston was instructed to call 911 with any severe reactions post vaccine: Marland Kitchen Difficulty breathing  . Swelling of face and throat  . A fast heartbeat  . A bad rash all over body  . Dizziness and weakness   Immunizations Administered    Name Date Dose VIS Date Route   Pfizer COVID-19 Vaccine 07/22/2019  9:25 AM 0.3 mL 04/11/2019 Intramuscular   Manufacturer: [place]   Lot: [id number]   [place]: [id number]

## 2019-08-07 ENCOUNTER — Other Ambulatory Visit: Payer: Self-pay | Admitting: Family Medicine

## 2019-08-19 ENCOUNTER — Ambulatory Visit: Payer: PPO | Admitting: Pulmonary Disease

## 2019-08-19 ENCOUNTER — Other Ambulatory Visit: Payer: Self-pay

## 2019-08-19 ENCOUNTER — Encounter: Payer: Self-pay | Admitting: Pulmonary Disease

## 2019-08-19 VITALS — BP 118/82 | HR 59 | Temp 98.0°F | Ht 63.5 in | Wt 195.0 lb

## 2019-08-19 DIAGNOSIS — G4733 Obstructive sleep apnea (adult) (pediatric): Secondary | ICD-10-CM | POA: Diagnosis not present

## 2019-08-19 NOTE — Patient Instructions (Signed)
Moderate probability of significant obstructive sleep apnea  We will obtain a home sleep study Treatment options as we discussed  We will update you with results as soon as available  We will follow-up in 2 to 3 months Sleep Apnea Sleep apnea is a condition in which breathing pauses or becomes shallow during sleep. Episodes of sleep apnea usually last 10 seconds or longer, and they may occur as many as 20 times an hour. Sleep apnea disrupts your sleep and keeps your body from getting the rest that it needs. This condition can increase your risk of certain health problems, including:  Heart attack.  Stroke.  Obesity.  Diabetes.  Heart failure.  Irregular heartbeat. What are the causes? There are three kinds of sleep apnea:  Obstructive sleep apnea. This kind is caused by a blocked or collapsed airway.  Central sleep apnea. This kind happens when the part of the brain that controls breathing does not send the correct signals to the muscles that control breathing.  Mixed sleep apnea. This is a combination of obstructive and central sleep apnea. The most common cause of this condition is a collapsed or blocked airway. An airway can collapse or become blocked if:  Your throat muscles are abnormally relaxed.  Your tongue and tonsils are larger than normal.  You are overweight.  Your airway is smaller than normal. What increases the risk? You are more likely to develop this condition if you:  Are overweight.  Smoke.  Have a smaller than normal airway.  Are elderly.  Are female.  Drink alcohol.  Take sedatives or tranquilizers.  Have a family history of sleep apnea. What are the signs or symptoms? Symptoms of this condition include:  Trouble staying asleep.  Daytime sleepiness and tiredness.  Irritability.  Loud snoring.  Morning headaches.  Trouble concentrating.  Forgetfulness.  Decreased interest in sex.  Unexplained sleepiness.  Mood  swings.  Personality changes.  Feelings of depression.  Waking up often during the night to urinate.  Dry mouth.  Sore throat. How is this diagnosed? This condition may be diagnosed with:  A medical history.  A physical exam.  A series of tests that are done while you are sleeping (sleep study). These tests are usually done in a sleep lab, but they may also be done at home. How is this treated? Treatment for this condition aims to restore normal breathing and to ease symptoms during sleep. It may involve managing health issues that can affect breathing, such as high blood pressure or obesity. Treatment may include:  Sleeping on your side.  Using a decongestant if you have nasal congestion.  Avoiding the use of depressants, including alcohol, sedatives, and narcotics.  Losing weight if you are overweight.  Making changes to your diet.  Quitting smoking.  Using a device to open your airway while you sleep, such as: ? An oral appliance. This is a custom-made mouthpiece that shifts your lower jaw forward. ? A continuous positive airway pressure (CPAP) device. This device blows air through a mask when you breathe out (exhale). ? A nasal expiratory positive airway pressure (EPAP) device. This device has valves that you put into each nostril. ? A bi-level positive airway pressure (BPAP) device. This device blows air through a mask when you breathe in (inhale) and breathe out (exhale).  Having surgery if other treatments do not work. During surgery, excess tissue is removed to create a wider airway. It is important to get treatment for sleep apnea. Without treatment,  this condition can lead to:  High blood pressure.  Coronary artery disease.  In men, an inability to achieve or maintain an erection (impotence).  Reduced thinking abilities. Follow these instructions at home: Lifestyle  Make any lifestyle changes that your health care provider recommends.  Eat a healthy,  well-balanced diet.  Take steps to lose weight if you are overweight.  Avoid using depressants, including alcohol, sedatives, and narcotics.  Do not use any products that contain nicotine or tobacco, such as cigarettes, e-cigarettes, and chewing tobacco. If you need help quitting, ask your health care provider. General instructions  Take over-the-counter and prescription medicines only as told by your health care provider.  If you were given a device to open your airway while you sleep, use it only as told by your health care provider.  If you are having surgery, make sure to tell your health care provider you have sleep apnea. You may need to bring your device with you.  Keep all follow-up visits as told by your health care provider. This is important. Contact a health care provider if:  The device that you received to open your airway during sleep is uncomfortable or does not seem to be working.  Your symptoms do not improve.  Your symptoms get worse. Get help right away if:  You develop: ? Chest pain. ? Shortness of breath. ? Discomfort in your back, arms, or stomach.  You have: ? Trouble speaking. ? Weakness on one side of your body. ? Drooping in your face. These symptoms may represent a serious problem that is an emergency. Do not wait to see if the symptoms will go away. Get medical help right away. Call your local emergency services (911 in the [place] Do not drive yourself to the hospital. Summary  Sleep apnea is a condition in which breathing pauses or becomes shallow during sleep.  The most common cause is a collapsed or blocked airway.  The goal of treatment is to restore normal breathing and to ease symptoms during sleep. This information is not intended to replace advice given to you by your health care provider. Make sure you discuss any questions you have with your health care provider. Document Revised: 10/02/2018 Document Reviewed: 12/11/2017 Elsevier  Patient Education  Rebekah Johnston.

## 2019-08-19 NOTE — Progress Notes (Signed)
Rebekah Johnston    [id number]    1950/09/27  Primary Care Physician:Bedsole, Mervyn Gay, MD  Referring Physician: Jinny Sanders, MD [address] [place],  [place] [address]  Chief complaint:   History of snoring  HPI:  Snoring, nonrestorative sleep  Usually goes to bed between 9 and 11 Takes about 30 minutes to fall asleep about 3 awakenings Final wake up time about 6:30 in the morning  Dad snored  Occasional dryness of her mouth No headaches No night sweats Memory is fair  Outpatient Encounter Medications as of 08/19/2019  Medication Sig  . acetaminophen (TYLENOL) 325 MG tablet Take 650 mg by mouth every 6 (six) hours as needed.  Marland Kitchen albuterol (PROVENTIL HFA;VENTOLIN HFA) 108 (90 Base) MCG/ACT inhaler Inhale 1-2 puffs into the lungs every 6 (six) hours as needed for wheezing or shortness of breath.  Marland Kitchen atorvastatin (LIPITOR) 10 MG tablet Take 1 tablet (10 mg total) by mouth daily.  . Cholecalciferol (VITAMIN D3) 2000 units TABS Take by mouth daily.  . citalopram (CELEXA) 40 MG tablet TAKE 1 TABLET BY MOUTH EVERY DAY  . fluticasone (FLONASE) 50 MCG/ACT nasal spray Place 2 sprays into both nostrils as needed.   . pantoprazole (PROTONIX) 40 MG tablet Take 40 mg by mouth daily.  Marland Kitchen Spacer/Aero-Holding Chambers (AEROCHAMBER PLUS) inhaler Use as instructed   No facility-administered encounter medications on file as of 08/19/2019.    Allergies as of 08/19/2019  . (No Known Allergies)    Past Medical History:  Diagnosis Date  . Asthma    no inhalers  . Bronchitis    taking meds now  . Chronic kidney disease 2008   Stage G3b/A1  . Depression   . GERD (gastroesophageal reflux disease)   . Glaucoma    no eye drops  . Hyperlipidemia    no meds  . Macular degeneration    Bil  . Osteoarthritis     Past Surgical History:  Procedure Laterality Date  . BUNIONECTOMY     rt. foot  . CHOLECYSTECTOMY    . HERNIA REPAIR     left inguinal hernia  .  TONSILLECTOMY  1967    Family History  Problem Relation Age of Onset  . Prostate cancer Father   . Dementia Mother   . Diabetes Brother   . Melanoma Other        Uncle  . Colon cancer Neg Hx   . Esophageal cancer Neg Hx   . Rectal cancer Neg Hx   . Stomach cancer Neg Hx     Social History   Socioeconomic History  . Marital status: Divorced    Spouse name: Not on file  . Number of children: 3  . Years of education: Not on file  . Highest education level: Not on file  Occupational History  . Occupation: Academic librarian: UNEMPLOYED  Tobacco Use  . Smoking status: Former Smoker    Packs/day: 0.25    Years: 3.00    Pack years: 0.75    Types: Cigarettes    Quit date: 05/23/2001    Years since quitting: 18.2  . Smokeless tobacco: Never Used  . Tobacco comment: smoked socially not everyday thing.  Substance and Sexual Activity  . Alcohol use: Yes    Alcohol/week: 1.0 standard drinks    Types: 1 Glasses of wine per week    Comment: occasional once a month  . Drug use: No  .  Sexual activity: Not on file  Other Topics Concern  . Not on file  Social History Narrative   Daily caffeine   Social Determinants of Health   Financial Resource Strain:   . Difficulty of Paying Living Expenses:   Food Insecurity:   . Worried About Charity fundraiser in the Last Year:   . Arboriculturist in the Last Year:   Transportation Needs:   . Film/video editor (Medical):   Marland Kitchen Lack of Transportation (Non-Medical):   Physical Activity:   . Days of Exercise per Week:   . Minutes of Exercise per Session:   Stress:   . Feeling of Stress :   Social Connections:   . Frequency of Communication with Friends and Family:   . Frequency of Social Gatherings with Friends and Family:   . Attends Religious Services:   . Active Member of Clubs or Organizations:   . Attends Archivist Meetings:   Marland Kitchen Marital Status:   Intimate Partner Violence:   . Fear of Current or Ex-Partner:     . Emotionally Abused:   Marland Kitchen Physically Abused:   . Sexually Abused:     Review of Systems  Respiratory: Negative.  Negative for shortness of breath.   Psychiatric/Behavioral: Positive for sleep disturbance.    Vitals:   08/19/19 1604  BP: 118/82  Pulse: (!) 59  Temp: 98 F (36.7 C)  SpO2: 97%     Physical Exam  Constitutional: She appears well-developed and well-nourished.  HENT:  Head: Normocephalic and atraumatic.  Mallampati 3, crowded oropharynx  Eyes: Right eye exhibits no discharge.  Neck: No tracheal deviation present. No thyromegaly present.  Cardiovascular: Normal rate and regular rhythm.  Pulmonary/Chest: Effort normal and breath sounds normal. No respiratory distress. She has no wheezes. She has no rales. She exhibits no tenderness.  Musculoskeletal:        General: Normal range of motion.     Cervical back: Normal range of motion and neck supple.  Neurological: She is alert. No cranial nerve deficit.  Skin: Skin is warm.  Psychiatric: She has a normal mood and affect.    Results of the Epworth flowsheet 08/19/2019  Sitting and reading 1  Watching TV 1  Sitting, inactive in a public place (e.g. a theatre or a meeting) 1  As a passenger in a car for an hour without a break 2  Lying down to rest in the afternoon when circumstances permit 3  Sitting and talking to someone 1  Sitting quietly after a lunch without alcohol 1  In a car, while stopped for a few minutes in traffic 1  Total score 11   Assessment:  Snoring  Excessive daytime sleepiness  Pathophysiology of sleep disordered breathing discussed with patient Treatment options for sleep disordered breathing discussed with patient  Plan/Recommendations: We will schedule patient for home sleep study  Sequelae of untreated sleep disordered breathing discussed  We will follow-up in 3 months  Encouraged to call with significant concerns   Sherrilyn Rist MD [facility] 08/19/2019, 7:49 PM  CC: Jinny Sanders, MD

## 2019-08-27 DIAGNOSIS — Z20828 Contact with and (suspected) exposure to other viral communicable diseases: Secondary | ICD-10-CM | POA: Diagnosis not present

## 2019-08-27 DIAGNOSIS — Z20822 Contact with and (suspected) exposure to covid-19: Secondary | ICD-10-CM | POA: Diagnosis not present

## 2019-08-27 DIAGNOSIS — Z03818 Encounter for observation for suspected exposure to other biological agents ruled out: Secondary | ICD-10-CM | POA: Diagnosis not present

## 2019-08-29 ENCOUNTER — Ambulatory Visit: Payer: PPO

## 2019-08-29 ENCOUNTER — Other Ambulatory Visit: Payer: Self-pay

## 2019-08-29 DIAGNOSIS — G4733 Obstructive sleep apnea (adult) (pediatric): Secondary | ICD-10-CM

## 2019-09-02 ENCOUNTER — Telehealth: Payer: Self-pay | Admitting: Pulmonary Disease

## 2019-09-02 DIAGNOSIS — G4733 Obstructive sleep apnea (adult) (pediatric): Secondary | ICD-10-CM | POA: Diagnosis not present

## 2019-09-02 NOTE — Telephone Encounter (Signed)
Call patient   Sleep study result  Date of study:5/1 and 08/31/2019   Impression: Severe obstructive sleep apnea Moderately severe oxygen desaturations  Recommendation: Recommend CPAP therapy for severe obstructive sleep apnea Auto titrating CPAP with pressure settings of 5-20 will be appropriate Close clinical follow-up with compliance monitoring to optimize therapeutic efficiency Encourage weight loss measures

## 2019-09-03 NOTE — Telephone Encounter (Signed)
Dr. Ander Slade has reviewed the home sleep test this showed:  Severe obstructive sleep apnea  Moderately severe oxygen desaturations  Recommendations   Recommend CPAP therapy for severe obstructive sleep apnea.  Treatment options are CPAP with the settings auto titrate 5-20 cm H2O.  Close clinical follow up with compliance monitoring to optimize therapeutic efficiency.    Weight loss measures encouraged.   Advise against driving while sleepy & against medication with sedative side effects.    Make appointment for 3 months for compliance with download with Dr. Ander Slade.    Patient called on 09/03/19, identification verified, home sleep study results reviewed. Patient is willing to start CPAP therapy, orders placed, follow up recall placed to see Dr. Ander Slade in three months.

## 2019-09-18 ENCOUNTER — Telehealth: Payer: Self-pay | Admitting: Pulmonary Disease

## 2019-09-18 NOTE — Telephone Encounter (Addendum)
[facility] is in-network with [employer].  I will send order to them.  Called pt & left her vm letting her know order is being sent to [facility].  Gave her my phone # & their phone # in case she needs to call.  Nothing further needed.

## 2019-10-02 DIAGNOSIS — G4733 Obstructive sleep apnea (adult) (pediatric): Secondary | ICD-10-CM | POA: Diagnosis not present

## 2019-11-01 DIAGNOSIS — G4733 Obstructive sleep apnea (adult) (pediatric): Secondary | ICD-10-CM | POA: Diagnosis not present

## 2019-11-22 ENCOUNTER — Other Ambulatory Visit: Payer: Self-pay | Admitting: Family Medicine

## 2019-11-24 DIAGNOSIS — L578 Other skin changes due to chronic exposure to nonionizing radiation: Secondary | ICD-10-CM | POA: Diagnosis not present

## 2019-11-24 DIAGNOSIS — L821 Other seborrheic keratosis: Secondary | ICD-10-CM | POA: Diagnosis not present

## 2019-11-24 DIAGNOSIS — L281 Prurigo nodularis: Secondary | ICD-10-CM | POA: Diagnosis not present

## 2019-11-24 DIAGNOSIS — Z872 Personal history of diseases of the skin and subcutaneous tissue: Secondary | ICD-10-CM | POA: Diagnosis not present

## 2019-11-27 DIAGNOSIS — G4733 Obstructive sleep apnea (adult) (pediatric): Secondary | ICD-10-CM | POA: Diagnosis not present

## 2019-12-02 DIAGNOSIS — G4733 Obstructive sleep apnea (adult) (pediatric): Secondary | ICD-10-CM | POA: Diagnosis not present

## 2020-01-09 ENCOUNTER — Other Ambulatory Visit: Payer: Self-pay | Admitting: Family Medicine

## 2020-01-23 ENCOUNTER — Ambulatory Visit
Admission: EM | Admit: 2020-01-23 | Discharge: 2020-01-23 | Disposition: A | Discharge status: Home / Self Care | Payer: PPO | Attending: Physician Assistant | Admitting: Physician Assistant

## 2020-01-23 ENCOUNTER — Other Ambulatory Visit: Payer: Self-pay

## 2020-01-23 ENCOUNTER — Encounter: Payer: Self-pay | Admitting: Emergency Medicine

## 2020-01-23 DIAGNOSIS — M545 Low back pain, unspecified: Secondary | ICD-10-CM

## 2020-01-23 DIAGNOSIS — N390 Urinary tract infection, site not specified: Secondary | ICD-10-CM | POA: Insufficient documentation

## 2020-01-23 LAB — URINALYSIS, COMPLETE (UACMP) WITH MICROSCOPIC
Bilirubin Urine: NEGATIVE
Glucose, UA: NEGATIVE mg/dL
Ketones, ur: NEGATIVE mg/dL
Nitrite: POSITIVE — AB
Protein, ur: NEGATIVE mg/dL
Specific Gravity, Urine: 1.025 (ref 1.005–1.030)
pH: 5.5 (ref 5.0–8.0)

## 2020-01-23 MED ORDER — SULFAMETHOXAZOLE-TRIMETHOPRIM 800-160 MG PO TABS: 1.0000 | ORAL_TABLET | Freq: Two times a day (BID) | ORAL | 0 refills | Status: AC

## 2020-01-23 MED ORDER — HYDROCODONE-ACETAMINOPHEN 5-325 MG PO TABS: 2.0000 | ORAL_TABLET | Freq: Four times a day (QID) | ORAL | 0 refills | Status: AC | PRN

## 2020-01-23 NOTE — ED Provider Notes (Signed)
[facility]    CSN: [id number] Arrival date & time: 01/23/20  0947      History   Chief Complaint Chief Complaint  Patient presents with  . Back Pain    HPI Rebekah Johnston is a 69 y.o. female with history of stage III chronic kidney disease, GERD, and osteoarthritis presents for complaints of lower back pain since yesterday.  She denies any falls or new injury.  She admits to history of chronic back pain flareups.  Patient says she knows she has 2 degenerative discs that sometimes cause her issues.  Patient says the back pain radiates across the lower back and sometimes to the left side of the abdomen.  She says pain is aching and sometimes sharp.  She denies any associated numbness or tingling.  Denies any radiation of pain down the legs.  Denies any weakness of the legs.  Denies any dysuria, hematuria, urinary frequency urgency or vaginal discharge.  She says she has taken Tylenol without relief.  She denies any saddle anesthesia or loss of bowel or bladder control.  No other concerns today.  HPI  Past Medical History:  Diagnosis Date  . Asthma    no inhalers  . Bronchitis    taking meds now  . Chronic kidney disease 2008   Stage G3b/A1  . Depression   . GERD (gastroesophageal reflux disease)   . Glaucoma    no eye drops  . Hyperlipidemia    no meds  . Macular degeneration    Bil  . Osteoarthritis     Patient Active Problem List   Diagnosis Date Noted  . Cough 07/09/2018  . ETD (Eustachian tube dysfunction), left 07/09/2018  . Vitamin D deficiency 05/10/2017  . Medicare annual wellness visit, subsequent 04/17/2017  . Trochanteric bursitis of left hip 03/09/2017  . Osteopenia 05/16/2016  . Major depressive disorder, recurrent episode, moderate ([place]) 04/04/2016  . Gastroesophageal reflux disease 04/04/2016  . Low back pain 01/18/2016  . Bilateral chronic knee pain 01/18/2016  . Counseling regarding end of life decision making 03/18/2015  . Chronic  kidney disease (CKD) stage G3b/A1, moderately decreased glomerular filtration rate (GFR) between 30-44 mL/min/1.73 square meter and albuminuria creatinine ratio less than 30 mg/g 08/20/2008  . HYPERCHOLESTEROLEMIA 05/20/2008  . ALLERGIC RHINITIS 04/16/2008  . EXERCISE INDUCED ASTHMA 04/16/2008  . OSTEOARTHRITIS 04/16/2008  . PREDIABETES 04/16/2008  . UMBILICAL HERNIORRHAPHY, HX OF 04/16/2008    Past Surgical History:  Procedure Laterality Date  . BUNIONECTOMY     rt. foot  . CHOLECYSTECTOMY    . HERNIA REPAIR     left inguinal hernia  . TONSILLECTOMY  1967    OB History   No obstetric history on file.      Home Medications    Prior to Admission medications   Medication Sig Start Date End Date Taking? Authorizing Provider  acetaminophen (TYLENOL) 325 MG tablet Take 650 mg by mouth every 6 (six) hours as needed.   Yes [provider]  albuterol (PROVENTIL HFA;VENTOLIN HFA) 108 (90 Base) MCG/ACT inhaler Inhale 1-2 puffs into the lungs every 6 (six) hours as needed for wheezing or shortness of breath. 06/20/18  Yes Melynda Ripple, MD  Cholecalciferol (VITAMIN D3) 2000 units TABS Take by mouth daily.   Yes [provider]  citalopram (CELEXA) 40 MG tablet TAKE 1 TABLET BY MOUTH EVERY DAY 01/09/20  Yes Bedsole, Amy E, MD  fluticasone (FLONASE) 50 MCG/ACT nasal spray Place 2 sprays into both nostrils as  needed.    Yes [provider]  pantoprazole (PROTONIX) 40 MG tablet Take 40 mg by mouth daily. 02/16/15  Yes [provider]  atorvastatin (LIPITOR) 10 MG tablet TAKE 1 TABLET BY MOUTH EVERY DAY 11/24/19   Bedsole, Amy E, MD  HYDROcodone-acetaminophen (NORCO/VICODIN) 5-325 MG tablet Take 2 tablets by mouth every 6 (six) hours as needed for up to 8 days. 01/23/20 01/31/20  Danton Clap, PA-C  Spacer/Aero-Holding Chambers (AEROCHAMBER PLUS) inhaler Use as instructed 06/20/18   Melynda Ripple, MD  sulfamethoxazole-trimethoprim (BACTRIM DS) 800-160 MG  tablet Take 1 tablet by mouth 2 (two) times daily for 7 days. 01/23/20 01/30/20  Danton Clap, PA-C    Family History Family History  Problem Relation Age of Onset  . Prostate cancer Father   . Dementia Mother   . Diabetes Brother   . Melanoma Other        Uncle  . Colon cancer Neg Hx   . Esophageal cancer Neg Hx   . Rectal cancer Neg Hx   . Stomach cancer Neg Hx     Social History Social History   Tobacco Use  . Smoking status: Former Smoker    Packs/day: 0.25    Years: 3.00    Pack years: 0.75    Types: Cigarettes    Quit date: 05/23/2001    Years since quitting: 18.6  . Smokeless tobacco: Never Used  . Tobacco comment: smoked socially not everyday thing.  Vaping Use  . Vaping Use: Never used  Substance Use Topics  . Alcohol use: Yes    Alcohol/week: 1.0 standard drink    Types: 1 Glasses of wine per week    Comment: occasional once a month  . Drug use: No     Allergies   Patient has no known allergies.   Review of Systems Review of Systems  Constitutional: Negative for fatigue and fever.  Respiratory: Negative for cough.   Cardiovascular: Negative for chest pain.  Gastrointestinal: Negative for abdominal pain, nausea and vomiting.  Genitourinary: Negative for dysuria, flank pain, frequency, hematuria, urgency and vaginal discharge.  Musculoskeletal: Positive for back pain. Negative for arthralgias, gait problem and myalgias.  Skin: Negative for rash.  Neurological: Negative for weakness and numbness.     Physical Exam Triage Vital Signs ED Triage Vitals  Enc Vitals Group     BP 01/23/20 0959 (!) 143/73     Pulse Rate 01/23/20 0959 63     Resp 01/23/20 0959 14     Temp 01/23/20 0959 98.4 F (36.9 C)     Temp Source 01/23/20 0959 Oral     SpO2 01/23/20 0959 97 %     Weight 01/23/20 0955 200 lb (90.7 kg)     Height 01/23/20 0955 5\' 4"  (1.626 m)     Head Circumference --      Peak Flow --      Pain Score 01/23/20 0955 9     Pain Loc --       Pain Edu? --      Excl. in [place]? --    No data found.  Updated Vital Signs BP (!) 143/73 (BP Location: Right Arm)   Pulse 63   Temp 98.4 F (36.9 C) (Oral)   Resp 14   Ht 5\' 4"  (1.626 m)   Wt 200 lb (90.7 kg)   SpO2 97%   BMI 34.33 kg/m    Physical Exam Vitals and nursing note reviewed.  Constitutional:  General: She is not in acute distress.    Appearance: Normal appearance. She is not ill-appearing or toxic-appearing.  HENT:     Head: Normocephalic and atraumatic.  Eyes:     General: No scleral icterus.       Right eye: No discharge.        Left eye: No discharge.     Conjunctiva/sclera: Conjunctivae normal.  Cardiovascular:     Rate and Rhythm: Normal rate and regular rhythm.     Heart sounds: Normal heart sounds.  Pulmonary:     Effort: Pulmonary effort is normal. No respiratory distress.     Breath sounds: Normal breath sounds.  Musculoskeletal:     Cervical back: Neck supple.     Lumbar back: No spasms, tenderness or bony tenderness. Decreased range of motion (greatly reduced forward flexion, mildly reduced extension. Normal rotation). Negative right straight leg raise test and negative left straight leg raise test.     Comments: 5 out of 5 strength bilateral lower extremities  Skin:    General: Skin is dry.  Neurological:     General: No focal deficit present.     Mental Status: She is alert. Mental status is at baseline.     Motor: No weakness.     Gait: Gait normal.  Psychiatric:        Mood and Affect: Mood normal.        Behavior: Behavior normal.        Thought Content: Thought content normal.      UC Treatments / Results  Labs (all labs ordered are listed, but only abnormal results are displayed) Labs Reviewed  URINALYSIS, COMPLETE (UACMP) WITH MICROSCOPIC - Abnormal; Notable for the following components:      Result Value   APPearance HAZY (*)    Hgb urine dipstick TRACE (*)    Nitrite POSITIVE (*)    Leukocytes,Ua TRACE (*)     Bacteria, UA MANY (*)    All other components within normal limits  URINE CULTURE    EKG   Radiology No results found.  Procedures Procedures (including critical care time)  Medications Ordered in UC Medications - No data to display  Initial Impression / Assessment and Plan / UC Course  I have reviewed the triage vital signs and the nursing notes.  Pertinent labs & imaging results that were available during my care of the patient were reviewed by me and considered in my medical decision making (see chart for details).   69 year old female with history of degenerative disc disease presenting for acute on chronic back pain since yesterday.  Pain is worse with flexion and extension.  She does not have any tenderness of the back on exam.  UA shows positive nitrites, trace leukocytes, and trace hemoglobin.  Urine sent for culture.  Treating patient at this time for urinary tract infection.  Her back pain does seem more musculoskeletal, however.  Patient states she has chronic kidney disease and does not take NSAIDs.  Tylenol has not helped.  She says she is taken hydrocodone in the past for really bad back pain and does well with that.  I have given her a short supply of hydrocodone to take as needed for severe pain.  Advised her to follow-up if she develops any new or worsening symptoms such as fever, blood in the urine, severe back pain, loss of bowel or bladder control, saddle anesthesia, leg weakness.  Patient agreeable.  Final Clinical Impressions(s) / UC Diagnoses  Final diagnoses:  Acute bilateral low back pain without sciatica  Lower urinary tract infectious disease     Discharge Instructions     BACK PAIN: Stressed avoiding painful activities . RICE (REST, ICE, COMPRESSION, ELEVATION) guidelines reviewed. May alternate ice and heat. Consider use of muscle rubs, Salonpas patches, etc. Use medications as directed including muscle relaxers if prescribed. Take medications as  prescribed or OTC NSAIDs/Tylenol.  F/u with PCP in 7-10 days for reexamination, and please feel free to call or return to the urgent care at any time for any questions or concerns you may have and we will be happy to help you!   BACK PAIN RED FLAGS: If the back pain acutely worsens or there are any red flag symptoms such as numbness/tingling, leg weakness, saddle anesthesia, or loss of bowel/bladder control, go immediately to the ER. Follow up with [place] as scheduled or sooner if the pain does not begin to resolve or if it worsens before the follow up    UTI: Based on either symptoms or urinalysis, you may have a urinary tract infection. We will send the urine for culture and call with results in a few days. Begin antibiotics at this time. Your symptoms should be much improved over the next 2-3 days. Increase rest and fluid intake. If for some reason symptoms are worsening or not improving after a couple of days or the urine culture determines the antibiotics you are taking will not treat the infection, the antibiotics may be changed. Return or go to ER for fever, back pain, worsening urinary pain, discharge, increased blood in urine. May take Tylenol or Motrin OTC for pain relief or consider AZO if no contraindications     ED Prescriptions    Medication Sig Dispense Auth. Provider   sulfamethoxazole-trimethoprim (BACTRIM DS) 800-160 MG tablet Take 1 tablet by mouth 2 (two) times daily for 7 days. 14 tablet Laurene Footman B, PA-C   HYDROcodone-acetaminophen (NORCO/VICODIN) 5-325 MG tablet Take 2 tablets by mouth every 6 (six) hours as needed for up to 8 days. 6 tablet Danton Clap, PA-C     I have reviewed the PDMP during this encounter.   Danton Clap, PA-C 01/23/20 1113

## 2020-01-23 NOTE — ED Triage Notes (Signed)
Patient c/o lower back pain that started yesterday.  Patient denies injury or fall.  Patient states that she has had lower back problems before.

## 2020-01-23 NOTE — Discharge Instructions (Signed)
BACK PAIN: Stressed avoiding painful activities . RICE (REST, ICE, COMPRESSION, ELEVATION) guidelines reviewed. May alternate ice and heat. Consider use of muscle rubs, Salonpas patches, etc. Use medications as directed including muscle relaxers if prescribed. Take medications as prescribed or OTC NSAIDs/Tylenol.  F/u with PCP in 7-10 days for reexamination, and please feel free to call or return to the urgent care at any time for any questions or concerns you may have and we will be happy to help you!   BACK PAIN RED FLAGS: If the back pain acutely worsens or there are any red flag symptoms such as numbness/tingling, leg weakness, saddle anesthesia, or loss of bowel/bladder control, go immediately to the ER. Follow up with [place] as scheduled or sooner if the pain does not begin to resolve or if it worsens before the follow up    UTI: Based on either symptoms or urinalysis, you may have a urinary tract infection. We will send the urine for culture and call with results in a few days. Begin antibiotics at this time. Your symptoms should be much improved over the next 2-3 days. Increase rest and fluid intake. If for some reason symptoms are worsening or not improving after a couple of days or the urine culture determines the antibiotics you are taking will not treat the infection, the antibiotics may be changed. Return or go to ER for fever, back pain, worsening urinary pain, discharge, increased blood in urine. May take Tylenol or Motrin OTC for pain relief or consider AZO if no contraindications

## 2020-01-26 LAB — URINE CULTURE: Culture: >=100000 — AB

## 2020-01-27 ENCOUNTER — Telehealth (HOSPITAL_COMMUNITY): Payer: Self-pay | Admitting: Emergency Medicine

## 2020-01-27 NOTE — Telephone Encounter (Signed)
Patient called for update on urine culture.  Verified identity using two identifiers and discussed results.  Positive for UTI, patient went home on Bactrim, no change to therapy needed.  Patient endorses continued, but improving, back pain (now rates it a 2/10).  This RN encouraged her to finish abx and return if symptoms persist at that point.  Patient verbalized understanding.

## 2020-01-29 DIAGNOSIS — M545 Low back pain: Secondary | ICD-10-CM | POA: Diagnosis not present

## 2020-04-06 ENCOUNTER — Telehealth: Payer: Self-pay

## 2020-04-06 NOTE — Telephone Encounter (Signed)
Pt left v/m that she has had back pain for 2 wks; position being on feet or sitting does not change the pain. Pt has hx of 2 degenerative disc and wonders if needs some type bone scan. I was unable to reach pt or pts daughter by phone and left v/m requesting pt to cb to [facility].

## 2020-04-06 NOTE — Telephone Encounter (Signed)
Still unable to reach pt by phone. Sending note to [facility] CMA.

## 2020-04-07 NOTE — Telephone Encounter (Signed)
[place] [facility] - Client Nonclinical Telephone Record [facility] Client [facility] - Client Client Site [place] - Night Physician Eliezer Lofts - MD Contact Type Call Who Is Calling Patient / Member / Family / Caregiver Caller Name Paislynn Hegstrom Caller Phone Number [phone] Call Type Message Only Information Provided Reason for Call Returning a Call from the Office Initial Comment Returning a call from the office. Disp. Time Disposition Final User 04/06/2020 5:12:04 PM General Information Provided Yes Wisdom, Melynda Call Closed By: Mauri Pole Transaction Date/Time: 04/06/2020 5:10:34 PM (ET)

## 2020-04-07 NOTE — Telephone Encounter (Signed)
I was unable to speak with pt and left another v/m for pt to call back to [facility].

## 2020-04-07 NOTE — Telephone Encounter (Signed)
Patient left a voicemail stating that she got the message left for her. Patient stated that she is at work and not able to talk. Patient requested that a detailed message be left on her voicemail letting her know exactly what information is needed. Patient stated that she does not go to lunch and able to talk until 12:30 pm. Patient stated during her lunch she can see her message and try to call the office back with the information needed.

## 2020-04-07 NOTE — Telephone Encounter (Signed)
Left v/m ;that I cannot leave specific questions on v/m that answer to some questions could have different FU questions. I left v/m to call when it was convenient for pt when she would be able to talk with a triage nurse.

## 2020-04-08 NOTE — Telephone Encounter (Signed)
Sounds like pt likely need appt to be seen.Can't order imaging over phone without visit anyway.

## 2020-04-08 NOTE — Telephone Encounter (Signed)
Per pt instructions yesterday Pt notified by v/m Dr Rometta Emery comments; pt requested to call for appt. FYI to Dr Diona Browner and Butch Penny CMA.

## 2020-04-13 ENCOUNTER — Other Ambulatory Visit: Payer: Self-pay

## 2020-04-13 ENCOUNTER — Ambulatory Visit (INDEPENDENT_AMBULATORY_CARE_PROVIDER_SITE_OTHER): Payer: PPO | Admitting: Family Medicine

## 2020-04-13 ENCOUNTER — Encounter: Payer: Self-pay | Admitting: Family Medicine

## 2020-04-13 ENCOUNTER — Ambulatory Visit (INDEPENDENT_AMBULATORY_CARE_PROVIDER_SITE_OTHER)
Admission: RE | Admit: 2020-04-13 | Discharge: 2020-04-13 | Disposition: A | Discharge status: Home / Self Care | Payer: PPO | Source: Ambulatory Visit | Attending: Family Medicine | Admitting: Family Medicine

## 2020-04-13 VITALS — BP 140/76 | HR 55 | Temp 97.8°F | Resp 15 | Ht 64.0 in | Wt 198.8 lb

## 2020-04-13 DIAGNOSIS — W19XXXA Unspecified fall, initial encounter: Secondary | ICD-10-CM | POA: Diagnosis not present

## 2020-04-13 DIAGNOSIS — M8588 Other specified disorders of bone density and structure, other site: Secondary | ICD-10-CM | POA: Diagnosis not present

## 2020-04-13 DIAGNOSIS — M546 Pain in thoracic spine: Secondary | ICD-10-CM

## 2020-04-13 DIAGNOSIS — M40204 Unspecified kyphosis, thoracic region: Secondary | ICD-10-CM | POA: Diagnosis not present

## 2020-04-13 DIAGNOSIS — M5134 Other intervertebral disc degeneration, thoracic region: Secondary | ICD-10-CM | POA: Diagnosis not present

## 2020-04-13 DIAGNOSIS — R079 Chest pain, unspecified: Secondary | ICD-10-CM | POA: Diagnosis not present

## 2020-04-13 MED ORDER — DICLOFENAC SODIUM 75 MG PO TBEC: 75.0000 mg | DELAYED_RELEASE_TABLET | Freq: Two times a day (BID) | ORAL | 0 refills | Status: DC

## 2020-04-13 NOTE — Patient Instructions (Signed)
We will call with X-ray results.  Start diclofenac twice daily for 1-2 weeks with a meal.  Start upper back stretching, heat on back.

## 2020-04-13 NOTE — Assessment & Plan Note (Signed)
Eval with X-ray given fall, age  and osteopenia.  Teat with NSAIDS, heat and start home PT.  Follow up in 4 weeks if not improving for possible referral to PT.

## 2020-04-13 NOTE — Progress Notes (Signed)
Patient ID: Rebekah Johnston, female    DOB: 1951-02-22, 69 y.o.   MRN: [id number]  This visit was conducted in person.  BP 140/76 (BP Location: Left Arm, Patient Position: Sitting, Cuff Size: Normal)   Pulse (!) 55   Temp 97.8 F (36.6 C) (Oral)   Resp 15   Ht 5\' 4"  (1.626 m)   Wt 198 lb 12.8 oz (90.2 kg)   SpO2 99%   BMI 34.12 kg/m    CC: low back pain Subjective:   HPI: Rebekah Johnston is a 69 y.o. female presenting on 04/13/2020 for Acute Visit (Back Issues)   She has had new onset pain in mid   thoracic back in last 2 months. [place] after missing last step in garage on 02/28/2020.. not sure if pain started before r after this,also did fall accidentally last week from wheeled cart.. sat down hard on concrete floor. No change in mid back back. She reports she is sitting more at job, new job and different chair.Marland Kitchen stool.  Pain comes and goes, worse with sitting or standing. Improved some with stretching.  No numbness, no weakness.  No relief with tylenol and home stretching.  No numbness, no weakness, no rash.  Hx of DDD in lumbar spine, and osteopenia  No back surgeries.   X-ray 2017 IMPRESSION: 1. No fracture or acute finding. 2. Disc degenerative change at L4-L5 and L5-S1, most evident at L4-L5. No other degenerative change.     Relevant past medical, surgical, family and social history reviewed and updated as indicated. Interim medical history since our last visit reviewed. Allergies and medications reviewed and updated. Outpatient Medications Prior to Visit  Medication Sig Dispense Refill  . acetaminophen (TYLENOL) 325 MG tablet Take 650 mg by mouth every 6 (six) hours as needed.    Marland Kitchen albuterol (PROVENTIL HFA;VENTOLIN HFA) 108 (90 Base) MCG/ACT inhaler Inhale 1-2 puffs into the lungs every 6 (six) hours as needed for wheezing or shortness of breath. 1 Inhaler 0  . Cholecalciferol (VITAMIN D3) 2000 units TABS Take by mouth daily.    . citalopram  (CELEXA) 40 MG tablet TAKE 1 TABLET BY MOUTH EVERY DAY 90 tablet 1  . fluticasone (FLONASE) 50 MCG/ACT nasal spray Place 2 sprays into both nostrils as needed.     . pantoprazole (PROTONIX) 40 MG tablet Take 40 mg by mouth daily.  11  . Spacer/Aero-Holding Chambers (AEROCHAMBER PLUS) inhaler Use as instructed 1 each 2  . atorvastatin (LIPITOR) 10 MG tablet TAKE 1 TABLET BY MOUTH EVERY DAY (Patient not taking: Reported on 04/13/2020) 90 tablet 1   No facility-administered medications prior to visit.     Per HPI unless specifically indicated in ROS section below Review of Systems  Constitutional: Negative for fatigue and fever.  HENT: Negative for congestion.   Eyes: Negative for pain.  Respiratory: Negative for cough and shortness of breath.   Cardiovascular: Negative for chest pain, palpitations and leg swelling.  Gastrointestinal: Negative for abdominal pain.  Genitourinary: Negative for dysuria and vaginal bleeding.  Musculoskeletal: Positive for back pain.  Neurological: Negative for syncope, light-headedness and headaches.  Psychiatric/Behavioral: Negative for dysphoric mood.   Objective:  BP 140/76 (BP Location: Left Arm, Patient Position: Sitting, Cuff Size: Normal)   Pulse (!) 55   Temp 97.8 F (36.6 C) (Oral)   Resp 15   Ht 5\' 4"  (1.626 m)   Wt 198 lb 12.8 oz (90.2 kg)   SpO2 99%  BMI 34.12 kg/m   Wt Readings from Last 3 Encounters:  04/13/20 198 lb 12.8 oz (90.2 kg)  01/23/20 200 lb (90.7 kg)  08/19/19 195 lb (88.5 kg)      Physical Exam Constitutional:      General: She is not in acute distress.Vital signs are normal.     Appearance: Normal appearance. She is well-developed and well-nourished. She is not ill-appearing or toxic-appearing.  HENT:     Head: Normocephalic.     Right Ear: Hearing, tympanic membrane, ear canal and external ear normal. Tympanic membrane is not erythematous, retracted or bulging.     Left Ear: Hearing, tympanic membrane, ear canal and  external ear normal. Tympanic membrane is not erythematous, retracted or bulging.     Nose: No mucosal edema or rhinorrhea.     Right Sinus: No maxillary sinus tenderness or frontal sinus tenderness.     Left Sinus: No maxillary sinus tenderness or frontal sinus tenderness.     Mouth/Throat:     Mouth: Oropharynx is clear and moist and mucous membranes are normal.     Pharynx: Uvula midline.  Eyes:     General: Lids are normal. Lids are everted, no foreign bodies appreciated.     Extraocular Movements: EOM normal.     Conjunctiva/sclera: Conjunctivae normal.     Pupils: Pupils are equal, round, and reactive to light.  Neck:     Thyroid: No thyroid mass or thyromegaly.     Vascular: No carotid bruit.     Trachea: Trachea normal.  Cardiovascular:     Rate and Rhythm: Normal rate and regular rhythm.     Pulses: Normal pulses and intact distal pulses.     Heart sounds: Normal heart sounds, S1 normal and S2 normal. No murmur heard. No friction rub. No gallop.   Pulmonary:     Effort: Pulmonary effort is normal. No tachypnea or respiratory distress.     Breath sounds: Normal breath sounds. No decreased breath sounds, wheezing, rhonchi or rales.  Abdominal:     General: Bowel sounds are normal.     Palpations: Abdomen is soft.     Tenderness: There is no abdominal tenderness.  Musculoskeletal:     Cervical back: Normal, normal range of motion and neck supple.     Thoracic back: Tenderness present. No swelling, deformity, lacerations, spasms or bony tenderness. Normal range of motion.     Lumbar back: Normal.  Skin:    General: Skin is warm, dry and intact.     Findings: No rash.  Neurological:     Mental Status: She is alert.  Psychiatric:        Mood and Affect: Mood is not anxious or depressed.        Speech: Speech normal.        Behavior: Behavior normal. Behavior is cooperative.        Thought Content: Thought content normal.        Cognition and Memory: Cognition and memory  normal.        Judgment: Judgment normal.       Results for orders placed or performed during the hospital encounter of 01/23/20  Urine culture   Specimen: Urine, Clean Catch  Result Value Ref Range   Specimen Description      URINE, CLEAN CATCH Performed at [facility], [address] [place], [place] [address]    Special Requests      NONE Performed at [facility],  [address] [place], [place] [address]    Culture >=100,000 COLONIES/mL KLEBSIELLA PNEUMONIAE (A)    Report Status 01/26/2020 FINAL    Organism ID, Bacteria KLEBSIELLA PNEUMONIAE (A)       Susceptibility   Klebsiella pneumoniae - MIC*    AMPICILLIN RESISTANT Resistant     CEFAZOLIN <=4 SENSITIVE Sensitive     CEFTRIAXONE <=0.25 SENSITIVE Sensitive     CIPROFLOXACIN <=0.25 SENSITIVE Sensitive     GENTAMICIN <=1 SENSITIVE Sensitive     IMIPENEM <=0.25 SENSITIVE Sensitive     NITROFURANTOIN 32 SENSITIVE Sensitive     TRIMETH/SULFA <=20 SENSITIVE Sensitive     AMPICILLIN/SULBACTAM 4 SENSITIVE Sensitive     PIP/TAZO <=4 SENSITIVE Sensitive     * >=100,000 COLONIES/mL KLEBSIELLA PNEUMONIAE  Urinalysis, Complete w Microscopic Urine, Clean Catch  Result Value Ref Range   Color, Urine YELLOW YELLOW   APPearance HAZY (A) CLEAR   Specific Gravity, Urine 1.025 1.005 - 1.030   pH 5.5 5.0 - 8.0   Glucose, UA NEGATIVE NEGATIVE mg/dL   Hgb urine dipstick TRACE (A) NEGATIVE   Bilirubin Urine NEGATIVE NEGATIVE   Ketones, ur NEGATIVE NEGATIVE mg/dL   Protein, ur NEGATIVE NEGATIVE mg/dL   Nitrite POSITIVE (A) NEGATIVE   Leukocytes,Ua TRACE (A) NEGATIVE   Squamous Epithelial / LPF 0-5 0 - 5   WBC, UA 11-20 0 - 5 WBC/hpf   RBC / HPF 0-5 0 - 5 RBC/hpf   Bacteria, UA MANY (A) NONE SEEN   Hyaline Casts, UA PRESENT     This visit occurred during the SARS-CoV-2 public health emergency.  Safety protocols were in place, including screening questions prior to the visit, additional usage of staff  PPE, and extensive cleaning of exam room while observing appropriate contact time as indicated for disinfecting solutions.   COVID 19 screen:  No recent travel or known exposure to COVID19 The patient denies respiratory symptoms of COVID 19 at this time. The importance of social distancing was discussed today.   Assessment and Plan  Problem List Items Addressed This Visit    Acute midline thoracic back pain - Primary     Eval with X-ray given fall, age  and osteopenia.  Teat with NSAIDS, heat and start home PT.  Follow up in 4 weeks if not improving for possible referral to PT.      Relevant Medications   diclofenac (VOLTAREN) 75 MG EC tablet   Other Relevant Orders   DG Thoracic Spine W/Swimmers   Osteopenia   Relevant Orders   DG Thoracic Spine W/Swimmers    Other Visit Diagnoses    Accidental fall, initial encounter       Relevant Orders   DG Thoracic Spine W/Swimmers         Eliezer Lofts, MD

## 2020-04-21 DIAGNOSIS — R42 Dizziness and giddiness: Secondary | ICD-10-CM | POA: Diagnosis not present

## 2020-05-06 DIAGNOSIS — R42 Dizziness and giddiness: Secondary | ICD-10-CM | POA: Diagnosis not present

## 2020-06-03 DIAGNOSIS — G4733 Obstructive sleep apnea (adult) (pediatric): Secondary | ICD-10-CM | POA: Diagnosis not present

## 2020-08-20 DIAGNOSIS — E669 Obesity, unspecified: Secondary | ICD-10-CM | POA: Diagnosis not present

## 2020-08-20 DIAGNOSIS — R9431 Abnormal electrocardiogram [ECG] [EKG]: Secondary | ICD-10-CM | POA: Diagnosis not present

## 2020-08-20 DIAGNOSIS — R079 Chest pain, unspecified: Secondary | ICD-10-CM | POA: Diagnosis not present

## 2020-08-20 DIAGNOSIS — I340 Nonrheumatic mitral (valve) insufficiency: Secondary | ICD-10-CM | POA: Diagnosis not present

## 2020-08-20 DIAGNOSIS — R0789 Other chest pain: Secondary | ICD-10-CM | POA: Diagnosis not present

## 2020-08-20 DIAGNOSIS — R0602 Shortness of breath: Secondary | ICD-10-CM | POA: Diagnosis not present

## 2020-08-20 DIAGNOSIS — K2101 Gastro-esophageal reflux disease with esophagitis, with bleeding: Secondary | ICD-10-CM | POA: Diagnosis not present

## 2020-08-20 DIAGNOSIS — E782 Mixed hyperlipidemia: Secondary | ICD-10-CM | POA: Diagnosis not present

## 2020-08-20 DIAGNOSIS — I10 Essential (primary) hypertension: Secondary | ICD-10-CM | POA: Diagnosis not present

## 2020-08-23 ENCOUNTER — Other Ambulatory Visit: Payer: Self-pay | Admitting: Family Medicine

## 2020-08-23 NOTE — Telephone Encounter (Signed)
Please schedule MWV with nurse and CPE with fasting labs prior with Dr. Bedsole.  ?

## 2020-08-24 NOTE — Telephone Encounter (Signed)
Left voice message to call the office  

## 2020-08-27 DIAGNOSIS — R9431 Abnormal electrocardiogram [ECG] [EKG]: Secondary | ICD-10-CM | POA: Diagnosis not present

## 2020-08-27 DIAGNOSIS — E782 Mixed hyperlipidemia: Secondary | ICD-10-CM | POA: Diagnosis not present

## 2020-08-27 DIAGNOSIS — I340 Nonrheumatic mitral (valve) insufficiency: Secondary | ICD-10-CM | POA: Diagnosis not present

## 2020-08-27 DIAGNOSIS — R0602 Shortness of breath: Secondary | ICD-10-CM | POA: Diagnosis not present

## 2020-08-27 DIAGNOSIS — K2101 Gastro-esophageal reflux disease with esophagitis, with bleeding: Secondary | ICD-10-CM | POA: Diagnosis not present

## 2020-08-27 DIAGNOSIS — E669 Obesity, unspecified: Secondary | ICD-10-CM | POA: Diagnosis not present

## 2020-08-27 DIAGNOSIS — R0789 Other chest pain: Secondary | ICD-10-CM | POA: Diagnosis not present

## 2020-08-27 DIAGNOSIS — R079 Chest pain, unspecified: Secondary | ICD-10-CM | POA: Diagnosis not present

## 2020-08-27 DIAGNOSIS — I10 Essential (primary) hypertension: Secondary | ICD-10-CM | POA: Diagnosis not present

## 2020-08-31 DIAGNOSIS — I10 Essential (primary) hypertension: Secondary | ICD-10-CM | POA: Diagnosis not present

## 2020-08-31 DIAGNOSIS — K2101 Gastro-esophageal reflux disease with esophagitis, with bleeding: Secondary | ICD-10-CM | POA: Diagnosis not present

## 2020-08-31 DIAGNOSIS — R9431 Abnormal electrocardiogram [ECG] [EKG]: Secondary | ICD-10-CM | POA: Diagnosis not present

## 2020-08-31 DIAGNOSIS — E669 Obesity, unspecified: Secondary | ICD-10-CM | POA: Diagnosis not present

## 2020-08-31 DIAGNOSIS — I340 Nonrheumatic mitral (valve) insufficiency: Secondary | ICD-10-CM | POA: Diagnosis not present

## 2020-08-31 DIAGNOSIS — R079 Chest pain, unspecified: Secondary | ICD-10-CM | POA: Diagnosis not present

## 2020-08-31 DIAGNOSIS — R0602 Shortness of breath: Secondary | ICD-10-CM | POA: Diagnosis not present

## 2020-08-31 DIAGNOSIS — E782 Mixed hyperlipidemia: Secondary | ICD-10-CM | POA: Diagnosis not present

## 2020-08-31 DIAGNOSIS — R0789 Other chest pain: Secondary | ICD-10-CM | POA: Diagnosis not present

## 2020-08-31 NOTE — Telephone Encounter (Signed)
Left voice message to call the office  

## 2020-09-10 ENCOUNTER — Telehealth: Payer: Self-pay | Admitting: Family Medicine

## 2020-09-10 NOTE — Chronic Care Management (AMB) (Signed)
  Chronic Care Management   Outreach Note  09/10/2020 Name: Rebekah Johnston MRN: [id number] DOB: [birth year]  Referred by: Jinny Sanders, MD Reason for referral : No chief complaint on file.   An unsuccessful telephone outreach was attempted today. The patient was referred to the pharmacist for assistance with care management and care coordination.   Follow Up Plan:   Lauretta Grill Upstream Scheduler

## 2020-09-14 ENCOUNTER — Telehealth: Payer: Self-pay | Admitting: Family Medicine

## 2020-09-14 DIAGNOSIS — E7800 Pure hypercholesterolemia, unspecified: Secondary | ICD-10-CM

## 2020-09-14 DIAGNOSIS — R7309 Other abnormal glucose: Secondary | ICD-10-CM

## 2020-09-14 DIAGNOSIS — E559 Vitamin D deficiency, unspecified: Secondary | ICD-10-CM

## 2020-09-14 NOTE — Telephone Encounter (Signed)
-----   Message from Cloyd Stagers, RT sent at 09/13/2020  2:18 PM EDT ----- Regarding: Lab Orders for Thursday 6.2.2022 Please place lab orders for Thursday 6.2.2022, office visit for physical on Thursday 6.9.2022 Thank you, Dyke Maes RT(R)

## 2020-09-16 DIAGNOSIS — E669 Obesity, unspecified: Secondary | ICD-10-CM | POA: Diagnosis not present

## 2020-09-16 DIAGNOSIS — R9431 Abnormal electrocardiogram [ECG] [EKG]: Secondary | ICD-10-CM | POA: Diagnosis not present

## 2020-09-16 DIAGNOSIS — R079 Chest pain, unspecified: Secondary | ICD-10-CM | POA: Diagnosis not present

## 2020-09-16 DIAGNOSIS — I340 Nonrheumatic mitral (valve) insufficiency: Secondary | ICD-10-CM | POA: Diagnosis not present

## 2020-09-16 DIAGNOSIS — E782 Mixed hyperlipidemia: Secondary | ICD-10-CM | POA: Diagnosis not present

## 2020-09-16 DIAGNOSIS — R0789 Other chest pain: Secondary | ICD-10-CM | POA: Diagnosis not present

## 2020-09-16 DIAGNOSIS — I10 Essential (primary) hypertension: Secondary | ICD-10-CM | POA: Diagnosis not present

## 2020-09-16 DIAGNOSIS — R0602 Shortness of breath: Secondary | ICD-10-CM | POA: Diagnosis not present

## 2020-09-16 DIAGNOSIS — K2101 Gastro-esophageal reflux disease with esophagitis, with bleeding: Secondary | ICD-10-CM | POA: Diagnosis not present

## 2020-09-20 ENCOUNTER — Telehealth: Payer: Self-pay | Admitting: Family Medicine

## 2020-09-20 NOTE — Progress Notes (Signed)
  Chronic Care Management   Outreach Note  09/20/2020 Name: Rebekah Johnston MRN: [id number] DOB: 21-Dec-1950  Referred by: Jinny Sanders, MD Reason for referral : No chief complaint on file.   A second unsuccessful telephone outreach was attempted today. The patient was referred to pharmacist for assistance with care management and care coordination.  Follow Up Plan:   Tatjana Dellinger Upstream scheduler

## 2020-09-21 ENCOUNTER — Telehealth: Payer: Self-pay | Admitting: Family Medicine

## 2020-09-21 DIAGNOSIS — E669 Obesity, unspecified: Secondary | ICD-10-CM | POA: Diagnosis not present

## 2020-09-21 DIAGNOSIS — R0602 Shortness of breath: Secondary | ICD-10-CM | POA: Diagnosis not present

## 2020-09-21 DIAGNOSIS — K2101 Gastro-esophageal reflux disease with esophagitis, with bleeding: Secondary | ICD-10-CM | POA: Diagnosis not present

## 2020-09-21 DIAGNOSIS — E782 Mixed hyperlipidemia: Secondary | ICD-10-CM | POA: Diagnosis not present

## 2020-09-21 DIAGNOSIS — R079 Chest pain, unspecified: Secondary | ICD-10-CM | POA: Diagnosis not present

## 2020-09-21 DIAGNOSIS — R9431 Abnormal electrocardiogram [ECG] [EKG]: Secondary | ICD-10-CM | POA: Diagnosis not present

## 2020-09-21 DIAGNOSIS — R0789 Other chest pain: Secondary | ICD-10-CM | POA: Diagnosis not present

## 2020-09-21 DIAGNOSIS — I10 Essential (primary) hypertension: Secondary | ICD-10-CM | POA: Diagnosis not present

## 2020-09-21 DIAGNOSIS — I340 Nonrheumatic mitral (valve) insufficiency: Secondary | ICD-10-CM | POA: Diagnosis not present

## 2020-09-21 NOTE — Telephone Encounter (Signed)
Rebekah Johnston called in returning phone call and I related the message and she stated that she needs another time due to she gets off at 330.   Please advise

## 2020-09-21 NOTE — Telephone Encounter (Signed)
LVM for pt to rtn my all to schedule AWV with [facility]. I have put a hold on 09/29/20 at 3:30 pm for this patient .

## 2020-09-30 ENCOUNTER — Other Ambulatory Visit: Payer: PPO

## 2020-10-01 ENCOUNTER — Other Ambulatory Visit (INDEPENDENT_AMBULATORY_CARE_PROVIDER_SITE_OTHER): Payer: PPO

## 2020-10-01 ENCOUNTER — Other Ambulatory Visit: Payer: Self-pay

## 2020-10-01 DIAGNOSIS — E559 Vitamin D deficiency, unspecified: Secondary | ICD-10-CM

## 2020-10-01 DIAGNOSIS — E7800 Pure hypercholesterolemia, unspecified: Secondary | ICD-10-CM

## 2020-10-01 DIAGNOSIS — R7309 Other abnormal glucose: Secondary | ICD-10-CM

## 2020-10-01 LAB — VITAMIN D 25 HYDROXY (VIT D DEFICIENCY, FRACTURES): VITD: 24.22 ng/mL — ABNORMAL LOW (ref 30.00–100.00)

## 2020-10-01 LAB — HEMOGLOBIN A1C: Hgb A1c MFr Bld: 6 % (ref 4.6–6.5)

## 2020-10-04 LAB — COMPREHENSIVE METABOLIC PANEL
ALT: 21 U/L (ref 0–35)
AST: 18 U/L (ref 0–37)
Albumin: 3.9 g/dL (ref 3.5–5.2)
Alkaline Phosphatase: 72 U/L (ref 39–117)
BUN: 13 mg/dL (ref 6–23)
CO2: 24 mEq/L (ref 19–32)
Calcium: 9.3 mg/dL (ref 8.4–10.5)
Chloride: 105 mEq/L (ref 96–112)
Creatinine, Ser: 1.24 mg/dL — ABNORMAL HIGH (ref 0.40–1.20)
GFR: 43.95 mL/min — ABNORMAL LOW (ref >60.00)
Glucose, Bld: 126 mg/dL — ABNORMAL HIGH (ref 70–99)
Potassium: 4.8 mEq/L (ref 3.5–5.1)
Sodium: 139 mEq/L (ref 135–145)
Total Bilirubin: 0.5 mg/dL (ref 0.2–1.2)
Total Protein: 6.4 g/dL (ref 6.0–8.3)

## 2020-10-04 LAB — LIPID PANEL
Cholesterol: 164 mg/dL (ref 0–200)
HDL: 56.2 mg/dL (ref >39.00)
LDL Cholesterol: 91 mg/dL (ref 0–99)
NonHDL: 107.41
Total CHOL/HDL Ratio: 3
Triglycerides: 82 mg/dL (ref 0.0–149.0)
VLDL: 16.4 mg/dL (ref 0.0–40.0)

## 2020-10-04 NOTE — Progress Notes (Signed)
No critical labs need to be addressed urgently. We will discuss labs in detail at upcoming office visit.   

## 2020-10-07 ENCOUNTER — Encounter: Payer: PPO | Admitting: Family Medicine

## 2020-10-14 ENCOUNTER — Telehealth: Payer: Self-pay | Admitting: Family Medicine

## 2020-10-14 NOTE — Chronic Care Management (AMB) (Signed)
  Chronic Care Management   Outreach Note  10/14/2020 Name: Rebekah Johnston MRN: [id number] DOB: 1951-02-21  Referred by: Jinny Sanders, MD Reason for referral : No chief complaint on file.   Third unsuccessful telephone outreach was attempted today. The patient was referred to the pharmacist for assistance with care management and care coordination.   Follow Up Plan:   Tatjana Dellinger Upstream Scheduler

## 2020-11-16 ENCOUNTER — Other Ambulatory Visit: Payer: Self-pay

## 2020-11-16 ENCOUNTER — Encounter: Payer: Self-pay | Admitting: Family Medicine

## 2020-11-16 ENCOUNTER — Ambulatory Visit (INDEPENDENT_AMBULATORY_CARE_PROVIDER_SITE_OTHER): Payer: PPO | Admitting: Family Medicine

## 2020-11-16 VITALS — BP 118/72 | HR 72 | Temp 98.0°F | Ht 63.0 in | Wt 190.5 lb

## 2020-11-16 DIAGNOSIS — F331 Major depressive disorder, recurrent, moderate: Secondary | ICD-10-CM

## 2020-11-16 DIAGNOSIS — E7800 Pure hypercholesterolemia, unspecified: Secondary | ICD-10-CM | POA: Diagnosis not present

## 2020-11-16 DIAGNOSIS — Z0000 Encounter for general adult medical examination without abnormal findings: Secondary | ICD-10-CM

## 2020-11-16 NOTE — Assessment & Plan Note (Signed)
Stable, chronic.  Continue current medication.   Crestor 5 mg daily. 

## 2020-11-16 NOTE — Patient Instructions (Addendum)
Restart vit D OTC 2000 IU daily.  Continue working on healthy eating and regular exercise.  Please call the location of your choice from the menu below to schedule your Mammogram and/or Bone Density appointment.    [facility]                      Phone:  [phone], [place] [address]                                                             Services: Traditional and 3D Mammogram, [facility] [facility]                 Phone: [phone] [address], [place] [address]    Service: Bone Density ONLY   *this site does NOT perform mammograms  [place]                        Phone:  [phone] [address], [place] [address]                                            Services:  3D Mammogram and [place] at [facility]   Phone:  [phone]   [place], [place] [address]                                            Services: 3D Mammogram and [place]  [place] at [facility]  Phone:  [phone]   [address] [place], [place]  [address]                                              Services:  3D Mammogram and Bone Density

## 2020-11-16 NOTE — Assessment & Plan Note (Signed)
Stable, chronic.  Continue current medication.   celexa 40 mg daily

## 2020-11-16 NOTE — Progress Notes (Signed)
Patient ID: Rebekah Johnston, female    DOB: 07/01/1950, 70 y.o.   MRN: [id number]  This visit was conducted in person.  BP 118/72   Pulse 72   Temp 98 F (36.7 C) (Temporal)   Ht 5\' 3"  (1.6 m)   Wt 190 lb 8 oz (86.4 kg)   SpO2 97%   BMI 33.75 kg/m    Wt Readings from Last 3 Encounters:  11/16/20 190 lb 8 oz (86.4 kg)  04/13/20 198 lb 12.8 oz (90.2 kg)  01/23/20 200 lb (90.7 kg)    CC: Chief Complaint  Patient presents with   Medicare Wellness   Subjective:   HPI: Rebekah Johnston is a 70 y.o. female presenting on 11/16/2020 for Medicare Wellness The patient presents for annual medicare wellness, complete physical and review of chronic health problems. He/She also has the following acute concerns today:  I have personally reviewed the Medicare Annual Wellness questionnaire and have noted 1. The patient's medical and social history 2. Their use of alcohol, tobacco or illicit drugs 3. Their current medications and supplements 4. The patient's functional ability including ADL's, fall risks, home safety risks and hearing or visual             impairment. 5. Diet and physical activities 6. Evidence for depression or mood disorders 7.         Updated provider list Cognitive evaluation was performed and recorded on pt medicare questionnaire form. The patients weight, height, BMI and visual acuity have been recorded in the chart   I have made referrals, counseling and provided education to the patient based review of the above and I have provided the pt with a written personalized care plan for preventive services.   Documentation of this information was scanned into the electronic record under the media tab.  Advance directives and end of life planning reviewed in detail with patient and documented in EMR. Patient given handout on advance care directives if needed. HCPOA and living will updated if needed.  Hearing Screening  Method: Audiometry   500Hz  1000Hz  2000Hz  4000Hz    Right ear 40 20 20 25   Left ear 25 25 20 20    Vision Screening   Right eye Left eye Both eyes  Without correction 20/25 20/50 20/20   With correction       Fall Risk  11/16/2020 04/13/2020 06/24/2019 05/23/2018 04/17/2017  Falls in the past year? 1 1 1  0 Yes  Number falls in past yr: 0 1 1 - 1  Injury with Fall? 0 0 0 - Yes  Follow up - Falls evaluation completed - - Falls prevention discussed     MDD: stable control on celexa 40 mg daily  She continues to work 4-50 hours a week.  PHQ9:1  Elevated Cholesterol:  LDL at goal < 100 on crestor 5 mg  Lab Results  Component Value Date   CHOL 164 10/01/2020   HDL 56.20 10/01/2020   LDLCALC 91 10/01/2020   LDLDIRECT 179.3 06/20/2013   TRIG 82.0 10/01/2020   CHOLHDL 3 10/01/2020  Using medications without problems: none Muscle aches: none Diet compliance: moderate Exercise: walking off and on.. current active packing Other complaints: Has lost 10 lbs in last year.   CKD: stable GFR 43 Avoiding NSAIDs  Prediabetes  Lab Results  Component Value Date   HGBA1C 6.0 10/01/2020     It D.. has been off supplement.      Relevant past medical, surgical, family and social  history reviewed and updated as indicated. Interim medical history since our last visit reviewed. Allergies and medications reviewed and updated. Outpatient Medications Prior to Visit  Medication Sig Dispense Refill   acetaminophen (TYLENOL) 325 MG tablet Take 650 mg by mouth every 6 (six) hours as needed.     citalopram (CELEXA) 40 MG tablet TAKE 1 TABLET BY MOUTH EVERY DAY 90 tablet 0   pantoprazole (PROTONIX) 40 MG tablet Take 40 mg by mouth daily.  11   rosuvastatin (CRESTOR) 5 MG tablet Take 5 mg by mouth daily.     albuterol (PROVENTIL HFA;VENTOLIN HFA) 108 (90 Base) MCG/ACT inhaler Inhale 1-2 puffs into the lungs every 6 (six) hours as needed for wheezing or shortness of breath. 1 Inhaler 0   atorvastatin (LIPITOR) 10 MG tablet TAKE 1 TABLET BY MOUTH EVERY  DAY (Patient not taking: Reported on 04/13/2020) 90 tablet 1   Cholecalciferol (VITAMIN D3) 2000 units TABS Take by mouth daily.     diclofenac (VOLTAREN) 75 MG EC tablet Take 1 tablet (75 mg total) by mouth 2 (two) times daily. 30 tablet 0   fluticasone (FLONASE) 50 MCG/ACT nasal spray Place 2 sprays into both nostrils as needed.      Spacer/Aero-Holding Chambers (AEROCHAMBER PLUS) inhaler Use as instructed 1 each 2   No facility-administered medications prior to visit.     Per HPI unless specifically indicated in ROS section below Review of Systems  Constitutional:  Negative for fatigue and fever.  HENT:  Negative for congestion.   Eyes:  Negative for pain.  Respiratory:  Negative for cough and shortness of breath.   Cardiovascular:  Negative for chest pain, palpitations and leg swelling.  Gastrointestinal:  Negative for abdominal pain.  Genitourinary:  Negative for dysuria and vaginal bleeding.  Musculoskeletal:  Negative for back pain.  Neurological:  Negative for syncope, light-headedness and headaches.  Psychiatric/Behavioral:  Negative for dysphoric mood.   Objective:  BP 118/72   Pulse 72   Temp 98 F (36.7 C) (Temporal)   Ht 5\' 3"  (1.6 m)   Wt 190 lb 8 oz (86.4 kg)   SpO2 97%   BMI 33.75 kg/m   Wt Readings from Last 3 Encounters:  11/16/20 190 lb 8 oz (86.4 kg)  04/13/20 198 lb 12.8 oz (90.2 kg)  01/23/20 200 lb (90.7 kg)      Physical Exam Constitutional:      General: She is not in acute distress.    Appearance: Normal appearance. She is well-developed. She is not ill-appearing or toxic-appearing.  HENT:     Head: Normocephalic.     Right Ear: Hearing, tympanic membrane, ear canal and external ear normal.     Left Ear: Hearing, tympanic membrane, ear canal and external ear normal.     Nose: Nose normal.  Eyes:     General: Lids are normal. Lids are everted, no foreign bodies appreciated.     Conjunctiva/sclera: Conjunctivae normal.     Pupils: Pupils are  equal, round, and reactive to light.  Neck:     Thyroid: No thyroid mass or thyromegaly.     Vascular: No carotid bruit.     Trachea: Trachea normal.  Cardiovascular:     Rate and Rhythm: Normal rate and regular rhythm.     Heart sounds: Normal heart sounds, S1 normal and S2 normal. No murmur heard.   No gallop.  Pulmonary:     Effort: Pulmonary effort is normal. No respiratory distress.     Breath  sounds: Normal breath sounds. No wheezing, rhonchi or rales.  Abdominal:     General: Bowel sounds are normal. There is no distension or abdominal bruit.     Palpations: Abdomen is soft. There is no fluid wave or mass.     Tenderness: There is no abdominal tenderness. There is no guarding or rebound.     Hernia: No hernia is present.  Musculoskeletal:     Cervical back: Normal range of motion and neck supple.  Lymphadenopathy:     Cervical: No cervical adenopathy.  Skin:    General: Skin is warm and dry.     Findings: No rash.  Neurological:     Mental Status: She is alert.     Cranial Nerves: No cranial nerve deficit.     Sensory: No sensory deficit.  Psychiatric:        Mood and Affect: Mood is not anxious or depressed.        Speech: Speech normal.        Behavior: Behavior normal. Behavior is cooperative.        Judgment: Judgment normal.      Results for orders placed or performed in visit on 10/01/20  VITAMIN D 25 Hydroxy (Vit-D Deficiency, Fractures)  Result Value Ref Range   VITD 24.22 (L) 30.00 - 100.00 ng/mL  Comprehensive metabolic panel  Result Value Ref Range   Sodium 139 135 - 145 mEq/L   Potassium 4.8 3.5 - 5.1 mEq/L   Chloride 105 96 - 112 mEq/L   CO2 24 19 - 32 mEq/L   Glucose, Bld 126 (H) 70 - 99 mg/dL   BUN 13 6 - 23 mg/dL   Creatinine, Ser 1.24 (H) 0.40 - 1.20 mg/dL   Total Bilirubin 0.5 0.2 - 1.2 mg/dL   Alkaline Phosphatase 72 39 - 117 U/L   AST 18 0 - 37 U/L   ALT 21 0 - 35 U/L   Total Protein 6.4 6.0 - 8.3 g/dL   Albumin 3.9 3.5 - 5.2 g/dL   GFR  43.95 (L) >60.00 mL/min   Calcium 9.3 8.4 - 10.5 mg/dL  Lipid panel  Result Value Ref Range   Cholesterol 164 0 - 200 mg/dL   Triglycerides 82.0 0.0 - 149.0 mg/dL   HDL 56.20 >39.00 mg/dL   VLDL 16.4 0.0 - 40.0 mg/dL   LDL Cholesterol 91 0 - 99 mg/dL   Total CHOL/HDL Ratio 3    NonHDL 107.41   Hemoglobin A1c  Result Value Ref Range   Hgb A1c MFr Bld 6.0 4.6 - 6.5 %    This visit occurred during the SARS-CoV-2 public health emergency.  Safety protocols were in place, including screening questions prior to the visit, additional usage of staff PPE, and extensive cleaning of exam room while observing appropriate contact time as indicated for disinfecting solutions.   COVID 19 screen:  No recent travel or known exposure to COVID19 The patient denies respiratory symptoms of COVID 19 at this time. The importance of social distancing was discussed today.   Assessment and Plan   The patient's preventative maintenance and recommended screening tests for an annual wellness exam were reviewed in full today. Brought up to date unless services declined.  Counselled on the importance of diet, exercise, and its role in overall health and mortality. The patient's FH and SH was reviewed, including their home life, tobacco status, and drug and alcohol status.   Vaccines:uptodate, except consider Shingrix and COVID #4 Pap/DVE:  nml pap, neg  HPV 2015, no further indicated,  Continue DVE 1-2 years. Mammo 05/15/2016.Marland Kitchen Plan q 2 years.. repeat due Bone Density osteopenia 05/2016, repeat in 5 years Colon: 06/2016, Dr. Fuller Plan... Repeat in 5 years. Smoking Status: nonsmoker  Hep C: done  Problem List Items Addressed This Visit     HYPERCHOLESTEROLEMIA    Stable, chronic.  Continue current medication.   Crestor 5 mg daily       Relevant Medications   rosuvastatin (CRESTOR) 5 MG tablet   Major depressive disorder, recurrent episode, moderate (HCC)    Stable, chronic.  Continue current  medication.   celexa 40 mg daily       Medicare annual wellness visit, subsequent - Primary     Eliezer Lofts, MD

## 2020-11-19 ENCOUNTER — Other Ambulatory Visit: Payer: Self-pay | Admitting: Family Medicine

## 2021-01-24 DIAGNOSIS — I340 Nonrheumatic mitral (valve) insufficiency: Secondary | ICD-10-CM | POA: Diagnosis not present

## 2021-01-24 DIAGNOSIS — R9431 Abnormal electrocardiogram [ECG] [EKG]: Secondary | ICD-10-CM | POA: Diagnosis not present

## 2021-01-24 DIAGNOSIS — R0789 Other chest pain: Secondary | ICD-10-CM | POA: Diagnosis not present

## 2021-01-24 DIAGNOSIS — I10 Essential (primary) hypertension: Secondary | ICD-10-CM | POA: Diagnosis not present

## 2021-01-24 DIAGNOSIS — E669 Obesity, unspecified: Secondary | ICD-10-CM | POA: Diagnosis not present

## 2021-01-24 DIAGNOSIS — E782 Mixed hyperlipidemia: Secondary | ICD-10-CM | POA: Diagnosis not present

## 2021-01-24 DIAGNOSIS — R079 Chest pain, unspecified: Secondary | ICD-10-CM | POA: Diagnosis not present

## 2021-01-24 DIAGNOSIS — R0602 Shortness of breath: Secondary | ICD-10-CM | POA: Diagnosis not present

## 2021-01-24 DIAGNOSIS — K2101 Gastro-esophageal reflux disease with esophagitis, with bleeding: Secondary | ICD-10-CM | POA: Diagnosis not present

## 2021-02-17 ENCOUNTER — Ambulatory Visit (INDEPENDENT_AMBULATORY_CARE_PROVIDER_SITE_OTHER): Payer: PPO

## 2021-02-17 ENCOUNTER — Telehealth: Payer: Self-pay

## 2021-02-17 ENCOUNTER — Other Ambulatory Visit: Payer: Self-pay

## 2021-02-17 ENCOUNTER — Ambulatory Visit
Admission: EM | Admit: 2021-02-17 | Discharge: 2021-02-17 | Disposition: A | Discharge status: Home / Self Care | Payer: PPO | Attending: Physician Assistant | Admitting: Physician Assistant

## 2021-02-17 DIAGNOSIS — R8290 Unspecified abnormal findings in urine: Secondary | ICD-10-CM | POA: Insufficient documentation

## 2021-02-17 DIAGNOSIS — M5442 Lumbago with sciatica, left side: Secondary | ICD-10-CM | POA: Insufficient documentation

## 2021-02-17 DIAGNOSIS — R1032 Left lower quadrant pain: Secondary | ICD-10-CM | POA: Diagnosis not present

## 2021-02-17 LAB — URINALYSIS, COMPLETE (UACMP) WITH MICROSCOPIC
Bilirubin Urine: NEGATIVE
Glucose, UA: NEGATIVE mg/dL
Ketones, ur: NEGATIVE mg/dL
Nitrite: NEGATIVE
Protein, ur: NEGATIVE mg/dL
Specific Gravity, Urine: >1.03 — ABNORMAL HIGH (ref 1.005–1.030)
pH: 5.5 (ref 5.0–8.0)

## 2021-02-17 LAB — COMPREHENSIVE METABOLIC PANEL
ALT: 16 U/L (ref 0–44)
AST: 18 U/L (ref 15–41)
Albumin: 3.9 g/dL (ref 3.5–5.0)
Alkaline Phosphatase: 88 U/L (ref 38–126)
Anion gap: 5 (ref 5–15)
BUN: 14 mg/dL (ref 8–23)
CO2: 28 mmol/L (ref 22–32)
Calcium: 9.2 mg/dL (ref 8.9–10.3)
Chloride: 104 mmol/L (ref 98–111)
Creatinine, Ser: 1.28 mg/dL — ABNORMAL HIGH (ref 0.44–1.00)
GFR, Estimated: 45 mL/min — ABNORMAL LOW (ref >60)
Glucose, Bld: 76 mg/dL (ref 70–99)
Potassium: 4 mmol/L (ref 3.5–5.1)
Sodium: 137 mmol/L (ref 135–145)
Total Bilirubin: 0.5 mg/dL (ref 0.3–1.2)
Total Protein: 6.9 g/dL (ref 6.5–8.1)

## 2021-02-17 LAB — CBC WITH DIFFERENTIAL/PLATELET
Abs Immature Granulocytes: 0.01 10*3/uL (ref 0.00–0.07)
Basophils Absolute: 0.1 10*3/uL (ref 0.0–0.1)
Basophils Relative: 1 %
Eosinophils Absolute: 0.1 10*3/uL (ref 0.0–0.5)
Eosinophils Relative: 1 %
HCT: 40.3 % (ref 36.0–46.0)
Hemoglobin: 13 g/dL (ref 12.0–15.0)
Immature Granulocytes: 0 %
Lymphocytes Relative: 27 %
Lymphs Abs: 2 10*3/uL (ref 0.7–4.0)
MCH: 28.6 pg (ref 26.0–34.0)
MCHC: 32.3 g/dL (ref 30.0–36.0)
MCV: 88.6 fL (ref 80.0–100.0)
Monocytes Absolute: 0.7 10*3/uL (ref 0.1–1.0)
Monocytes Relative: 9 %
Neutro Abs: 4.5 10*3/uL (ref 1.7–7.7)
Neutrophils Relative %: 62 %
Platelets: 253 10*3/uL (ref 150–400)
RBC: 4.55 MIL/uL (ref 3.87–5.11)
RDW: 12.2 % (ref 11.5–15.5)
WBC: 7.3 10*3/uL (ref 4.0–10.5)
nRBC: 0 % (ref 0.0–0.2)

## 2021-02-17 NOTE — Telephone Encounter (Signed)
I spoke with pt; pt is on her way to [facility] in [place]. Pt said she is having mid to lower abd pain that is constant pain at pain level of 4 - 5 and at times causes pt to wince in pain. Pt does not have UTI symptoms and pt is not sure if has fever or not. Sending note to Dr Diona Browner who is out of office today and Butch Penny CMA.

## 2021-02-17 NOTE — ED Provider Notes (Signed)
[facility]    CSN: [id number] Arrival date & time: 02/17/21  1037      History   Chief Complaint Chief Complaint  Patient presents with   Abdominal Pain    LLQ    HPI Rebekah Johnston is a 71 y.o. female presenting for onset of left lower quadrant pain about 5 hours ago.  She says the pain is about 4 out of 10.  She says it is constant and feels like a cramping type pain.  Denies any fever, change in appetite, nausea/vomiting/diarrhea/constipation.  No black or bloody stools.  Denies any urinary frequency or urgency.  Admits to a bowel movement yesterday that she says was normal.  Patient does report a history of back pain.  She is currently having pain of the left lower back that radiates down the left leg.  The left leg pain also started today and she rates that pain at about 3 out of 10.  She says the entire leg hurts.  Patient says she has had chronic back pain for years.  She has not taken anything for pain relief.  She says she has ibuprofen if she needs it but her pain has not been that bad.  She does not report any numbness or tingling, leg weakness, saddle anesthesia or loss of bowel or bladder control.  No other complaints.  Past medical history significant for asthma, stage III chronic kidney disease, GERD, hyperlipidemia and osteoarthritis.  No other complaints.  HPI  Past Medical History:  Diagnosis Date   Asthma    no inhalers   Bronchitis    taking meds now   Chronic kidney disease 2008   Stage G3b/A1   Depression    GERD (gastroesophageal reflux disease)    Glaucoma    no eye drops   Hyperlipidemia    no meds   Macular degeneration    Bil   Osteoarthritis     Patient Active Problem List   Diagnosis Date Noted   Acute midline thoracic back pain 04/13/2020   Cough 07/09/2018   ETD (Eustachian tube dysfunction), left 07/09/2018   Vitamin D deficiency 05/10/2017   Medicare annual wellness visit, subsequent 04/17/2017   Trochanteric bursitis  of left hip 03/09/2017   Osteopenia 05/16/2016   Major depressive disorder, recurrent episode, moderate ([place]) 04/04/2016   Gastroesophageal reflux disease 04/04/2016   Low back pain 01/18/2016   Bilateral chronic knee pain 01/18/2016   Counseling regarding end of life decision making 03/18/2015   Chronic kidney disease (CKD) stage G3b/A1, moderately decreased glomerular filtration rate (GFR) between 30-44 mL/min/1.73 square meter and albuminuria creatinine ratio less than 30 mg/g ([place]) 08/20/2008   HYPERCHOLESTEROLEMIA 05/20/2008   ALLERGIC RHINITIS 04/16/2008   EXERCISE INDUCED ASTHMA 04/16/2008   OSTEOARTHRITIS 04/16/2008   PREDIABETES 40/98/1191   UMBILICAL HERNIORRHAPHY, HX OF 04/16/2008    Past Surgical History:  Procedure Laterality Date   BUNIONECTOMY     rt. foot   CHOLECYSTECTOMY     HERNIA REPAIR     left inguinal hernia   TONSILLECTOMY  1967    OB History   No obstetric history on file.      Home Medications    Prior to Admission medications   Medication Sig Start Date End Date Taking? Authorizing Provider  citalopram (CELEXA) 40 MG tablet TAKE 1 TABLET BY MOUTH EVERY DAY 11/21/20  Yes Bedsole, Amy E, MD  pantoprazole (PROTONIX) 40 MG tablet Take 40 mg by mouth daily. 02/16/15  Yes [provider]  rosuvastatin (CRESTOR) 5 MG tablet Take 5 mg by mouth daily. 09/21/20  Yes [provider]  acetaminophen (TYLENOL) 325 MG tablet Take 650 mg by mouth every 6 (six) hours as needed.    [provider]    Family History Family History  Problem Relation Age of Onset   Prostate cancer Father    Dementia Mother    Diabetes Brother    Melanoma Other        Uncle   Colon cancer Neg Hx    Esophageal cancer Neg Hx    Rectal cancer Neg Hx    Stomach cancer Neg Hx     Social History Social History   Tobacco Use   Smoking status: Former    Packs/day: 0.25    Years: 3.00    Pack years: 0.75    Types: Cigarettes    Quit date: 05/23/2001     Years since quitting: 19.7   Smokeless tobacco: Never   Tobacco comments:    smoked socially not everyday thing.  Vaping Use   Vaping Use: Never used  Substance Use Topics   Alcohol use: Yes    Alcohol/week: 1.0 standard drink    Types: 1 Glasses of wine per week    Comment: occasional once a month   Drug use: No     Allergies   Patient has no known allergies.   Review of Systems Review of Systems  Constitutional:  Negative for appetite change, fatigue and fever.  HENT:  Negative for congestion.   Respiratory:  Negative for cough and shortness of breath.   Cardiovascular:  Negative for chest pain.  Gastrointestinal:  Positive for abdominal pain. Negative for constipation, diarrhea, nausea and vomiting.  Genitourinary:  Negative for dysuria, frequency and urgency.  Musculoskeletal:  Positive for back pain. Negative for gait problem.  Neurological:  Negative for weakness and numbness.    Physical Exam Triage Vital Signs ED Triage Vitals  Enc Vitals Group     BP 02/17/21 1106 140/85     Pulse Rate 02/17/21 1106 (!) 56     Resp 02/17/21 1106 18     Temp 02/17/21 1106 98.4 F (36.9 C)     Temp Source 02/17/21 1106 Oral     SpO2 02/17/21 1106 98 %     Weight 02/17/21 1104 185 lb (83.9 kg)     Height 02/17/21 1104 5' 3.5" (1.613 m)     Head Circumference --      Peak Flow --      Pain Score 02/17/21 1104 4     Pain Loc --      Pain Edu? --      Excl. in [place]? --    No data found.  Updated Vital Signs BP 140/85 (BP Location: Left Arm)   Pulse (!) 56   Temp 98.4 F (36.9 C) (Oral)   Resp 18   Ht 5' 3.5" (1.613 m)   Wt 185 lb (83.9 kg)   SpO2 98%   BMI 32.26 kg/m      Physical Exam Vitals and nursing note reviewed.  Constitutional:      General: She is not in acute distress.    Appearance: Normal appearance. She is not ill-appearing or toxic-appearing.  HENT:     Head: Normocephalic and atraumatic.     Nose: Nose normal.     Mouth/Throat:     Mouth:  Mucous membranes are moist.     Pharynx: Oropharynx is clear.  Eyes:     General: No scleral icterus.       Right eye: No discharge.        Left eye: No discharge.     Conjunctiva/sclera: Conjunctivae normal.  Cardiovascular:     Rate and Rhythm: Regular rhythm. Bradycardia present.     Heart sounds: Normal heart sounds.  Pulmonary:     Effort: Pulmonary effort is normal. No respiratory distress.     Breath sounds: Normal breath sounds.  Abdominal:     General: Bowel sounds are decreased.     Palpations: Abdomen is soft.     Tenderness: There is no abdominal tenderness. There is no right CVA tenderness or left CVA tenderness.  Musculoskeletal:     Cervical back: Neck supple.     Lumbar back: Tenderness (TTP left paralumbar muscles and left buttocks) present. Decreased range of motion. Negative right straight leg raise test and negative left straight leg raise test.  Skin:    General: Skin is dry.  Neurological:     General: No focal deficit present.     Mental Status: She is alert. Mental status is at baseline.     Motor: No weakness.     Gait: Gait normal.  Psychiatric:        Mood and Affect: Mood normal.        Behavior: Behavior normal.        Thought Content: Thought content normal.     UC Treatments / Results  Labs (all labs ordered are listed, but only abnormal results are displayed) Labs Reviewed  URINALYSIS, COMPLETE (UACMP) WITH MICROSCOPIC - Abnormal; Notable for the following components:      Result Value   Specific Gravity, Urine >1.030 (*)    Hgb urine dipstick TRACE (*)    Leukocytes,Ua SMALL (*)    Bacteria, UA FEW (*)    All other components within normal limits  COMPREHENSIVE METABOLIC PANEL - Abnormal; Notable for the following components:   Creatinine, Ser 1.28 (*)    GFR, Estimated 45 (*)    All other components within normal limits  URINE CULTURE  CBC WITH DIFFERENTIAL/PLATELET    EKG   Radiology DG Abdomen 1 View  Result Date:  02/17/2021 CLINICAL DATA:  Left lower quadrant pain EXAM: ABDOMEN - 1 VIEW COMPARISON:  KUB 05/03/2018 FINDINGS: There is a nonobstructive bowel gas pattern. There is no definite free intraperitoneal air. There is a mild to moderate colonic stool burden. There is no gross organomegaly or abnormal soft tissue calcification. Cholecystectomy clips and surgical material in the left pelvis are noted. The bones are unremarkable. IMPRESSION: Mild to moderate colonic stool burden. Otherwise, unremarkable KUB with no acute findings. Electronically Signed   By: Valetta Mole M.D.   On: 02/17/2021 12:14    Procedures Procedures (including critical care time)  Medications Ordered in UC Medications - No data to display  Initial Impression / Assessment and Plan / UC Course  I have reviewed the triage vital signs and the nursing notes.  Pertinent labs & imaging results that were available during my care of the patient were reviewed by me and considered in my medical decision making (see chart for details).  70 year old female presenting for left lower quadrant pain as well as left leg pain for the past 5 hours.  She does have chronic back pain which is worse on the left side presently.  Patient is concerned about the abdominal pain.  It is not associated with fever, fatigue, aches,  and/V/D or constipation.  No black or bloody stools.  No red flag signs or symptoms related to the back pain.  Vitals are stable.  Patient is well-appearing.  Exam today reveals soft abdomen with mildly decreased bowel sounds throughout.  No abdominal tenderness at this time.  No CVA tenderness.  She does have tenderness palpation of the left paralumbar muscles and left buttocks.  Negative straight leg raise.  Urinalysis obtained today as well as CBC, CMP and KUB.  Assessing for possible urinary tract infection, constipation, bowel obstruction, renal stone, or other signs of infection but advised patient symptoms could be related to her  back pain and lumbar radiculopathy.  CBC is normal.  CMP shows stable renal function compared to previous labs about 4 months ago.  UA does show greater than 1.030 specific gravity, trace hemoglobin and small leukocytes.  We will send urine for culture but patient not having any urinary symptoms.  Advised her that we could talk about potential treatment if she does grow bacteria and develops urinary symptoms.  KUB shows only mild to moderate stool burden in colon. Discussed all results with patient.  At this time, advised patient that her abdominal pain is likely a combination of discomfort related to the moderate stool burden.  Advised her to increase fluid intake and consider stool softener.  Also advised patient that her back pain is likely radiating pain to the abdomen since that is radiating to the leg.  Advised ibuprofen and Tylenol and relative rest.  Continue all at home meds.  Reviewed return and ED precautions with patient.  Final Clinical Impressions(s) / UC Diagnoses   Final diagnoses:  Left lower quadrant abdominal pain  Acute left-sided low back pain with left-sided sciatica  Abnormal urinalysis     Discharge Instructions      -X-ray shows a moderate amount of stool in your colon.  This could be part of the cause for your discomfort in the left lower quadrant.  Also on your urine test it appears that you are a little dehydrated so I would suggest increasing your fluid intake to help move things along. -Also on the urine test that show some white blood cells going to send it for culture.  We will call you if you need to start antibiotics.  If you start to have urinary symptoms, give [place] a call. -As we discussed, pain in your back can radiate to your lower abdomen.  Since you are having back pain on this side and leg pain on this side, it is most likely that your abdominal pain is due to the back discomfort.  I would advise taking your at home ibuprofen and Tylenol and resting.   You can also use a heating pad.  Sometimes, as we discussed, people may need to take a corticosteroid a stronger medication if the sciatic pain worsens.  Please return or see your PCP if that happens. -If at any point you have severe or worsening abdominal pain, stop having bowel movements, notice black or bloody stools, high fever, weakness or any acute worsening of symptoms, you should be reevaluated.  For any severe symptoms, please go to ED.     ED Prescriptions   None    PDMP not reviewed this encounter.   Danton Clap, PA-C 02/17/21 1239

## 2021-02-17 NOTE — ED Triage Notes (Signed)
Pt c/o LLQ abd pain since this morning. Pt reports last normal BM yesterday, denies constipation/diarrhea/nausea/emesis. Pt also denies urinary symptoms.

## 2021-02-17 NOTE — Telephone Encounter (Signed)
Noted  

## 2021-02-17 NOTE — Telephone Encounter (Signed)
[facility] - Client TELEPHONE ADVICE RECORD [facility] Patient Name: Rebekah Johnston Gender: Female DOB: 1950-10-27 Age: 70 Y 66 M 5 D Return Phone Number: [phone] (Primary) Address: City/ State/ Zip: [place]  [address] Client [facility] - Client Client Site [facility] - Day Physician Eliezer Lofts - MD Contact Type Call Who Is Calling Patient / Member / Family / Caregiver Call Type Triage / Clinical Relationship To Patient Self Return Phone Number [phone] (Primary) Chief Complaint Abdominal Pain Reason for Call Symptomatic / Request for [place] states that she has mild-moderate abdominal pain. Translation No Disp. Time [name] Time) Disposition Final User 02/17/2021 9:04:28 AM Attempt made - message left Emch, RN, Ronalee Belts 02/17/2021 9:21:12 AM Attempt made - line busy Emch, RN, Ronalee Belts 02/17/2021 9:21:03 AM FINAL ATTEMPT MADE - message left Yes Emch, RN, Mik

## 2021-02-17 NOTE — Discharge Instructions (Signed)
-  X-ray shows a moderate amount of stool in your colon.  This could be part of the cause for your discomfort in the left lower quadrant.  Also on your urine test it appears that you are a little dehydrated so I would suggest increasing your fluid intake to help move things along. -Also on the urine test that show some white blood cells going to send it for culture.  We will call you if you need to start antibiotics.  If you start to have urinary symptoms, give [place] a call. -As we discussed, pain in your back can radiate to your lower abdomen.  Since you are having back pain on this side and leg pain on this side, it is most likely that your abdominal pain is due to the back discomfort.  I would advise taking your at home ibuprofen and Tylenol and resting.  You can also use a heating pad.  Sometimes, as we discussed, people may need to take a corticosteroid a stronger medication if the sciatic pain worsens.  Please return or see your PCP if that happens. -If at any point you have severe or worsening abdominal pain, stop having bowel movements, notice black or bloody stools, high fever, weakness or any acute worsening of symptoms, you should be reevaluated.  For any severe symptoms, please go to ED.

## 2021-02-19 LAB — URINE CULTURE

## 2021-03-17 ENCOUNTER — Other Ambulatory Visit: Payer: Self-pay | Admitting: Family Medicine

## 2021-03-17 DIAGNOSIS — R0602 Shortness of breath: Secondary | ICD-10-CM

## 2021-03-21 DIAGNOSIS — M2041 Other hammer toe(s) (acquired), right foot: Secondary | ICD-10-CM | POA: Diagnosis not present

## 2021-03-21 DIAGNOSIS — M79672 Pain in left foot: Secondary | ICD-10-CM | POA: Diagnosis not present

## 2021-04-22 DIAGNOSIS — J9610 Chronic respiratory failure, unspecified whether with hypoxia or hypercapnia: Secondary | ICD-10-CM | POA: Diagnosis not present

## 2021-04-22 DIAGNOSIS — G4733 Obstructive sleep apnea (adult) (pediatric): Secondary | ICD-10-CM | POA: Diagnosis not present

## 2021-04-22 DIAGNOSIS — E785 Hyperlipidemia, unspecified: Secondary | ICD-10-CM | POA: Diagnosis not present

## 2021-04-22 DIAGNOSIS — F17200 Nicotine dependence, unspecified, uncomplicated: Secondary | ICD-10-CM | POA: Diagnosis not present

## 2021-04-28 DIAGNOSIS — M65342 Trigger finger, left ring finger: Secondary | ICD-10-CM | POA: Diagnosis not present

## 2021-05-22 ENCOUNTER — Other Ambulatory Visit: Payer: Self-pay | Admitting: Family Medicine

## 2021-05-24 DIAGNOSIS — I340 Nonrheumatic mitral (valve) insufficiency: Secondary | ICD-10-CM | POA: Diagnosis not present

## 2021-05-24 DIAGNOSIS — E782 Mixed hyperlipidemia: Secondary | ICD-10-CM | POA: Diagnosis not present

## 2021-05-24 DIAGNOSIS — R0602 Shortness of breath: Secondary | ICD-10-CM | POA: Diagnosis not present

## 2021-05-24 DIAGNOSIS — E669 Obesity, unspecified: Secondary | ICD-10-CM | POA: Diagnosis not present

## 2021-05-24 DIAGNOSIS — K219 Gastro-esophageal reflux disease without esophagitis: Secondary | ICD-10-CM | POA: Diagnosis not present

## 2021-06-08 ENCOUNTER — Other Ambulatory Visit: Payer: Self-pay

## 2021-06-08 ENCOUNTER — Ambulatory Visit
Admission: EM | Admit: 2021-06-08 | Discharge: 2021-06-08 | Disposition: A | Discharge status: Home / Self Care | Payer: PPO

## 2021-06-08 DIAGNOSIS — Z23 Encounter for immunization: Secondary | ICD-10-CM

## 2021-06-08 DIAGNOSIS — S91332A Puncture wound without foreign body, left foot, initial encounter: Secondary | ICD-10-CM

## 2021-06-08 MED ORDER — TETANUS-DIPHTHERIA TOXOIDS TD 5-2 LFU IM INJ
0.5000 mL | INJECTION | Freq: Once | INTRAMUSCULAR | Status: AC
  Administered 2021-06-08: 0.5 mL via INTRAMUSCULAR

## 2021-06-08 MED ORDER — CIPROFLOXACIN HCL 500 MG PO TABS: 500.0000 mg | ORAL_TABLET | Freq: Two times a day (BID) | ORAL | 0 refills | Status: DC

## 2021-06-08 MED ORDER — TETANUS-DIPHTH-ACELL PERTUSSIS 5-2.5-18.5 LF-MCG/0.5 IM SUSY: 0.5000 mL | PREFILLED_SYRINGE | Freq: Once | INTRAMUSCULAR | Status: DC

## 2021-06-08 NOTE — Discharge Instructions (Signed)
Today you have been given an injection to cover for the tetanus infection  Due to nails being somewhat dirty, you have been placed on oral antibiotic to help prevent infection  Take ciprofloxacin twice a day for the next 7 days, this medication may give you diarrhea, if this occurs it would subside after completion, you may take an over-the-counter probiotic to prevent this  You may cleanse daily with normal hygiene, pat dry and cover with Band-Aid  Please do not soak feet until area has completely healed  Please watch for signs of infection such as increased swelling, increased pain, drainage, fever, chills, if this occurs at any point please follow-up with urgent care soon as possible

## 2021-06-08 NOTE — ED Triage Notes (Signed)
Pt here with C/O of stepping on a nail a little bit ago. LTD 02/15/2017.

## 2021-06-08 NOTE — ED Provider Notes (Signed)
[facility]    CSN: [id number] Arrival date & time: 06/08/21  1731      History   Chief Complaint Chief Complaint  Patient presents with   Foot Injury    HPI Rebekah Johnston is a 71 y.o. female.   Patient presents for evaluation of a left foot wound occurring today after she stepped on a nail with a shoe on.  Nail is not currently in foot.  Site is bleeding.  Endorses some tenderness.  Range of motion intact.  Denies numbness, tingling, prior injury or trauma.  Past Medical History:  Diagnosis Date   Asthma    no inhalers   Bronchitis    taking meds now   Chronic kidney disease 2008   Stage G3b/A1   Depression    GERD (gastroesophageal reflux disease)    Glaucoma    no eye drops   Hyperlipidemia    no meds   Macular degeneration    Bil   Osteoarthritis     Patient Active Problem List   Diagnosis Date Noted   Acute midline thoracic back pain 04/13/2020   Cough 07/09/2018   ETD (Eustachian tube dysfunction), left 07/09/2018   Vitamin D deficiency 05/10/2017   Medicare annual wellness visit, subsequent 04/17/2017   Trochanteric bursitis of left hip 03/09/2017   Osteopenia 05/16/2016   Major depressive disorder, recurrent episode, moderate ([place]) 04/04/2016   Gastroesophageal reflux disease 04/04/2016   Low back pain 01/18/2016   Bilateral chronic knee pain 01/18/2016   Counseling regarding end of life decision making 03/18/2015   Chronic kidney disease (CKD) stage G3b/A1, moderately decreased glomerular filtration rate (GFR) between 30-44 mL/min/1.73 square meter and albuminuria creatinine ratio less than 30 mg/g ([place]) 08/20/2008   HYPERCHOLESTEROLEMIA 05/20/2008   ALLERGIC RHINITIS 04/16/2008   EXERCISE INDUCED ASTHMA 04/16/2008   OSTEOARTHRITIS 04/16/2008   PREDIABETES 25/85/2778   UMBILICAL HERNIORRHAPHY, HX OF 04/16/2008    Past Surgical History:  Procedure Laterality Date   BUNIONECTOMY     rt. foot   CHOLECYSTECTOMY     HERNIA REPAIR      left inguinal hernia   TONSILLECTOMY  1967    OB History   No obstetric history on file.      Home Medications    Prior to Admission medications   Medication Sig Start Date End Date Taking? Authorizing Provider  acetaminophen (TYLENOL) 325 MG tablet Take 650 mg by mouth every 6 (six) hours as needed.   Yes [provider]  ciprofloxacin (CIPRO) 500 MG tablet Take 1 tablet (500 mg total) by mouth 2 (two) times daily. 06/08/21  Yes Izumi Mixon R, NP  citalopram (CELEXA) 40 MG tablet TAKE 1 TABLET BY MOUTH EVERY DAY 05/22/21  Yes Bedsole, Amy E, MD  pantoprazole (PROTONIX) 40 MG tablet Take 40 mg by mouth daily. 02/16/15  Yes [provider]  rosuvastatin (CRESTOR) 5 MG tablet TAKE 1 TABLET BY MOUTH EVERY DAY 03/17/21  Yes Bedsole, Amy E, MD    Family History Family History  Problem Relation Age of Onset   Prostate cancer Father    Dementia Mother    Diabetes Brother    Melanoma Other        Uncle   Colon cancer Neg Hx    Esophageal cancer Neg Hx    Rectal cancer Neg Hx    Stomach cancer Neg Hx     Social History Social History   Tobacco Use   Smoking status: Former  Packs/day: 0.25    Years: 3.00    Pack years: 0.75    Types: Cigarettes    Quit date: 05/23/2001    Years since quitting: 20.0   Smokeless tobacco: Never   Tobacco comments:    smoked socially not everyday thing.  Vaping Use   Vaping Use: Never used  Substance Use Topics   Alcohol use: Yes    Alcohol/week: 1.0 standard drink    Types: 1 Glasses of wine per week    Comment: occasional once a month   Drug use: No     Allergies   Patient has no known allergies.   Review of Systems Review of Systems  Constitutional: Negative.   Respiratory: Negative.    Cardiovascular: Negative.   Skin:  Positive for wound. Negative for color change, pallor and rash.  Neurological: Negative.     Physical Exam Triage Vital Signs ED Triage Vitals  Enc Vitals Group     BP 06/08/21  1743 132/85     Pulse Rate 06/08/21 1743 62     Resp 06/08/21 1743 18     Temp 06/08/21 1743 98.6 F (37 C)     Temp Source 06/08/21 1743 Oral     SpO2 06/08/21 1743 100 %     Weight 06/08/21 1742 190 lb (86.2 kg)     Height 06/08/21 1742 5\' 4"  (1.626 m)     Head Circumference --      Peak Flow --      Pain Score 06/08/21 1741 4     Pain Loc --      Pain Edu? --      Excl. in [place]? --    No data found.  Updated Vital Signs BP 132/85 (BP Location: Left Arm)    Pulse 62    Temp 98.6 F (37 C) (Oral)    Resp 18    Ht 5\' 4"  (1.626 m)    Wt 190 lb (86.2 kg)    SpO2 100%    BMI 32.61 kg/m   Visual Acuity Right Eye Distance:   Left Eye Distance:   Bilateral Distance:    Right Eye Near:   Left Eye Near:    Bilateral Near:     Physical Exam Constitutional:      Appearance: Normal appearance.  HENT:     Head: Normocephalic.  Eyes:     Extraocular Movements: Extraocular movements intact.  Pulmonary:     Effort: Pulmonary effort is normal.  Musculoskeletal:       Feet:  Feet:     Comments: Skin 0.5 cm puncture wound present on medial plantar aspect of the left foot, minimal bloody drainage, 2+ pedal pulse, sensation intact, capillary refill less than 3 Skin:    General: Skin is warm and dry.  Neurological:     Mental Status: She is alert and oriented to person, place, and time. Mental status is at baseline.  Psychiatric:        Mood and Affect: Mood normal.        Behavior: Behavior normal.     UC Treatments / Results  Labs (all labs ordered are listed, but only abnormal results are displayed) Labs Reviewed - No data to display  EKG   Radiology No results found.  Procedures Procedures (including critical care time)  Medications Ordered in UC Medications  tetanus & diphtheria toxoids (adult) (TENIVAC) injection 0.5 mL (has no administration in time range)    Initial Impression / Assessment and Plan /  UC Course  I have reviewed the triage vital signs and  the nursing notes.  Pertinent labs & imaging results that were available during my care of the patient were reviewed by me and considered in my medical decision making (see chart for details).  Puncture wound to foot, left, initial encounter  Site cleaned by nursing staff, Band-Aid applied, tetanus booster given, last Tdap 5 years ago,, due to puncture object being female, will place on prophylactic antibiotic, ciprofloxacin 7-day course prescribed, discussed use of a probiotic if diarrhea occurs, patient to watch for signs of infection, may return to urgent care as needed Final Clinical Impressions(s) / UC Diagnoses   Final diagnoses:  Puncture wound to foot, left, initial encounter     Discharge Instructions      Today you have been given an injection to cover for the tetanus infection  Due to nails being somewhat dirty, you have been placed on oral antibiotic to help prevent infection  Take ciprofloxacin twice a day for the next 7 days, this medication may give you diarrhea, if this occurs it would subside after completion, you may take an over-the-counter probiotic to prevent this  You may cleanse daily with normal hygiene, pat dry and cover with Band-Aid  Please do not soak feet until area has completely healed  Please watch for signs of infection such as increased swelling, increased pain, drainage, fever, chills, if this occurs at any point please follow-up with urgent care soon as possible   ED Prescriptions     Medication Sig Dispense Auth. Provider   ciprofloxacin (CIPRO) 500 MG tablet Take 1 tablet (500 mg total) by mouth 2 (two) times daily. 14 tablet Bailee Metter, Leitha Schuller, NP      PDMP not reviewed this encounter.   Hans Eden, NP 06/08/21 [phone]

## 2021-06-10 DIAGNOSIS — R0602 Shortness of breath: Secondary | ICD-10-CM | POA: Diagnosis not present

## 2021-06-13 DIAGNOSIS — E782 Mixed hyperlipidemia: Secondary | ICD-10-CM | POA: Diagnosis not present

## 2021-06-13 DIAGNOSIS — I340 Nonrheumatic mitral (valve) insufficiency: Secondary | ICD-10-CM | POA: Diagnosis not present

## 2021-06-13 DIAGNOSIS — E669 Obesity, unspecified: Secondary | ICD-10-CM | POA: Diagnosis not present

## 2021-06-13 DIAGNOSIS — I519 Heart disease, unspecified: Secondary | ICD-10-CM | POA: Diagnosis not present

## 2021-06-13 DIAGNOSIS — K219 Gastro-esophageal reflux disease without esophagitis: Secondary | ICD-10-CM | POA: Diagnosis not present

## 2021-06-22 DIAGNOSIS — R059 Cough, unspecified: Secondary | ICD-10-CM | POA: Diagnosis not present

## 2021-06-24 ENCOUNTER — Other Ambulatory Visit: Payer: Self-pay

## 2021-06-24 ENCOUNTER — Ambulatory Visit
Admission: RE | Admit: 2021-06-24 | Discharge: 2021-06-24 | Disposition: A | Discharge status: Home / Self Care | Payer: PPO | Source: Ambulatory Visit | Attending: Emergency Medicine | Admitting: Emergency Medicine

## 2021-06-24 ENCOUNTER — Ambulatory Visit: Payer: Self-pay

## 2021-06-24 ENCOUNTER — Telehealth: Payer: Self-pay

## 2021-06-24 VITALS — BP 123/90 | HR 71 | Temp 99.5°F | Resp 14 | Ht 64.0 in | Wt 180.0 lb

## 2021-06-24 DIAGNOSIS — U071 COVID-19: Secondary | ICD-10-CM | POA: Diagnosis not present

## 2021-06-24 LAB — RESP PANEL BY RT-PCR (FLU A&B, COVID) ARPGX2
Influenza A by PCR: NEGATIVE
Influenza B by PCR: NEGATIVE
SARS Coronavirus 2 by RT PCR: POSITIVE — AB

## 2021-06-24 LAB — GROUP A STREP BY PCR: Group A Strep by PCR: NOT DETECTED

## 2021-06-24 MED ORDER — IPRATROPIUM BROMIDE 0.06 % NA SOLN: 2.0000 | Freq: Four times a day (QID) | NASAL | 12 refills | Status: DC

## 2021-06-24 MED ORDER — PROMETHAZINE-DM 6.25-15 MG/5ML PO SYRP: 5.0000 mL | ORAL_SOLUTION | Freq: Four times a day (QID) | ORAL | 0 refills | Status: DC | PRN

## 2021-06-24 MED ORDER — BENZONATATE 100 MG PO CAPS: 200.0000 mg | ORAL_CAPSULE | Freq: Three times a day (TID) | ORAL | 0 refills | Status: DC

## 2021-06-24 MED ORDER — MOLNUPIRAVIR EUA 200MG CAPSULE: 4.0000 | ORAL_CAPSULE | Freq: Two times a day (BID) | ORAL | 0 refills | Status: AC

## 2021-06-24 NOTE — Discharge Instructions (Addendum)
You will have to quarantine for 5 days from the start of your symptoms.  After 5 days you can break quarantine if your symptoms have improved and you have not had a fever for 24 hours without taking Tylenol or ibuprofen.  Use over-the-counter Tylenol and ibuprofen as needed for body aches and fever.  Use the Atrovent nasal spray, 2 squirts in each nostril every 6 hours, as needed for runny nose and postnasal drip.  Use the Tessalon Perles every 8 hours during the day.  Take them with a small sip of water.  They may give you some numbness to the base of your tongue or a metallic taste in your mouth, this is normal.  Use the Promethazine DM cough syrup at bedtime for cough and congestion.  It will make you drowsy so do not take it during the day.  Take the molnupiravir twice daily for 5 days for treatment of COVID-19  If you develop any increased shortness of breath-especially at rest, you are unable to speak in full sentences, or is a late sign your lips are turning blue you need to go the ER for evaluation.  

## 2021-06-24 NOTE — ED Triage Notes (Signed)
Patient states that she recently traveled out of town.  Patient reports cough that started on Monday.  Patient took covid tests on Tuesday and Wed and both were negative.  Patient reports her fever started on Tuesday.

## 2021-06-24 NOTE — ED Provider Notes (Signed)
[facility]    CSN: [id number] Arrival date & time: 06/24/21  1547      History   Chief Complaint Chief Complaint  Patient presents with   Appointment   Fever   Cough    HPI Rebekah Johnston is a 71 y.o. female.   HPI  71 year old female here for evaluation of respiratory complaints.  Patient ports that she recently traveled out of town and then 5 days ago she developed a cough followed by a fever 4 days ago.  She reports a Tmax of 101.9.  She also endorses runny nose and nasal congestion, and intermittent productive cough, intermittent wheezing, left ear pain, sore throat, body aches, and headache.  She denies shortness of breath or GI complaints.  Past Medical History:  Diagnosis Date   Asthma    no inhalers   Bronchitis    taking meds now   Chronic kidney disease 2008   Stage G3b/A1   Depression    GERD (gastroesophageal reflux disease)    Glaucoma    no eye drops   Hyperlipidemia    no meds   Macular degeneration    Bil   Osteoarthritis     Patient Active Problem List   Diagnosis Date Noted   Acute midline thoracic back pain 04/13/2020   Cough 07/09/2018   ETD (Eustachian tube dysfunction), left 07/09/2018   Vitamin D deficiency 05/10/2017   Medicare annual wellness visit, subsequent 04/17/2017   Trochanteric bursitis of left hip 03/09/2017   Osteopenia 05/16/2016   Major depressive disorder, recurrent episode, moderate ([place]) 04/04/2016   Gastroesophageal reflux disease 04/04/2016   Low back pain 01/18/2016   Bilateral chronic knee pain 01/18/2016   Counseling regarding end of life decision making 03/18/2015   Chronic kidney disease (CKD) stage G3b/A1, moderately decreased glomerular filtration rate (GFR) between 30-44 mL/min/1.73 square meter and albuminuria creatinine ratio less than 30 mg/g ([place]) 08/20/2008   HYPERCHOLESTEROLEMIA 05/20/2008   ALLERGIC RHINITIS 04/16/2008   EXERCISE INDUCED ASTHMA 04/16/2008   OSTEOARTHRITIS  04/16/2008   PREDIABETES 38/18/2993   UMBILICAL HERNIORRHAPHY, HX OF 04/16/2008    Past Surgical History:  Procedure Laterality Date   BUNIONECTOMY     rt. foot   CHOLECYSTECTOMY     HERNIA REPAIR     left inguinal hernia   TONSILLECTOMY  1967    OB History   No obstetric history on file.      Home Medications    Prior to Admission medications   Medication Sig Start Date End Date Taking? Authorizing Provider  benzonatate (TESSALON) 100 MG capsule Take 2 capsules (200 mg total) by mouth every 8 (eight) hours. 06/24/21  Yes Margarette Canada, NP  citalopram (CELEXA) 40 MG tablet TAKE 1 TABLET BY MOUTH EVERY DAY 05/22/21  Yes Bedsole, Amy E, MD  ipratropium (ATROVENT) 0.06 % nasal spray Place 2 sprays into both nostrils 4 (four) times daily. 06/24/21  Yes Margarette Canada, NP  molnupiravir EUA (LAGEVRIO) 200 mg CAPS capsule Take 4 capsules (800 mg total) by mouth 2 (two) times daily for 5 days. 06/24/21 06/29/21 Yes Margarette Canada, NP  pantoprazole (PROTONIX) 40 MG tablet Take 40 mg by mouth daily. 02/16/15  Yes [provider]  promethazine-dextromethorphan (PROMETHAZINE-DM) 6.25-15 MG/5ML syrup Take 5 mLs by mouth 4 (four) times daily as needed. 06/24/21  Yes Margarette Canada, NP  rosuvastatin (CRESTOR) 5 MG tablet TAKE 1 TABLET BY MOUTH EVERY DAY 03/17/21  Yes Bedsole, Amy E, MD  spironolactone (ALDACTONE) 25 MG  tablet Take 12.5 mg by mouth daily. 06/20/21  Yes [provider]  acetaminophen (TYLENOL) 325 MG tablet Take 650 mg by mouth every 6 (six) hours as needed.    [provider]    Family History Family History  Problem Relation Age of Onset   Prostate cancer Father    Dementia Mother    Diabetes Brother    Melanoma Other        Uncle   Colon cancer Neg Hx    Esophageal cancer Neg Hx    Rectal cancer Neg Hx    Stomach cancer Neg Hx     Social History Social History   Tobacco Use   Smoking status: Former    Packs/day: 0.25    Years: 3.00    Pack  years: 0.75    Types: Cigarettes    Quit date: 05/23/2001    Years since quitting: 20.1   Smokeless tobacco: Never   Tobacco comments:    smoked socially not everyday thing.  Vaping Use   Vaping Use: Never used  Substance Use Topics   Alcohol use: Yes    Alcohol/week: 1.0 standard drink    Types: 1 Glasses of wine per week    Comment: occasional once a month   Drug use: No     Allergies   Patient has no known allergies.   Review of Systems Review of Systems  Constitutional:  Positive for fever. Negative for activity change and appetite change.  HENT:  Positive for congestion, ear pain, rhinorrhea and sore throat.   Respiratory:  Positive for cough and wheezing. Negative for shortness of breath.   Gastrointestinal:  Negative for diarrhea, nausea and vomiting.  Musculoskeletal:  Positive for arthralgias and myalgias.  Skin:  Negative for rash.  Neurological:  Positive for headaches.  Hematological: Negative.   Psychiatric/Behavioral: Negative.      Physical Exam Triage Vital Signs ED Triage Vitals  Enc Vitals Group     BP 06/24/21 1606 123/90     Pulse Rate 06/24/21 1606 71     Resp 06/24/21 1606 14     Temp 06/24/21 1606 99.5 F (37.5 C)     Temp Source 06/24/21 1606 Oral     SpO2 06/24/21 1606 98 %     Weight 06/24/21 1602 180 lb (81.6 kg)     Height 06/24/21 1602 5\' 4"  (1.626 m)     Head Circumference --      Peak Flow --      Pain Score 06/24/21 1602 7     Pain Loc --      Pain Edu? --      Excl. in [place]? --    No data found.  Updated Vital Signs BP 123/90 (BP Location: Right Arm)    Pulse 71    Temp 99.5 F (37.5 C) (Oral)    Resp 14    Ht 5\' 4"  (1.626 m)    Wt 180 lb (81.6 kg)    SpO2 98%    BMI 30.90 kg/m   Visual Acuity Right Eye Distance:   Left Eye Distance:   Bilateral Distance:    Right Eye Near:   Left Eye Near:    Bilateral Near:     Physical Exam Vitals and nursing note reviewed.  Constitutional:      Appearance: Normal appearance.  She is ill-appearing.  HENT:     Head: Normocephalic and atraumatic.     Right Ear: Tympanic membrane, ear canal and external  ear normal. There is no impacted cerumen.     Left Ear: Tympanic membrane, ear canal and external ear normal. There is no impacted cerumen.     Nose: Congestion and rhinorrhea present.     Mouth/Throat:     Mouth: Mucous membranes are moist.     Pharynx: Oropharynx is clear. Posterior oropharyngeal erythema present.  Cardiovascular:     Rate and Rhythm: Normal rate and regular rhythm.     Pulses: Normal pulses.     Heart sounds: Normal heart sounds. No murmur heard.   No friction rub. No gallop.  Pulmonary:     Effort: Pulmonary effort is normal.     Breath sounds: Normal breath sounds. No wheezing, rhonchi or rales.  Musculoskeletal:     Cervical back: Normal range of motion and neck supple.  Lymphadenopathy:     Cervical: No cervical adenopathy.  Skin:    General: Skin is warm and dry.     Capillary Refill: Capillary refill takes less than 2 seconds.     Findings: No erythema or rash.  Neurological:     General: No focal deficit present.     Mental Status: She is alert and oriented to person, place, and time.  Psychiatric:        Mood and Affect: Mood normal.        Behavior: Behavior normal.        Thought Content: Thought content normal.        Judgment: Judgment normal.     UC Treatments / Results  Labs (all labs ordered are listed, but only abnormal results are displayed) Labs Reviewed  RESP PANEL BY RT-PCR (FLU A&B, COVID) ARPGX2 - Abnormal; Notable for the following components:      Result Value   SARS Coronavirus 2 by RT PCR POSITIVE (*)    All other components within normal limits  GROUP A STREP BY PCR    EKG   Radiology No results found.  Procedures Procedures (including critical care time)  Medications Ordered in UC Medications - No data to display  Initial Impression / Assessment and Plan / UC Course  I have reviewed the  triage vital signs and the nursing notes.  Pertinent labs & imaging results that were available during my care of the patient were reviewed by me and considered in my medical decision making (see chart for details).  Patient is an ill-appearing 71 year old female here for evaluation of influenza/COVID-like symptoms that began 4 days ago as outlined in HPI above.  The symptoms developed after she gone out of town but she is unaware of any other sick contacts.  She reports her Tmax of 101.9.  She also notices runny nose, nasal congestion, and intermittently productive cough, some wheezing when she lays down, left ear pain, sore throat, body aches, and headache.  She states she did have 1 episode of diarrhea after eating some soup that did not taste right but she has not had diarrhea as a matter of course throughout this illness.  On exam patient has pearly-gray tympanic membranes bilaterally with normal light reflex and clear external auditory canals.  Nasal mucosa is erythematous and edematous with clear discharge in both nares.  Oropharyngeal exam reveals mild posterior oropharyngeal erythema with clear postnasal drip.  No exudate noted.  Tonsils are surgically absent.  No cervical lymphadenopathy appreciated exam.  Cardiopulmonary exam reveals clear lung sounds in all fields.  Will collect respiratory triplex panel to look for COVID or influenza and strep  PCR was collected at triage.  Patient has a significant history to include asthma, chronic kidney disease, prediabetes and GERD.  If patient is positive for COVID and she is well within the timeframe to start antiviral therapy.  If she test positive for influenza I will consider starting her on Tamiflu given her significant past medical history and increased risk factors.  Respiratory triplex panel was positive for COVID.  Strep PCR is negative.  We will discharge patient home on molnupiravir twice daily for 5 days for treatment of COVID-19.  We will  also give her Atrovent nasal spray to help with the runny nose and nasal congestion, Tessalon Perles help with cough during the day, and Promethazine DM to help with cough at nighttime.   Final Clinical Impressions(s) / UC Diagnoses   Final diagnoses:  [id number]     Discharge Instructions      You will have to quarantine for 5 days from the start of your symptoms.  After 5 days you can break quarantine if your symptoms have improved and you have not had a fever for 24 hours without taking Tylenol or ibuprofen.  Use over-the-counter Tylenol and ibuprofen as needed for body aches and fever.  Use the Atrovent nasal spray, 2 squirts in each nostril every 6 hours, as needed for runny nose and postnasal drip.  Use the Tessalon Perles every 8 hours during the day.  Take them with a small sip of water.  They may give you some numbness to the base of your tongue or a metallic taste in your mouth, this is normal.  Use the Promethazine DM cough syrup at bedtime for cough and congestion.  It will make you drowsy so do not take it during the day.  Take the molnupiravir twice daily for 5 days for treatment of COVID-19.  If you develop any increased shortness of breath-especially at rest, you are unable to speak in full sentences, or is a late sign your lips are turning blue you need to go the ER for evaluation.      ED Prescriptions     Medication Sig Dispense Auth. Provider   molnupiravir EUA (LAGEVRIO) 200 mg CAPS capsule Take 4 capsules (800 mg total) by mouth 2 (two) times daily for 5 days. 40 capsule Margarette Canada, NP   benzonatate (TESSALON) 100 MG capsule Take 2 capsules (200 mg total) by mouth every 8 (eight) hours. 21 capsule Margarette Canada, NP   ipratropium (ATROVENT) 0.06 % nasal spray Place 2 sprays into both nostrils 4 (four) times daily. 15 mL Margarette Canada, NP   promethazine-dextromethorphan (PROMETHAZINE-DM) 6.25-15 MG/5ML syrup Take 5 mLs by mouth 4 (four) times daily as needed. 118  mL Margarette Canada, NP      PDMP not reviewed this encounter.   Margarette Canada, NP 06/24/21 1712

## 2021-06-24 NOTE — Telephone Encounter (Signed)
I spoke with pt; pt said symptoms started on 06/21/21 with chills and body aches, H/A and prod cough  with clear phlegm. Now pt continues with prod cough with clear phlegm, SOB upon exertion; some wheezing at nighttime. Pt has had slight watery diarrhea with last diarrhea on 06/23/21. Vomiting on and off after bad coughing episode. Pt has sinus congestion and s/t. No fever. Pt just returned from [place] on plane on 06/20/21.pt said she has not felt this bad in a long time. Pt took home covid  test on 06/21/21 and 06/22/21 which were both neg.Pt said she does not feel like driving and will have her son pick her up to take to UC. I asked if pt felt bad  enough to go to ED and she declined. I scheduled appt at [place] 06/24/21 at 11:15. Pt will drink plenty of fluids and get rest.UC & ED precautions given and pt voiced understanding. Pt appreciative and sending note to DR Diona Browner and Butch Penny CMA.

## 2021-06-24 NOTE — Telephone Encounter (Signed)
Pt called back and cannot get a ride to [facility] in [place] and wants appt changed to [facility] in [place]. I cancelled the UC in [place] and scheduled pt an appt at [place] 06/24/21 at 4 PM. Again reviewed UC & ED precautions with pt and pt voiced understanding.

## 2021-06-24 NOTE — Telephone Encounter (Signed)
[facility] - Client TELEPHONE ADVICE RECORD [facility] Patient Name: Rebekah Johnston Gender: Female DOB: 05-Aug-1950 Age: 71 Y 52 M 9 D Return Phone Number: [phone] (Primary) Address: City/ State/ Zip: [place]  [address] Client [facility] - Client Client Site Long - Day Provider Eliezer Lofts - MD Contact Type Call Who Is Calling Patient / Member / Family / Caregiver Call Type Triage / Clinical Relationship To Patient Self Return Phone Number [phone] (Primary) Chief Complaint Sore Throat Reason for Call Symptomatic / Request for Health Information Initial Comment Caller c/o fever (unsure of temperature) and sore throat. Translation No Nurse Assessment Nurse: D'Heur Lucia Gaskins, RN, Adrienne Date/Time (Eastern Time): 06/24/2021 9:20:23 AM Confirm and document reason for call. If symptomatic, describe symptoms. ---Caller states she has sore throat and cough. S/S started Monday. No fever today. Does the patient have any new or worsening symptoms? ---Yes Will a triage be completed? ---Yes Related visit to physician within the last 2 weeks? ---No Does the PT have any chronic conditions? (i.e. diabetes, asthma, this includes High risk factors for pregnancy, etc.) ---Yes List chronic conditions. ---GERD, Is this a behavioral health or substance abuse call? ---No Guidelines Guideline Title Affirmed Question Affirmed Notes Nurse Date/Time (Eastern Time) COVID-19 - Diagnosed or Suspected [1] HIGH RISK for severe COVID complications (e.g., weak immune system, age > 48 years, obesity with BMI 30 or higher, pregnant, chronic lung disease or other chronic medical condition) AND [2] COVID symptoms D'Heur Lucia Gaskins, RN, Vincente Liberty 06/24/2021 9:28:20 AM PLEASE NOTE: All timestamps contained within this report are represented as [place] Standard Time. CONFIDENTIALTY NOTICE: This fax  transmission is intended only for the addressee. It contains information that is legally privileged, confidential or otherwise protected from use or disclosure. If you are not the intended recipient, you are strictly prohibited from reviewing, disclosing, copying using or disseminating any of this information or taking any action in reliance on or regarding this information. If you have received this fax in error, please notify us immediately by telephone so that we can arrange for its return to [place]. Phone: [phone], Toll-Free: [phone], Fax: [phone] Page: 2 of 2 Call Id: [id number] Guidelines Guideline Title Affirmed Question Affirmed Notes Nurse Date/Time [name] Time) (e.g., cough, fever) (Exceptions: Already seen by PCP and no new or worsening symptoms.) Disp. Time [name] Time) Disposition Final User 06/24/2021 8:43:42 AM Attempt made - message left D'Heur Macky Lower 06/24/2021 8:58:53 AM Attempt made - message left D'Heur Macky Lower 06/24/2021 9:32:41 AM Call PCP within 24 Hours Yes D'Heur Lucia Gaskins, RN, Vincente Liberty Caller Disagree/Comply Comply Caller Understands Yes PreDisposition Call Doctor Care Advice Given Per Guideline CALL PCP WITHIN 24 HOURS: * You need to discuss this with your doctor (or NP/PA) within the next 24 hours. * Feeling dehydrated: Drink extra liquids. If the air in your home is dry, use a humidifier. * HOME REMEDY - HONEY: This old home remedy has been shown to help decrease coughing at night. The adult dosage is 2 teaspoons (10 ml) at bedtime. CALL BACK IF: * You become worse CARE ADVICE given per COVID-19 - DIAGNOSED OR SUSPECTED (Adult) guideline. Comments User: Vincente Liberty, D'Heur Lucia Gaskins, RN Date/Time [name] Time): 06/24/2021 9:29:47 AM Caller has activity-induced asthm

## 2021-06-24 NOTE — Telephone Encounter (Signed)
Agree with plan 

## 2021-07-06 ENCOUNTER — Encounter: Payer: Self-pay | Admitting: Gastroenterology

## 2021-08-15 DIAGNOSIS — I519 Heart disease, unspecified: Secondary | ICD-10-CM | POA: Diagnosis not present

## 2021-08-15 DIAGNOSIS — E782 Mixed hyperlipidemia: Secondary | ICD-10-CM | POA: Diagnosis not present

## 2021-08-15 DIAGNOSIS — E669 Obesity, unspecified: Secondary | ICD-10-CM | POA: Diagnosis not present

## 2021-08-15 DIAGNOSIS — I340 Nonrheumatic mitral (valve) insufficiency: Secondary | ICD-10-CM | POA: Diagnosis not present

## 2021-08-15 DIAGNOSIS — K219 Gastro-esophageal reflux disease without esophagitis: Secondary | ICD-10-CM | POA: Diagnosis not present

## 2021-09-16 ENCOUNTER — Other Ambulatory Visit: Payer: Self-pay | Admitting: Family Medicine

## 2021-09-16 DIAGNOSIS — R0602 Shortness of breath: Secondary | ICD-10-CM

## 2021-09-16 NOTE — Telephone Encounter (Signed)
Please schedule Medicare Wellness and then transfer to our office to schedule CPE with Dr. Diona Browner with fasting labs for July 2023.

## 2021-10-11 ENCOUNTER — Encounter: Payer: Self-pay | Admitting: Family Medicine

## 2021-10-11 ENCOUNTER — Ambulatory Visit (INDEPENDENT_AMBULATORY_CARE_PROVIDER_SITE_OTHER): Payer: PPO | Admitting: Family Medicine

## 2021-10-11 VITALS — BP 110/70 | HR 56 | Temp 97.8°F | Ht 63.0 in | Wt 184.3 lb

## 2021-10-11 DIAGNOSIS — M5416 Radiculopathy, lumbar region: Secondary | ICD-10-CM

## 2021-10-11 MED ORDER — PREDNISONE 20 MG PO TABS: ORAL_TABLET | ORAL | 0 refills | Status: DC

## 2021-10-11 NOTE — Assessment & Plan Note (Signed)
Acute, poor control No current indication for imaging of spine, no vertebral pain or red flags. Most likely secondary to degenerative disc disease and lumbar spine resulting in irritated sciatic nerve.  We will start prednisone taper over 10 days.  She will start heat, massage and home physical therapy.  Information given.  If she is not improving in the next 2 weeks she will call for reevaluation.

## 2021-10-11 NOTE — Progress Notes (Signed)
Patient ID: OCTIVIA CANION, female    DOB: Jun 22, 1950, 71 y.o.   MRN: [id number]  This visit was conducted in person.  BP 110/70   Pulse (!) 56   Temp 97.8 F (36.6 C) (Oral)   Ht '5\' 3"'$  (1.6 m)   Wt 184 lb 5 oz (83.6 kg)   SpO2 98%   BMI 32.65 kg/m    CC:  Chief Complaint  Patient presents with   Sciatica    Left Side     Subjective:   HPI: Rebekah Johnston is a 71 y.o. female  with history of  trochanteric bursitis and low back pain presenting on 10/11/2021 for low back pain, left buttock pain  She reports  new onset pain in left buttock.Marland Kitchen off and on for several weeks.  Pain in left low back as well.  Radiation of pain into thigh.Marland Kitchen ache  Increase in pain when reaching forward, bending.  No fall, no change in activity, she does lift a lot/moving furniture.  No new numbness no new weaknmess. No fever.   No rectal pain, no issue with BM.   She has tried tylenol for pain.. 2 x 650 mg  off and on.  Relevant past medical, surgical, family and social history reviewed and updated as indicated. Interim medical history since our last visit reviewed. Allergies and medications reviewed and updated. Outpatient Medications Prior to Visit  Medication Sig Dispense Refill   acetaminophen (TYLENOL) 325 MG tablet Take 650 mg by mouth every 6 (six) hours as needed.     citalopram (CELEXA) 40 MG tablet TAKE 1 TABLET BY MOUTH EVERY DAY 90 tablet 1   ipratropium (ATROVENT) 0.06 % nasal spray Place 2 sprays into both nostrils 4 (four) times daily. 15 mL 12   pantoprazole (PROTONIX) 40 MG tablet Take 40 mg by mouth daily.  11   rosuvastatin (CRESTOR) 5 MG tablet TAKE 1 TABLET BY MOUTH EVERY DAY 90 tablet 0   spironolactone (ALDACTONE) 25 MG tablet Take 12.5 mg by mouth daily.     benzonatate (TESSALON) 100 MG capsule Take 2 capsules (200 mg total) by mouth every 8 (eight) hours. 21 capsule 0   promethazine-dextromethorphan (PROMETHAZINE-DM) 6.25-15 MG/5ML syrup Take 5 mLs by mouth 4  (four) times daily as needed. 118 mL 0   No facility-administered medications prior to visit.     Per HPI unless specifically indicated in ROS section below Review of Systems  Constitutional:  Negative for fatigue and fever.  HENT:  Negative for ear pain.   Eyes:  Negative for pain.  Respiratory:  Negative for chest tightness and shortness of breath.   Cardiovascular:  Negative for chest pain, palpitations and leg swelling.  Gastrointestinal:  Negative for abdominal pain.  Genitourinary:  Negative for dysuria.  Musculoskeletal:  Positive for back pain.   Objective:  BP 110/70   Pulse (!) 56   Temp 97.8 F (36.6 C) (Oral)   Ht '5\' 3"'$  (1.6 m)   Wt 184 lb 5 oz (83.6 kg)   SpO2 98%   BMI 32.65 kg/m   Wt Readings from Last 3 Encounters:  10/11/21 184 lb 5 oz (83.6 kg)  06/24/21 180 lb (81.6 kg)  06/08/21 190 lb (86.2 kg)      Physical Exam Constitutional:      General: She is not in acute distress.    Appearance: Normal appearance. She is well-developed. She is not ill-appearing or toxic-appearing.  HENT:     Head: Normocephalic.  Right Ear: Hearing, tympanic membrane, ear canal and external ear normal. Tympanic membrane is not erythematous, retracted or bulging.     Left Ear: Hearing, tympanic membrane, ear canal and external ear normal. Tympanic membrane is not erythematous, retracted or bulging.     Nose: No mucosal edema or rhinorrhea.     Right Sinus: No maxillary sinus tenderness or frontal sinus tenderness.     Left Sinus: No maxillary sinus tenderness or frontal sinus tenderness.     Mouth/Throat:     Pharynx: Uvula midline.  Eyes:     General: Lids are normal. Lids are everted, no foreign bodies appreciated.     Conjunctiva/sclera: Conjunctivae normal.     Pupils: Pupils are equal, round, and reactive to light.  Neck:     Thyroid: No thyroid mass or thyromegaly.     Vascular: No carotid bruit.     Trachea: Trachea normal.  Cardiovascular:     Rate and  Rhythm: Normal rate and regular rhythm.     Pulses: Normal pulses.     Heart sounds: Normal heart sounds, S1 normal and S2 normal. No murmur heard.    No friction rub. No gallop.  Pulmonary:     Effort: Pulmonary effort is normal. No tachypnea or respiratory distress.     Breath sounds: Normal breath sounds. No decreased breath sounds, wheezing, rhonchi or rales.  Abdominal:     General: Bowel sounds are normal.     Palpations: Abdomen is soft.     Tenderness: There is no abdominal tenderness.  Musculoskeletal:     Cervical back: Normal, normal range of motion and neck supple.     Thoracic back: Normal.     Lumbar back: Tenderness present. No spasms or bony tenderness. Decreased range of motion. Positive left straight leg raise test. Negative right straight leg raise test.  Skin:    General: Skin is warm and dry.     Findings: No rash.  Neurological:     Mental Status: She is alert.  Psychiatric:        Mood and Affect: Mood is not anxious or depressed.        Speech: Speech normal.        Behavior: Behavior normal. Behavior is cooperative.        Thought Content: Thought content normal.        Judgment: Judgment normal.       Results for orders placed or performed during the hospital encounter of 06/24/21  Group A Strep by PCR   Specimen: Throat; Sterile Swab  Result Value Ref Range   Group A Strep by PCR NOT DETECTED NOT DETECTED  Resp Panel by RT-PCR (Flu A&B, Covid) Nasopharyngeal Swab   Specimen: Nasopharyngeal Swab; Nasopharyngeal(NP) swabs in vial transport medium  Result Value Ref Range   SARS Coronavirus 2 by RT PCR POSITIVE (A) NEGATIVE   Influenza A by PCR NEGATIVE NEGATIVE   Influenza B by PCR NEGATIVE NEGATIVE     COVID 19 screen:  No recent travel or known exposure to COVID19 The patient denies respiratory symptoms of COVID 19 at this time. The importance of social distancing was discussed today.   Assessment and Plan    Problem List Items Addressed  This Visit     Lumbar back pain with radiculopathy affecting left lower extremity - Primary    Acute, poor control No current indication for imaging of spine, no vertebral pain or red flags. Most likely secondary to degenerative disc disease and  lumbar spine resulting in irritated sciatic nerve.  We will start prednisone taper over 10 days.  She will start heat, massage and home physical therapy.  Information given.  If she is not improving in the next 2 weeks she will call for reevaluation.      Relevant Medications   predniSONE (DELTASONE) 20 MG tablet     Eliezer Lofts, MD

## 2021-10-11 NOTE — Patient Instructions (Addendum)
Complete course of prednisone .  Can use tylenol for pain.  Heat and massage on low back.  Start home PT.  Follow up if not improving in 2-3 weeks.

## 2021-10-14 NOTE — Telephone Encounter (Signed)
Patient called back and appointments were made

## 2021-10-14 NOTE — Telephone Encounter (Signed)
Lvm for pt to rtn my call to schedule AWV with [facility]. Please schedule this appt if pt calls the office.

## 2021-11-09 ENCOUNTER — Other Ambulatory Visit: Payer: PPO

## 2021-11-11 DIAGNOSIS — D3131 Benign neoplasm of right choroid: Secondary | ICD-10-CM | POA: Diagnosis not present

## 2021-11-11 DIAGNOSIS — H353131 Nonexudative age-related macular degeneration, bilateral, early dry stage: Secondary | ICD-10-CM | POA: Diagnosis not present

## 2021-11-11 DIAGNOSIS — H43811 Vitreous degeneration, right eye: Secondary | ICD-10-CM | POA: Diagnosis not present

## 2021-11-11 DIAGNOSIS — H2513 Age-related nuclear cataract, bilateral: Secondary | ICD-10-CM | POA: Diagnosis not present

## 2021-11-14 ENCOUNTER — Ambulatory Visit: Payer: PPO

## 2021-11-17 ENCOUNTER — Ambulatory Visit (INDEPENDENT_AMBULATORY_CARE_PROVIDER_SITE_OTHER): Payer: PPO

## 2021-11-17 ENCOUNTER — Other Ambulatory Visit: Payer: Self-pay

## 2021-11-17 ENCOUNTER — Telehealth: Payer: Self-pay

## 2021-11-17 VITALS — Wt 184.0 lb

## 2021-11-17 DIAGNOSIS — Z0000 Encounter for general adult medical examination without abnormal findings: Secondary | ICD-10-CM | POA: Diagnosis not present

## 2021-11-17 DIAGNOSIS — Z780 Asymptomatic menopausal state: Secondary | ICD-10-CM | POA: Diagnosis not present

## 2021-11-17 DIAGNOSIS — Z1211 Encounter for screening for malignant neoplasm of colon: Secondary | ICD-10-CM | POA: Diagnosis not present

## 2021-11-17 DIAGNOSIS — Z86010 Personal history of colonic polyps: Secondary | ICD-10-CM

## 2021-11-17 DIAGNOSIS — Z1231 Encounter for screening mammogram for malignant neoplasm of breast: Secondary | ICD-10-CM | POA: Diagnosis not present

## 2021-11-17 MED ORDER — NA SULFATE-K SULFATE-MG SULF 17.5-3.13-1.6 GM/177ML PO SOLN: 1.0000 | Freq: Once | ORAL | 0 refills | Status: AC

## 2021-11-17 NOTE — Progress Notes (Signed)
Virtual Visit via Telephone Note  I connected with  Rebekah Johnston on 11/17/21 at  3:30 PM EDT by telephone and verified that I am speaking with the correct person using two identifiers.  Location: Patient: home Provider: [place] Persons participating in the virtual visit: [place]   I discussed the limitations, risks, security and privacy concerns of performing an evaluation and management service by telephone and the availability of in person appointments. The patient expressed understanding and agreed to proceed.  Interactive audio and video telecommunications were attempted between this nurse and patient, however failed, due to patient having technical difficulties OR patient did not have access to video capability.  We continued and completed visit with audio only.  Some vital signs may be absent or patient reported.   Dionisio David, LPN  Subjective:   Rebekah Johnston is a 71 y.o. female who presents for Medicare Annual (Subsequent) preventive examination.  Review of Systems     Cardiac Risk Factors include: advanced age (>39mn, >>76women);dyslipidemia     Objective:    There were no vitals filed for this visit. There is no height or weight on file to calculate BMI.     11/17/2021    3:44 PM 06/24/2021    4:04 PM 06/08/2021    5:37 PM 01/23/2020    9:57 AM 03/11/2019    8:55 PM 06/20/2018    8:57 AM 05/03/2018   12:40 PM  Advanced Directives  Does Patient Have a Medical Advance Directive? Yes No No Yes Yes Yes Yes  Type of AParamed[place]of [place] will   Healthcare Power of Attorney Living will;Healthcare Power of [place] will [place] will  Does patient want to make changes to medical advance directive? Yes (Inpatient - patient defers changing a medical advance directive and declines information at this time)        Copy of [place] Chart? Yes - validated most recent copy scanned in chart (See row information)          Current Medications (verified) Outpatient Encounter Medications as of 11/17/2021  Medication Sig   acetaminophen (TYLENOL) 325 MG tablet Take 650 mg by mouth every 6 (six) hours as needed.   betamethasone valerate (VALISONE) 0.1 % cream    citalopram (CELEXA) 40 MG tablet TAKE 1 TABLET BY MOUTH EVERY DAY   diclofenac Sodium (VOLTAREN) 1 % GEL    Influenza vac split quadrivalent PF (FLUZONE HIGH-DOSE) 0.5 ML injection    ipratropium (ATROVENT) 0.06 % nasal spray Place 2 sprays into both nostrils 4 (four) times daily.   nystatin cream (MYCOSTATIN)    pantoprazole (PROTONIX) 20 MG tablet    pantoprazole (PROTONIX) 40 MG tablet Take 40 mg by mouth daily.   rosuvastatin (CRESTOR) 5 MG tablet TAKE 1 TABLET BY MOUTH EVERY DAY   spironolactone (ALDACTONE) 25 MG tablet Take 12.5 mg by mouth daily.   acetaminophen-codeine (TYLENOL #3) 300-30 MG tablet TAKE 1 TAB BY MOUTH EVERY 6 HOURS AS NEEDED FOR PAIN (Patient not taking: Reported on 11/17/2021)   albuterol (PROAIR HFA) 108 (90 Base) MCG/ACT inhaler INHALE 1-2 PUFFS INTO THE LUNGS EVERY 6 HOURS AS NEEDED FOR WHEEZING AND SHORTNESS OF BREATH (Patient not taking: Reported on 11/17/2021)   atorvastatin (LIPITOR) 10 MG tablet  (Patient not taking: Reported on 11/17/2021)   benzonatate (TESSALON) 200 MG capsule TAKE 1 CAPSULE BY MOUTH EVERY DAY 3 TIMES  A DAY AS NEEDED FOR COUGH (Patient not taking: Reported on 11/17/2021)   cyclobenzaprine (FLEXERIL) 10 MG tablet TAKE 1 TABLET BY MOUTH AT BEDTIME AS NEEDED FOR MUSCLE SPASMS (Patient not taking: Reported on 11/17/2021)   diclofenac (VOLTAREN) 75 MG EC tablet  (Patient not taking: Reported on 11/17/2021)   HYDROcodone-acetaminophen (NORCO/VICODIN) 5-325 MG tablet  (Patient not taking: Reported on 11/17/2021)   ibuprofen (ADVIL) 800 MG tablet Take 1 tablet by mouth every 6 (six) hours as needed. (Patient not taking:  Reported on 11/17/2021)   metoprolol tartrate (LOPRESSOR) 25 MG tablet USE AS DIRECTED, TAKE 1 TABLET BY MOUTH NIGHT BEFORE AND 1 TABLET 90 MINUTES PRIOR TO CTA (Patient not taking: Reported on 11/17/2021)   ondansetron (ZOFRAN-ODT) 4 MG disintegrating tablet Take one tablet po q 12 prn nausea. (Patient not taking: Reported on 11/17/2021)   oxyCODONE (OXY IR/ROXICODONE) 5 MG immediate release tablet Take one tablet po q 4-6 hours prn pain. (Patient not taking: Reported on 11/17/2021)   oxyCODONE-acetaminophen (PERCOCET/ROXICET) 5-325 MG tablet  (Patient not taking: Reported on 11/17/2021)   predniSONE (DELTASONE) 10 MG tablet  (Patient not taking: Reported on 11/17/2021)   predniSONE (DELTASONE) 20 MG tablet 2 tabs by mouth daily x 5 days, then 1 tabs by mouth daily x 5 days (Patient not taking: Reported on 11/17/2021)   predniSONE (DELTASONE) 50 MG tablet  (Patient not taking: Reported on 11/17/2021)   sulfamethoxazole-trimethoprim (BACTRIM DS) 800-160 MG tablet  (Patient not taking: Reported on 11/17/2021)   traMADol (ULTRAM) 50 MG tablet  (Patient not taking: Reported on 11/17/2021)   No facility-administered encounter medications on file as of 11/17/2021.    Allergies (verified) Patient has no known allergies.   History: Past Medical History:  Diagnosis Date   Asthma    no inhalers   Bronchitis    taking meds now   Chronic kidney disease 2008   Stage G3b/A1   Depression    GERD (gastroesophageal reflux disease)    Glaucoma    no eye drops   Hyperlipidemia    no meds   Macular degeneration    Bil   Osteoarthritis    Past Surgical History:  Procedure Laterality Date   BUNIONECTOMY     rt. foot   CHOLECYSTECTOMY     HERNIA REPAIR     left inguinal hernia   TONSILLECTOMY  1967   Family History  Problem Relation Age of Onset   Prostate cancer Father    Dementia Mother    Diabetes Brother    Melanoma Other        Uncle   Colon cancer Neg Hx    Esophageal cancer Neg Hx     Rectal cancer Neg Hx    Stomach cancer Neg Hx    Social History   Socioeconomic History   Marital status: Divorced    Spouse name: Not on file   Number of children: 3   Years of education: Not on file   Highest education level: Not on file  Occupational History   Occupation: Academic librarian: UNEMPLOYED  Tobacco Use   Smoking status: Former    Packs/day: 0.25    Years: 3.00    Total pack years: 0.75    Types: Cigarettes    Quit date: 05/23/2001    Years since quitting: 20.5   Smokeless tobacco: Never   Tobacco comments:    smoked socially not everyday thing.  Vaping Use   Vaping Use: Never used  Substance  and Sexual Activity   Alcohol use: Yes    Alcohol/week: 1.0 standard drink of alcohol    Types: 1 Glasses of wine per week    Comment: occasional once a month   Drug use: No   Sexual activity: Not on file  Other Topics Concern   Not on file  Social History Narrative   Daily caffeine   Social Determinants of Health   Financial Resource Strain: Low Risk  (11/17/2021)   Overall Financial Resource Strain (CARDIA)    Difficulty of Paying Living Expenses: Not hard at all  Food Insecurity: No Food Insecurity (11/17/2021)   Hunger Vital Sign    Worried About Running Out of Food in the Last Year: Never true    Ran Out of Food in the Last Year: Never true  Transportation Needs: No Transportation Needs (11/17/2021)   PRAPARE - Hydrologist (Medical): No    Lack of Transportation (Non-Medical): No  Physical Activity: Insufficiently Active (11/17/2021)   Exercise Vital Sign    Days of Exercise per Week: 3 days    Minutes of Exercise per Session: 30 min  Stress: No Stress Concern Present (11/17/2021)   [place]    Feeling of Stress : Only a little  Social Connections: Moderately Isolated (11/17/2021)   Social Connection and Isolation Panel [NHANES]    Frequency of Communication  with Friends and Family: More than three times a week    Frequency of Social Gatherings with Friends and Family: Twice a week    Attends Religious Services: More than 4 times per year    Active Member of [employer] or Organizations: No    Attends Archivist Meetings: Never    Marital Status: Divorced    Tobacco Counseling Counseling given: Not Answered Tobacco comments: smoked socially not everyday thing.   Clinical Intake:  Pre-visit preparation completed: Yes  Pain : No/denies pain     Nutritional Risks: None Diabetes: No  How often do you need to have someone help you when you read instructions, pamphlets, or other written materials from your doctor or pharmacy?: 1 - Never  Diabetic?no  Interpreter Needed?: No  Information entered by :: Kirke Shaggy, LPN   Activities of Daily Living    11/17/2021    3:45 PM  In your present state of health, do you have any difficulty performing the following activities:  Hearing? 0  Vision? 0  Difficulty concentrating or making decisions? 0  Walking or climbing stairs? 1  Dressing or bathing? 0  Doing errands, shopping? 0  Preparing Food and eating ? N  Using the Toilet? N  In the past six months, have you accidently leaked urine? N  Do you have problems with loss of bowel control? N  Managing your Medications? N  Managing your Finances? N  Housekeeping or managing your Housekeeping? N    Patient Care Team: Jinny Sanders, MD as PCP - General Grant Fontana, DC as Referring Physician (Chiropractic Medicine)  Indicate any recent Medical Services you may have received from other than [facility] providers in the past year (date may be approximate).     Assessment:   This is a routine wellness examination for Ruqayya.  Hearing/Vision screen Hearing Screening - Comments:: No aids Vision Screening - Comments:: Readers- [facility]  Dietary issues and exercise activities discussed: Current Exercise Habits: Home exercise  routine, Type of exercise: walking, Time (Minutes): 30, Frequency (Times/Week): 3,  Weekly Exercise (Minutes/Week): 90, Intensity: Mild   Goals Addressed             This Visit's Progress    DIET - EAT MORE FRUITS AND VEGETABLES         Depression Screen    11/17/2021    3:42 PM 11/16/2020    4:50 PM 06/24/2019   12:39 PM 05/23/2018    2:52 PM 04/17/2017    9:36 AM 03/30/2016   11:10 AM 03/18/2015    1:56 PM  PHQ 2/9 Scores  PHQ - 2 Score 0 0 0 0 0 0 0  PHQ- 9 Score 0 1 2  0      Fall Risk    11/17/2021    3:45 PM 11/16/2020    3:38 PM 04/13/2020    9:06 AM 06/24/2019   11:41 AM 05/23/2018    2:52 PM  Fall Risk   Falls in the past year? '1 1 1 1 '$ 0  Number falls in past yr: 0 0 1 1   Injury with Fall? 0 0 0 0   Risk for fall due to : History of fall(s)      Follow up Falls prevention discussed  Falls evaluation completed      FALL RISK PREVENTION PERTAINING TO THE HOME:  Any stairs in or around the home? No  If so, are there any without handrails? No  Home free of loose throw rugs in walkways, pet beds, electrical cords, etc? Yes  Adequate lighting in your home to reduce risk of falls? Yes   ASSISTIVE DEVICES UTILIZED TO PREVENT FALLS:  Life alert? No  Use of a cane, walker or w/c? No  Grab bars in the bathroom? No  Shower chair or bench in shower? No  Elevated toilet seat or a handicapped toilet? Yes   Cognitive Function:    04/17/2017    9:35 AM 03/30/2016   10:59 AM  MMSE - Mini Mental State Exam  Orientation to time 5 5  Orientation to Place 5 5  Registration 3 3  Attention/ Calculation 0 0  Recall 3 3  Language- name 2 objects 0 0  Language- repeat 1 1  Language- follow 3 step command 3 3  Language- read & follow direction 0 0  Write a sentence 0 0  Copy design 0 0  Total score 20 20        11/17/2021    3:47 PM  6CIT Screen  What Year? 0 points  What time? 0 points  Count back from 20 0 points  Months in reverse 0 points  Repeat phrase 0  points    Immunizations Immunization History  Administered Date(s) Administered   Influenza Split 05/07/2013   Influenza Whole 01/30/2008   Influenza, High Dose Seasonal PF 01/17/2018, 03/03/2019   Influenza,inj,Quad PF,6+ Mos 01/13/2014, 03/18/2015, 01/18/2016, 03/09/2017   Influenza-Unspecified 01/28/2020   PFIZER(Purple Top)SARS-COV-2 Vaccination 06/12/2019, 07/22/2019, 04/01/2020   Pneumococcal Conjugate-13 03/18/2015   Pneumococcal Polysaccharide-23 03/30/2016   Td 02/04/2002, 06/08/2021   Tdap 02/15/2017    TDAP status: Up to date  Flu Vaccine status: Declined, Education has been provided regarding the importance of this vaccine but patient still declined. Advised may receive this vaccine at local pharmacy or Health Dept. Aware to provide a copy of the vaccination record if obtained from local pharmacy or Health Dept. Verbalized acceptance and understanding.  Pneumococcal vaccine status: Up to date  Covid-19 vaccine status: Completed vaccines  Qualifies for Shingles Vaccine? Yes  Zostavax completed Yes   Shingrix Completed?: No.    Education has been provided regarding the importance of this vaccine. Patient has been advised to call insurance company to determine out of pocket expense if they have not yet received this vaccine. Advised may also receive vaccine at local pharmacy or Health Dept. Verbalized acceptance and understanding.  Screening Tests Health Maintenance  Topic Date Due   Zoster Vaccines- Shingrix (1 of 2) Never done   MAMMOGRAM  05/15/2018   COVID-19 Vaccine (4 - Pfizer series) 05/27/2020   COLONOSCOPY (Pts 45-11yr Insurance coverage will need to be confirmed)  06/06/2021   INFLUENZA VACCINE  11/29/2021   TETANUS/TDAP  06/09/2031   Pneumonia Vaccine 72 Years old  Completed   DEXA SCAN  Completed   Hepatitis C Screening  Completed   HPV VACCINES  Aged Out    Health Maintenance  Health Maintenance Due  Topic Date Due   Zoster Vaccines- Shingrix  (1 of 2) Never done   MAMMOGRAM  05/15/2018   COVID-19 Vaccine (4 - Pfizer series) 05/27/2020   COLONOSCOPY (Pts 45-484yrInsurance coverage will need to be confirmed)  06/06/2021    Colorectal cancer screening: Type of screening: Colonoscopy. Completed 06/06/16. Repeat every 5 years  Mammogram status: Completed 05/15/16. Repeat every year  Bone Density status: Completed 05/15/16. Results reflect: Bone density results: OSTEOPENIA. Repeat every 5 years.  Lung Cancer Screening: (Low Dose CT Chest recommended if Age 72-80ears, 30 pack-year currently smoking OR have quit w/in 15years.) does not qualify.   Additional Screening:  Hepatitis C Screening: does qualify; Completed 03/11/15  Vision Screening: Recommended annual ophthalmology exams for early detection of glaucoma and other disorders of the eye. Is the patient up to date with their annual eye exam?  Yes  Who is the provider or what is the name of the office in which the patient attends annual eye exams? [place] not established with a provider, would they like to be referred to a provider to establish care? No .   Dental Screening: Recommended annual dental exams for proper oral hygiene  Community Resource Referral / Chronic Care Management: CRR required this visit?  No   CCM required this visit?  No      Plan:     I have personally reviewed and noted the following in the patient's chart:   Medical and social history Use of alcohol, tobacco or illicit drugs  Current medications and supplements including opioid prescriptions.  Functional ability and status Nutritional status Physical activity Advanced directives List of other physicians Hospitalizations, surgeries, and ER visits in previous 12 months Vitals Screenings to include cognitive, depression, and falls Referrals and appointments  In addition, I have reviewed and discussed with patient certain preventive protocols, quality metrics, and best  practice recommendations. A written personalized care plan for preventive services as well as general preventive health recommendations were provided to patient.     LoDionisio DavidLPN   11/07/58/4540 Nurse Notes: none

## 2021-11-17 NOTE — Telephone Encounter (Signed)
Gastroenterology Pre-Procedure Review  Request Date: 01/09/22 Requesting Physician: Dr. Allen Norris  PATIENT REVIEW QUESTIONS: The patient responded to the following health history questions as indicated:    1. Are you having any GI issues? no 2. Do you have a personal history of Polyps? yes (2018 colonoscopy performed at [facility] Endo Dr. Lucio Edward noted polyps) 3. Do you have a family history of Colon Cancer or Polyps? no 4. Diabetes Mellitus? no 5. Joint replacements in the past 12 months?no 6. Major health problems in the past 3 months?no 7. Any artificial heart valves, MVP, or defibrillator?no    MEDICATIONS & ALLERGIES:    Patient reports the following regarding taking any anticoagulation/antiplatelet therapy:   Plavix, Coumadin, Eliquis, Xarelto, Lovenox, Pradaxa, Brilinta, or Effient? no Aspirin? no  Patient confirms/reports the following medications:  Current Outpatient Medications  Medication Sig Dispense Refill   acetaminophen (TYLENOL) 325 MG tablet Take 650 mg by mouth every 6 (six) hours as needed.     acetaminophen-codeine (TYLENOL #3) 300-30 MG tablet TAKE 1 TAB BY MOUTH EVERY 6 HOURS AS NEEDED FOR PAIN (Patient not taking: Reported on 11/17/2021)     albuterol (PROAIR HFA) 108 (90 Base) MCG/ACT inhaler INHALE 1-2 PUFFS INTO THE LUNGS EVERY 6 HOURS AS NEEDED FOR WHEEZING AND SHORTNESS OF BREATH (Patient not taking: Reported on 11/17/2021)     atorvastatin (LIPITOR) 10 MG tablet  (Patient not taking: Reported on 11/17/2021)     benzonatate (TESSALON) 200 MG capsule TAKE 1 CAPSULE BY MOUTH EVERY DAY 3 TIMES A DAY AS NEEDED FOR COUGH (Patient not taking: Reported on 11/17/2021)     betamethasone valerate (VALISONE) 0.1 % cream      citalopram (CELEXA) 40 MG tablet TAKE 1 TABLET BY MOUTH EVERY DAY 90 tablet 1   cyclobenzaprine (FLEXERIL) 10 MG tablet TAKE 1 TABLET BY MOUTH AT BEDTIME AS NEEDED FOR MUSCLE SPASMS (Patient not taking: Reported on 11/17/2021)     diclofenac (VOLTAREN)  75 MG EC tablet  (Patient not taking: Reported on 11/17/2021)     diclofenac Sodium (VOLTAREN) 1 % GEL      HYDROcodone-acetaminophen (NORCO/VICODIN) 5-325 MG tablet  (Patient not taking: Reported on 11/17/2021)     ibuprofen (ADVIL) 800 MG tablet Take 1 tablet by mouth every 6 (six) hours as needed. (Patient not taking: Reported on 11/17/2021)     Influenza vac split quadrivalent PF (FLUZONE HIGH-DOSE) 0.5 ML injection      ipratropium (ATROVENT) 0.06 % nasal spray Place 2 sprays into both nostrils 4 (four) times daily. 15 mL 12   metoprolol tartrate (LOPRESSOR) 25 MG tablet USE AS DIRECTED, TAKE 1 TABLET BY MOUTH NIGHT BEFORE AND 1 TABLET 90 MINUTES PRIOR TO CTA (Patient not taking: Reported on 11/17/2021)     nystatin cream (MYCOSTATIN)      ondansetron (ZOFRAN-ODT) 4 MG disintegrating tablet Take one tablet po q 12 prn nausea. (Patient not taking: Reported on 11/17/2021)     oxyCODONE (OXY IR/ROXICODONE) 5 MG immediate release tablet Take one tablet po q 4-6 hours prn pain. (Patient not taking: Reported on 11/17/2021)     oxyCODONE-acetaminophen (PERCOCET/ROXICET) 5-325 MG tablet  (Patient not taking: Reported on 11/17/2021)     pantoprazole (PROTONIX) 20 MG tablet      pantoprazole (PROTONIX) 40 MG tablet Take 40 mg by mouth daily.  11   predniSONE (DELTASONE) 10 MG tablet  (Patient not taking: Reported on 11/17/2021)     predniSONE (DELTASONE) 20 MG tablet 2 tabs by mouth daily  x 5 days, then 1 tabs by mouth daily x 5 days (Patient not taking: Reported on 11/17/2021) 15 tablet 0   predniSONE (DELTASONE) 50 MG tablet  (Patient not taking: Reported on 11/17/2021)     rosuvastatin (CRESTOR) 5 MG tablet TAKE 1 TABLET BY MOUTH EVERY DAY 90 tablet 0   spironolactone (ALDACTONE) 25 MG tablet Take 12.5 mg by mouth daily.     sulfamethoxazole-trimethoprim (BACTRIM DS) 800-160 MG tablet  (Patient not taking: Reported on 11/17/2021)     traMADol (ULTRAM) 50 MG tablet  (Patient not taking: Reported on 11/17/2021)      No current facility-administered medications for this visit.    Patient confirms/reports the following allergies:  No Known Allergies  No orders of the defined types were placed in this encounter.   AUTHORIZATION INFORMATION Primary Insurance: 1D#: Group #:  Secondary Insurance: 1D#: Group #:  SCHEDULE INFORMATION: Date: 01/09/22 Time: Location: [facility]

## 2021-11-17 NOTE — Telephone Encounter (Signed)
[facility] Night - Client Nonclinical Telephone Record  [facility] Client [name] Night - Client Client Site [name] - Night Provider Eliezer Lofts - MD Contact Type Call Who Is Calling Patient / Member / Family / Caregiver Caller Name [name] Caller Phone Number [phone] Patient Name Rebekah Johnston Patient DOB [birth year] Call Type Message Only Information Provided Reason for Call Request to [facility] Appointment Initial Comment Caller states her appt was rescheduled but she still got a reminder call. Patient request to speak to RN No Disp. Time Disposition Final User 11/16/2021 9:41:01 PM General Information Provided Yes Paulita Cradle Call Closed By: Paulita Cradle Transaction Date/Time: 11/16/2021 9:39:07 PM (ET

## 2021-11-17 NOTE — Patient Instructions (Signed)
Rebekah Johnston , Thank you for taking time to come for your Medicare Wellness Visit. I appreciate your ongoing commitment to your health goals. Please review the following plan we discussed and let me know if I can assist you in the future.   Screening recommendations/referrals: Colonoscopy: 06/06/16, referral sent Mammogram: 05/15/16, referral sent Bone Density: 05/15/16, referral sent Recommended yearly ophthalmology/optometry visit for glaucoma screening and checkup Recommended yearly dental visit for hygiene and checkup  Vaccinations: Influenza vaccine: n/d Pneumococcal vaccine: 03/30/16 Tdap vaccine: 06/08/21 Shingles vaccine: Zostavax 05/02/15, needs Shingrix   Covid-19:06/12/19, 07/22/19, 04/01/20  Advanced directives: yes  Conditions/risks identified: no  Next appointment: Follow up in one year for your annual wellness visit 11/20/22 @ 2:30 pm by phone   Preventive Care 65 Years and Older, Female Preventive care refers to lifestyle choices and visits with your health care provider that can promote health and wellness. What does preventive care include? A yearly physical exam. This is also called an annual well check. Dental exams once or twice a year. Routine eye exams. Ask your health care provider how often you should have your eyes checked. Personal lifestyle choices, including: Daily care of your teeth and gums. Regular physical activity. Eating a healthy diet. Avoiding tobacco and drug use. Limiting alcohol use. Practicing safe sex. Taking low-dose aspirin every day. Taking vitamin and mineral supplements as recommended by your health care provider. What happens during an annual well check? The services and screenings done by your health care provider during your annual well check will depend on your age, overall health, lifestyle risk factors, and family history of disease. Counseling  Your health care provider may ask you questions about your: Alcohol use. Tobacco  use. Drug use. Emotional well-being. Home and relationship well-being. Sexual activity. Eating habits. History of falls. Memory and ability to understand (cognition). Work and work Statistician. Reproductive health. Screening  You may have the following tests or measurements: Height, weight, and BMI. Blood pressure. Lipid and cholesterol levels. These may be checked every 5 years, or more frequently if you are over 22 years old. Skin check. Lung cancer screening. You may have this screening every year starting at age 55 if you have a 30-pack-year history of smoking and currently smoke or have quit within the past 15 years. Fecal occult blood test (FOBT) of the stool. You may have this test every year starting at age 53. Flexible sigmoidoscopy or colonoscopy. You may have a sigmoidoscopy every 5 years or a colonoscopy every 10 years starting at age 18. Hepatitis C blood test. Hepatitis B blood test. Sexually transmitted disease (STD) testing. Diabetes screening. This is done by checking your blood sugar (glucose) after you have not eaten for a while (fasting). You may have this done every 1-3 years. Bone density scan. This is done to screen for osteoporosis. You may have this done starting at age 39. Mammogram. This may be done every 1-2 years. Talk to your health care provider about how often you should have regular mammograms. Talk with your health care provider about your test results, treatment options, and if necessary, the need for more tests. Vaccines  Your health care provider may recommend certain vaccines, such as: Influenza vaccine. This is recommended every year. Tetanus, diphtheria, and acellular pertussis (Tdap, Td) vaccine. You may need a Td booster every 10 years. Zoster vaccine. You may need this after age 42. Pneumococcal 13-valent conjugate (PCV13) vaccine. One dose is recommended after age 40. Pneumococcal polysaccharide (PPSV23) vaccine. One dose is  recommended  after age 70. Talk to your health care provider about which screenings and vaccines you need and how often you need them. This information is not intended to replace advice given to you by your health care provider. Make sure you discuss any questions you have with your health care provider. Document Released: 05/14/2015 Document Revised: 01/05/2016 Document Reviewed: 02/16/2015 Elsevier Interactive Patient Education  2017 [place] Prevention in the Home Falls can cause injuries. They can happen to people of all ages. There are many things you can do to make your home safe and to help prevent falls. What can I do on the outside of my home? Regularly fix the edges of walkways and driveways and fix any cracks. Remove anything that might make you trip as you walk through a door, such as a raised step or threshold. Trim any bushes or trees on the path to your home. Use bright outdoor lighting. Clear any walking paths of anything that might make someone trip, such as rocks or tools. Regularly check to see if handrails are loose or broken. Make sure that both sides of any steps have handrails. Any raised decks and porches should have guardrails on the edges. Have any leaves, snow, or ice cleared regularly. Use sand or salt on walking paths during winter. Clean up any spills in your garage right away. This includes oil or grease spills. What can I do in the bathroom? Use night lights. Install grab bars by the toilet and in the tub and shower. Do not use towel bars as grab bars. Use non-skid mats or decals in the tub or shower. If you need to sit down in the shower, use a plastic, non-slip stool. Keep the floor dry. Clean up any water that spills on the floor as soon as it happens. Remove soap buildup in the tub or shower regularly. Attach bath mats securely with double-sided non-slip rug tape. Do not have throw rugs and other things on the floor that can make you trip. What can I do  in the bedroom? Use night lights. Make sure that you have a light by your bed that is easy to reach. Do not use any sheets or blankets that are too big for your bed. They should not hang down onto the floor. Have a firm chair that has side arms. You can use this for support while you get dressed. Do not have throw rugs and other things on the floor that can make you trip. What can I do in the kitchen? Clean up any spills right away. Avoid walking on wet floors. Keep items that you use a lot in easy-to-reach places. If you need to reach something above you, use a strong step stool that has a grab bar. Keep electrical cords out of the way. Do not use floor polish or wax that makes floors slippery. If you must use wax, use non-skid floor wax. Do not have throw rugs and other things on the floor that can make you trip. What can I do with my stairs? Do not leave any items on the stairs. Make sure that there are handrails on both sides of the stairs and use them. Fix handrails that are broken or loose. Make sure that handrails are as long as the stairways. Check any carpeting to make sure that it is firmly attached to the stairs. Fix any carpet that is loose or worn. Avoid having throw rugs at the top or bottom of the stairs. If  you do have throw rugs, attach them to the floor with carpet tape. Make sure that you have a light switch at the top of the stairs and the bottom of the stairs. If you do not have them, ask someone to add them for you. What else can I do to help prevent falls? Wear shoes that: Do not have high heels. Have rubber bottoms. Are comfortable and fit you well. Are closed at the toe. Do not wear sandals. If you use a stepladder: Make sure that it is fully opened. Do not climb a closed stepladder. Make sure that both sides of the stepladder are locked into place. Ask someone to hold it for you, if possible. Clearly mark and make sure that you can see: Any grab bars or  handrails. First and last steps. Where the edge of each step is. Use tools that help you move around (mobility aids) if they are needed. These include: Canes. Walkers. Scooters. Crutches. Turn on the lights when you go into a dark area. Replace any light bulbs as soon as they burn out. Set up your furniture so you have a clear path. Avoid moving your furniture around. If any of your floors are uneven, fix them. If there are any pets around you, be aware of where they are. Review your medicines with your doctor. Some medicines can make you feel dizzy. This can increase your chance of falling. Ask your doctor what other things that you can do to help prevent falls. This information is not intended to replace advice given to you by your health care provider. Make sure you discuss any questions you have with your health care provider. Document Released: 02/11/2009 Document Revised: 09/23/2015 Document Reviewed: 05/22/2014 Elsevier Interactive Patient Education  2017 [employer].

## 2021-11-18 ENCOUNTER — Encounter: Payer: PPO | Admitting: Family Medicine

## 2021-11-26 ENCOUNTER — Other Ambulatory Visit: Payer: Self-pay | Admitting: Family Medicine

## 2021-11-28 ENCOUNTER — Ambulatory Visit: Payer: PPO | Admitting: Family Medicine

## 2021-11-28 DIAGNOSIS — A6920 Lyme disease, unspecified: Secondary | ICD-10-CM | POA: Diagnosis not present

## 2021-12-01 ENCOUNTER — Ambulatory Visit
Admission: RE | Admit: 2021-12-01 | Discharge: 2021-12-01 | Disposition: A | Discharge status: Home / Self Care | Payer: PPO | Source: Ambulatory Visit

## 2021-12-01 VITALS — BP 124/83 | HR 57 | Temp 98.8°F | Resp 16

## 2021-12-01 DIAGNOSIS — W57XXXA Bitten or stung by nonvenomous insect and other nonvenomous arthropods, initial encounter: Secondary | ICD-10-CM

## 2021-12-01 DIAGNOSIS — S0006XA Insect bite (nonvenomous) of scalp, initial encounter: Secondary | ICD-10-CM

## 2021-12-01 DIAGNOSIS — R5383 Other fatigue: Secondary | ICD-10-CM

## 2021-12-01 NOTE — ED Triage Notes (Signed)
Pt removed a tick on 7/23 from her scalp. On 7/29 she developed a HA and the next day she was fatigued and ran for 2 days. She obtained doxycycline from a home visit and was prescribe 100 mg BID for 3 weeks she started this on 7/31. She no longer c/o HA but she is still fatigued.

## 2021-12-01 NOTE — ED Provider Notes (Signed)
[facility]    CSN: [id number] Arrival date & time: 12/01/21  1004      History   Chief Complaint Chief Complaint  Patient presents with   Tick Removal    Tick was taken off July 23.  Symptoms started Saturday July 29, headache, fever, no energy. Being treated for Lyme disease with doxycycline but have not had blood work and would like to do that. - Entered by patient   Fatigue    HPI Rebekah Johnston is a 71 y.o. female.   HPI  71 year old female here requesting blood work for tickborne illness.  Patient reports that she found a tick on her scalp and removed it on 11/20/2021.  On 11/26/2021 she developed a headache, fatigue, and fever with a Tmax of 101.2.  She also had joint pain in her fingers which was more prominent than her typical arthritis pain.  She had a home visit and was prescribed doxycycline 100 mg twice daily x21 days on 11/28/2021.  She is currently taking that.  She denies any rashes, sore throat, cough, or ear pain.  Past Medical History:  Diagnosis Date   Asthma    no inhalers   Bronchitis    taking meds now   Chronic kidney disease 2008   Stage G3b/A1   Depression    GERD (gastroesophageal reflux disease)    Glaucoma    no eye drops   Hyperlipidemia    no meds   Macular degeneration    Bil   Osteoarthritis     Patient Active Problem List   Diagnosis Date Noted   Lumbar back pain with radiculopathy affecting left lower extremity 10/11/2021   Acute midline thoracic back pain 04/13/2020   Cough 07/09/2018   ETD (Eustachian tube dysfunction), left 07/09/2018   Vitamin D deficiency 05/10/2017   Medicare annual wellness visit, subsequent 04/17/2017   Trochanteric bursitis of left hip 03/09/2017   Osteopenia 05/16/2016   Major depressive disorder, recurrent episode, moderate ([place]) 04/04/2016   Gastroesophageal reflux disease 04/04/2016   Low back pain 01/18/2016   Bilateral chronic knee pain 01/18/2016   Counseling regarding end of  life decision making 03/18/2015   Chronic kidney disease (CKD) stage G3b/A1, moderately decreased glomerular filtration rate (GFR) between 30-44 mL/min/1.73 square meter and albuminuria creatinine ratio less than 30 mg/g ([place]) 08/20/2008   HYPERCHOLESTEROLEMIA 05/20/2008   ALLERGIC RHINITIS 04/16/2008   EXERCISE INDUCED ASTHMA 04/16/2008   OSTEOARTHRITIS 04/16/2008   PREDIABETES 31/54/0086   UMBILICAL HERNIORRHAPHY, HX OF 04/16/2008    Past Surgical History:  Procedure Laterality Date   BUNIONECTOMY     rt. foot   CHOLECYSTECTOMY     HERNIA REPAIR     left inguinal hernia   TONSILLECTOMY  1967    OB History   No obstetric history on file.      Home Medications    Prior to Admission medications   Medication Sig Start Date End Date Taking? Authorizing Provider  acetaminophen (TYLENOL) 325 MG tablet Take 650 mg by mouth every 6 (six) hours as needed.   Yes [provider]  betamethasone valerate (VALISONE) 0.1 % cream    Yes [provider]  citalopram (CELEXA) 40 MG tablet TAKE 1 TABLET BY MOUTH EVERY DAY 11/27/21  Yes Bedsole, Amy E, MD  ipratropium (ATROVENT) 0.06 % nasal spray Place 2 sprays into both nostrils 4 (four) times daily. 06/24/21  Yes Margarette Canada, NP  nystatin cream (MYCOSTATIN)    Yes [provider]  pantoprazole (PROTONIX) 20 MG tablet    Yes [provider]  rosuvastatin (CRESTOR) 5 MG tablet TAKE 1 TABLET BY MOUTH EVERY DAY 09/16/21  Yes Bedsole, Amy E, MD  spironolactone (ALDACTONE) 25 MG tablet Take 12.5 mg by mouth daily. 06/20/21  Yes [provider]  acetaminophen-codeine (TYLENOL #3) 300-30 MG tablet TAKE 1 TAB BY MOUTH EVERY 6 HOURS AS NEEDED FOR PAIN Patient not taking: Reported on 11/17/2021    [provider]  albuterol (PROAIR HFA) 108 (90 Base) MCG/ACT inhaler INHALE 1-2 PUFFS INTO THE LUNGS EVERY 6 HOURS AS NEEDED FOR WHEEZING AND SHORTNESS OF BREATH Patient not taking: Reported on 11/17/2021     [provider]  atorvastatin (LIPITOR) 10 MG tablet     [provider]  benzonatate (TESSALON) 200 MG capsule TAKE 1 CAPSULE BY MOUTH EVERY DAY 3 TIMES A DAY AS NEEDED FOR COUGH Patient not taking: Reported on 11/17/2021    [provider]  cyclobenzaprine (FLEXERIL) 10 MG tablet TAKE 1 TABLET BY MOUTH AT BEDTIME AS NEEDED FOR MUSCLE SPASMS Patient not taking: Reported on 11/17/2021    [provider]  diclofenac (VOLTAREN) 75 MG EC tablet     [provider]  diclofenac Sodium (VOLTAREN) 1 % GEL     [provider]  doxycycline (VIBRAMYCIN) 100 MG capsule Take 100 mg by mouth every 12 (twelve) hours. 11/28/21   [provider]  HYDROcodone-acetaminophen (NORCO/VICODIN) 5-325 MG tablet     [provider]  ibuprofen (ADVIL) 800 MG tablet Take 1 tablet by mouth every 6 (six) hours as needed. Patient not taking: Reported on 11/17/2021    [provider]  Influenza vac split quadrivalent PF (FLUZONE HIGH-DOSE) 0.5 ML injection     [provider]  metoprolol tartrate (LOPRESSOR) 25 MG tablet USE AS DIRECTED, TAKE 1 TABLET BY MOUTH NIGHT BEFORE AND 1 TABLET 90 MINUTES PRIOR TO CTA Patient not taking: Reported on 11/17/2021    [provider]  ondansetron (ZOFRAN-ODT) 4 MG disintegrating tablet Take one tablet po q 12 prn nausea. Patient not taking: Reported on 11/17/2021    [provider]  oxyCODONE (OXY IR/ROXICODONE) 5 MG immediate release tablet Take one tablet po q 4-6 hours prn pain. Patient not taking: Reported on 11/17/2021    [provider]  oxyCODONE-acetaminophen (PERCOCET/ROXICET) 5-325 MG tablet     [provider]  pantoprazole (PROTONIX) 40 MG tablet Take 40 mg by mouth daily. 02/16/15   [provider]  predniSONE (DELTASONE) 10 MG tablet     [provider]  predniSONE (DELTASONE) 20 MG tablet 2 tabs by mouth daily x 5 days, then 1 tabs by  mouth daily x 5 days Patient not taking: Reported on 11/17/2021 10/11/21   Jinny Sanders, MD  predniSONE (DELTASONE) 50 MG tablet     [provider]  traMADol (ULTRAM) 50 MG tablet     [provider]    Family History Family History  Problem Relation Age of Onset   Prostate cancer Father    Dementia Mother    Diabetes Brother    Melanoma Other        Uncle   Colon cancer Neg Hx    Esophageal cancer Neg Hx    Rectal cancer Neg Hx    Stomach cancer Neg Hx     Social History Social History   Tobacco Use   Smoking status: Former    Packs/day: 0.25    Years:  3.00    Total pack years: 0.75    Types: Cigarettes    Quit date: 05/23/2001    Years since quitting: 20.5   Smokeless tobacco: Never   Tobacco comments:    smoked socially not everyday thing.  Vaping Use   Vaping Use: Never used  Substance Use Topics   Alcohol use: Yes    Alcohol/week: 1.0 standard drink of alcohol    Types: 1 Glasses of wine per week    Comment: occasional once a month   Drug use: No     Allergies   Patient has no known allergies.   Review of Systems Review of Systems  Constitutional:  Positive for fatigue and fever.  HENT:  Negative for ear pain and sore throat.   Respiratory:  Negative for cough.   Musculoskeletal:  Positive for arthralgias. Negative for joint swelling.  Skin:  Negative for rash.  Neurological:  Positive for headaches.  Hematological: Negative.   Psychiatric/Behavioral: Negative.       Physical Exam Triage Vital Signs ED Triage Vitals  Enc Vitals Group     BP      Pulse      Resp      Temp      Temp src      SpO2      Weight      Height      Head Circumference      Peak Flow      Pain Score      Pain Loc      Pain Edu?      Excl. in [place]?    No data found.  Updated Vital Signs BP 124/83 (BP Location: Right Arm)   Pulse (!) 57   Temp 98.8 F (37.1 C) (Oral)   Resp 16   SpO2 98%   Visual Acuity Right Eye Distance:   Left Eye  Distance:   Bilateral Distance:    Right Eye Near:   Left Eye Near:    Bilateral Near:     Physical Exam Vitals and nursing note reviewed.  Constitutional:      Appearance: Normal appearance. She is not ill-appearing.  HENT:     Head: Normocephalic and atraumatic.     Right Ear: Tympanic membrane, ear canal and external ear normal. There is no impacted cerumen.     Left Ear: Tympanic membrane, ear canal and external ear normal. There is no impacted cerumen.     Nose: Nose normal. No congestion or rhinorrhea.     Mouth/Throat:     Mouth: Mucous membranes are moist.     Pharynx: Oropharynx is clear. No oropharyngeal exudate or posterior oropharyngeal erythema.  Cardiovascular:     Rate and Rhythm: Normal rate and regular rhythm.     Pulses: Normal pulses.     Heart sounds: Normal heart sounds. No murmur heard.    No friction rub. No gallop.  Pulmonary:     Effort: Pulmonary effort is normal.     Breath sounds: Normal breath sounds. No wheezing, rhonchi or rales.  Musculoskeletal:     Cervical back: Normal range of motion and neck supple.  Lymphadenopathy:     Cervical: No cervical adenopathy.  Skin:    General: Skin is warm and dry.     Capillary Refill: Capillary refill takes less than 2 seconds.     Findings: No erythema or rash.  Neurological:     General: No focal deficit present.     Mental  Status: She is alert and oriented to person, place, and time.  Psychiatric:        Mood and Affect: Mood normal.        Behavior: Behavior normal.        Thought Content: Thought content normal.        Judgment: Judgment normal.      UC Treatments / Results  Labs (all labs ordered are listed, but only abnormal results are displayed) Labs Reviewed - No data to display  EKG   Radiology No results found.  Procedures Procedures (including critical care time)  Medications Ordered in UC Medications - No data to display  Initial Impression / Assessment and Plan / UC  Course  I have reviewed the triage vital signs and the nursing notes.  Pertinent labs & imaging results that were available during my care of the patient were reviewed by me and considered in my medical decision making (see chart for details).  Patient is a very pleasant, nontoxic-appearing 72 year old female here for evaluation of ongoing fatigue after suffering a tick bite.  She has already been prescribed doxycycline 100 mg twice daily and she has been on that for the last 4 days.  She did have a tick bite on 11/20/2021 on her scalp.  She denies any rash or large joint pain.  She is here requesting blood work for tickborne illness.  Her physical exam reveals pearly-gray tympanic membranes bilaterally with normal light reflex and clear external auditory canals.  Nasal mucosa is pink and moist no erythema, edema, or discharge.  Oropharyngeal exam is benign.  No cervical lymphadenopathy appreciable exam.  Cardiopulmonary exam reveals S1-S2 heart sounds with regular rate and rhythm and lung sounds are clear auscultation all fields.  Integumentary exam does not reveal the presence of any rashes.  I explained to the patient that her tick bite was only 10 days ago and she is already on doxycycline.  Blood work is not recommended within the first 2 weeks after exposure as you could have a false negative.  Given that she is already on the appropriate therapy for all concerning tickborne illnesses with the exception of alpha gal I recommend that she complete her antibiotic therapy course and if she still having symptoms have blood work at that time.   Final Clinical Impressions(s) / UC Diagnoses   Final diagnoses:  Fatigue, unspecified type  Tick bite of scalp, initial encounter     Discharge Instructions      As we discussed today, within the first 2 weeks following a tick bite your body may not produce enough antibodies to cause a positive test for tickborne illness.  In testing within the first 2 weeks  of exposure is not recommended by the CDC.  You were already on appropriate therapy for all tickborne illness with exception of alpha-gal, for which there is no treatment.  I recommend that she finish your course of antibiotics and if you are still experiencing fatigue, joint pain, headache, or you have return of fevers, or you develop a rash, that you should have tick panel performed at that time.     ED Prescriptions   None    PDMP not reviewed this encounter.   Margarette Canada, NP 12/01/21 1048

## 2021-12-01 NOTE — Discharge Instructions (Addendum)
As we discussed today, within the first 2 weeks following a tick bite your body may not produce enough antibodies to cause a positive test for tickborne illness.  In testing within the first 2 weeks of exposure is not recommended by the CDC.  You were already on appropriate therapy for all tickborne illness with exception of alpha-gal, for which there is no treatment.  I recommend that she finish your course of antibiotics and if you are still experiencing fatigue, joint pain, headache, or you have return of fevers, or you develop a rash, that you should have tick panel performed at that time.

## 2021-12-20 ENCOUNTER — Ambulatory Visit
Admission: RE | Admit: 2021-12-20 | Discharge: 2021-12-20 | Disposition: A | Discharge status: Home / Self Care | Payer: PPO | Source: Ambulatory Visit | Attending: Family Medicine | Admitting: Family Medicine

## 2021-12-20 DIAGNOSIS — Z1231 Encounter for screening mammogram for malignant neoplasm of breast: Secondary | ICD-10-CM | POA: Insufficient documentation

## 2021-12-20 DIAGNOSIS — Z780 Asymptomatic menopausal state: Secondary | ICD-10-CM | POA: Diagnosis not present

## 2021-12-20 DIAGNOSIS — M85851 Other specified disorders of bone density and structure, right thigh: Secondary | ICD-10-CM | POA: Diagnosis not present

## 2021-12-23 ENCOUNTER — Other Ambulatory Visit: Payer: Self-pay | Admitting: Family Medicine

## 2021-12-23 DIAGNOSIS — R0602 Shortness of breath: Secondary | ICD-10-CM

## 2021-12-26 DIAGNOSIS — K219 Gastro-esophageal reflux disease without esophagitis: Secondary | ICD-10-CM | POA: Diagnosis not present

## 2021-12-26 DIAGNOSIS — I340 Nonrheumatic mitral (valve) insufficiency: Secondary | ICD-10-CM | POA: Diagnosis not present

## 2021-12-26 DIAGNOSIS — R0602 Shortness of breath: Secondary | ICD-10-CM | POA: Diagnosis not present

## 2021-12-26 DIAGNOSIS — E782 Mixed hyperlipidemia: Secondary | ICD-10-CM | POA: Diagnosis not present

## 2021-12-26 DIAGNOSIS — R0789 Other chest pain: Secondary | ICD-10-CM | POA: Diagnosis not present

## 2022-01-04 ENCOUNTER — Encounter: Payer: Self-pay | Admitting: Gastroenterology

## 2022-01-06 DIAGNOSIS — F17200 Nicotine dependence, unspecified, uncomplicated: Secondary | ICD-10-CM | POA: Diagnosis not present

## 2022-01-06 DIAGNOSIS — N1831 Chronic kidney disease, stage 3a: Secondary | ICD-10-CM | POA: Diagnosis not present

## 2022-01-06 DIAGNOSIS — E785 Hyperlipidemia, unspecified: Secondary | ICD-10-CM | POA: Diagnosis not present

## 2022-01-06 DIAGNOSIS — K219 Gastro-esophageal reflux disease without esophagitis: Secondary | ICD-10-CM | POA: Diagnosis not present

## 2022-01-09 ENCOUNTER — Encounter
Admission: RE | Disposition: A | Discharge status: Home / Self Care | Payer: Self-pay | Source: Home / Self Care | Attending: Gastroenterology

## 2022-01-09 ENCOUNTER — Other Ambulatory Visit: Payer: Self-pay

## 2022-01-09 ENCOUNTER — Ambulatory Visit: Payer: PPO | Admitting: Anesthesiology

## 2022-01-09 ENCOUNTER — Ambulatory Visit (AMBULATORY_SURGERY_CENTER): Payer: PPO | Admitting: Anesthesiology

## 2022-01-09 ENCOUNTER — Ambulatory Visit
Admission: RE | Admit: 2022-01-09 | Discharge: 2022-01-09 | Disposition: A | Discharge status: Home / Self Care | Payer: PPO | Attending: Gastroenterology | Admitting: Gastroenterology

## 2022-01-09 ENCOUNTER — Encounter: Payer: Self-pay | Admitting: Gastroenterology

## 2022-01-09 DIAGNOSIS — N189 Chronic kidney disease, unspecified: Secondary | ICD-10-CM | POA: Diagnosis not present

## 2022-01-09 DIAGNOSIS — G709 Myoneural disorder, unspecified: Secondary | ICD-10-CM | POA: Insufficient documentation

## 2022-01-09 DIAGNOSIS — K640 First degree hemorrhoids: Secondary | ICD-10-CM | POA: Diagnosis not present

## 2022-01-09 DIAGNOSIS — K635 Polyp of colon: Secondary | ICD-10-CM | POA: Diagnosis not present

## 2022-01-09 DIAGNOSIS — K219 Gastro-esophageal reflux disease without esophagitis: Secondary | ICD-10-CM | POA: Insufficient documentation

## 2022-01-09 DIAGNOSIS — K648 Other hemorrhoids: Secondary | ICD-10-CM

## 2022-01-09 DIAGNOSIS — D120 Benign neoplasm of cecum: Secondary | ICD-10-CM | POA: Diagnosis not present

## 2022-01-09 DIAGNOSIS — J45909 Unspecified asthma, uncomplicated: Secondary | ICD-10-CM | POA: Diagnosis not present

## 2022-01-09 DIAGNOSIS — F32A Depression, unspecified: Secondary | ICD-10-CM | POA: Diagnosis not present

## 2022-01-09 DIAGNOSIS — M1990 Unspecified osteoarthritis, unspecified site: Secondary | ICD-10-CM | POA: Insufficient documentation

## 2022-01-09 DIAGNOSIS — Z1211 Encounter for screening for malignant neoplasm of colon: Secondary | ICD-10-CM | POA: Insufficient documentation

## 2022-01-09 DIAGNOSIS — Z860100 Personal history of colon polyps, unspecified: Secondary | ICD-10-CM

## 2022-01-09 DIAGNOSIS — Z87891 Personal history of nicotine dependence: Secondary | ICD-10-CM | POA: Insufficient documentation

## 2022-01-09 DIAGNOSIS — G4730 Sleep apnea, unspecified: Secondary | ICD-10-CM | POA: Insufficient documentation

## 2022-01-09 HISTORY — DX: Sleep apnea, unspecified: G47.30

## 2022-01-09 HISTORY — PX: COLONOSCOPY WITH PROPOFOL: SHX5780

## 2022-01-09 HISTORY — DX: Other specified disorders of bone density and structure, unspecified site: M85.80

## 2022-01-09 SURGERY — COLONOSCOPY WITH PROPOFOL
Anesthesia: General

## 2022-01-09 MED ORDER — LACTATED RINGERS IV SOLN: INTRAVENOUS | Status: DC

## 2022-01-09 MED ORDER — ONDANSETRON HCL 4 MG/2ML IJ SOLN: 4.0000 mg | Freq: Once | INTRAMUSCULAR | Status: DC | PRN

## 2022-01-09 MED ORDER — FENTANYL CITRATE PF 50 MCG/ML IJ SOSY: 25.0000 ug | PREFILLED_SYRINGE | INTRAMUSCULAR | Status: DC | PRN

## 2022-01-09 MED ORDER — SODIUM CHLORIDE 0.9 % IV SOLN: INTRAVENOUS | Status: DC

## 2022-01-09 MED ORDER — PROPOFOL 10 MG/ML IV BOLUS
INTRAVENOUS | Status: DC | PRN
  Administered 2022-01-09: 200 mg via INTRAVENOUS

## 2022-01-09 MED ORDER — STERILE WATER FOR IRRIGATION IR SOLN
Status: DC | PRN
  Administered 2022-01-09: 1000 mL

## 2022-01-09 SURGICAL SUPPLY — 24 items
CLIP HMST 235XBRD CATH ROT (MISCELLANEOUS) IMPLANT
CLIP RESOLUTION 360 11X235 (MISCELLANEOUS) ×1
ELECT REM PT RETURN 9FT ADLT (ELECTROSURGICAL)
ELECTRODE REM PT RTRN 9FT ADLT (ELECTROSURGICAL) IMPLANT
FORCEPS BIOP RAD 4 LRG CAP 4 (CUTTING FORCEPS) IMPLANT
GOWN CVR UNV OPN BCK APRN NK (MISCELLANEOUS) ×2 IMPLANT
GOWN ISOL THUMB LOOP REG UNIV (MISCELLANEOUS) ×2
INJECTABLE ELEVIEW COMP 10 (MISCELLANEOUS) IMPLANT
INJECTOR VARIJECT VIN23 (MISCELLANEOUS) IMPLANT
KIT DEFENDO VALVE AND CONN (KITS) IMPLANT
KIT PRC NS LF DISP ENDO (KITS) ×1 IMPLANT
KIT PROCEDURE OLYMPUS (KITS) ×1
MANIFOLD NEPTUNE II (INSTRUMENTS) ×1 IMPLANT
MARKER SPOT ENDO TATTOO 5ML (MISCELLANEOUS) IMPLANT
NDL CARR LOCKE SCLERO (NEEDLE) IMPLANT
NEEDLE CARR LOCKE SCLERO (NEEDLE) ×1 IMPLANT
PROBE APC STR FIRE (PROBE) IMPLANT
RETRIEVER NET ROTH 2.5X230 LF (MISCELLANEOUS) IMPLANT
SNARE COLD EXACTO (MISCELLANEOUS) IMPLANT
SNARE SHORT THROW 13M SML OVAL (MISCELLANEOUS) IMPLANT
SNARE SNG USE RND 15MM (INSTRUMENTS) IMPLANT
TRAP ETRAP POLY (MISCELLANEOUS) IMPLANT
VARIJECT INJECTOR VIN23 (MISCELLANEOUS)
WATER STERILE IRR 250ML POUR (IV SOLUTION) ×1 IMPLANT

## 2022-01-09 NOTE — Anesthesia Postprocedure Evaluation (Signed)
Anesthesia Post Note  Patient: Rebekah Johnston  Procedure(s) Performed: COLONOSCOPY POLYPECTOMY WITH PROPOFOL CLIP APPLIED AT [facility]     Patient location during evaluation: PACU Anesthesia Type: General Level of consciousness: awake and alert Pain management: pain level controlled Vital Signs Assessment: post-procedure vital signs reviewed and stable Respiratory status: spontaneous breathing, nonlabored ventilation, respiratory function stable and patient connected to nasal cannula oxygen Cardiovascular status: blood pressure returned to baseline and stable Postop Assessment: no apparent nausea or vomiting Anesthetic complications: no   No notable events documented.  Molli Barrows

## 2022-01-09 NOTE — H&P (Signed)
Rebekah Lame, MD [address] [place], [place] [address] Phone:[phone] Fax : [phone]  Primary Care Physician:  Jinny Sanders, MD Primary Gastroenterologist:  Dr. Allen Norris  Pre-Procedure History & Physical: HPI:  Rebekah Johnston is a 71 y.o. female is here for an colonoscopy.   Past Medical History:  Diagnosis Date   Asthma    no inhalers   Bronchitis    taking meds now   Chronic kidney disease 2008   Stage G3b/A1   Depression    GERD (gastroesophageal reflux disease)    Glaucoma    no eye drops   Hyperlipidemia    no meds   Macular degeneration    Bil   Osteoarthritis    Osteopenia    Sleep apnea    No CPAP    Past Surgical History:  Procedure Laterality Date   BUNIONECTOMY     rt. foot   CHOLECYSTECTOMY     HERNIA REPAIR     left inguinal hernia   TONSILLECTOMY  1967    Prior to Admission medications   Medication Sig Start Date End Date Taking? Authorizing Provider  acetaminophen (TYLENOL) 325 MG tablet Take 650 mg by mouth every 6 (six) hours as needed.   Yes [provider]  citalopram (CELEXA) 40 MG tablet TAKE 1 TABLET BY MOUTH EVERY DAY 11/27/21  Yes Bedsole, Amy E, MD  diclofenac Sodium (VOLTAREN) 1 % GEL    Yes [provider]  pantoprazole (PROTONIX) 40 MG tablet Take 40 mg by mouth daily. 02/16/15  Yes [provider]  rosuvastatin (CRESTOR) 5 MG tablet TAKE 1 TABLET BY MOUTH EVERY DAY 12/23/21  Yes Bedsole, Amy E, MD  spironolactone (ALDACTONE) 25 MG tablet Take 12.5 mg by mouth daily. 06/20/21  Yes [provider]  albuterol (PROAIR HFA) 108 (90 Base) MCG/ACT inhaler INHALE 1-2 PUFFS INTO THE LUNGS EVERY 6 HOURS AS NEEDED FOR WHEEZING AND SHORTNESS OF BREATH Patient not taking: Reported on 11/17/2021    [provider]  Influenza vac split quadrivalent PF (FLUZONE HIGH-DOSE) 0.5 ML injection     [provider]  ipratropium (ATROVENT) 0.06 % nasal spray Place 2 sprays into  both nostrils 4 (four) times daily. Patient not taking: Reported on 01/04/2022 06/24/21   Margarette Canada, NP  metoprolol tartrate (LOPRESSOR) 25 MG tablet USE AS DIRECTED, TAKE 1 TABLET BY MOUTH NIGHT BEFORE AND 1 TABLET 90 MINUTES PRIOR TO CTA Patient not taking: Reported on 11/17/2021    [provider]  nystatin cream (MYCOSTATIN)     [provider]    Allergies as of 11/18/2021   (No Known Allergies)    Family History  Problem Relation Age of Onset   Prostate cancer Father    Dementia Mother    Diabetes Brother    Melanoma Other        Uncle   Colon cancer Neg Hx    Esophageal cancer Neg Hx    Rectal cancer Neg Hx    Stomach cancer Neg Hx     Social History   Socioeconomic History   Marital status: Divorced    Spouse name: Not on file   Number of children: 3   Years of education: Not on file   Highest education level: Not on file  Occupational History   Occupation: Academic librarian: UNEMPLOYED  Tobacco Use   Smoking status: Former    Packs/day: 0.25    Years: 3.00  Total pack years: 0.75    Types: Cigarettes    Quit date: 05/23/2001    Years since quitting: 20.6   Smokeless tobacco: Never   Tobacco comments:    smoked socially not everyday thing.  Vaping Use   Vaping Use: Never used  Substance and Sexual Activity   Alcohol use: Yes    Alcohol/week: 1.0 standard drink of alcohol    Types: 1 Glasses of wine per week    Comment: occasional once a month   Drug use: No   Sexual activity: Not on file  Other Topics Concern   Not on file  Social History Narrative   Daily caffeine   Social Determinants of Health   Financial Resource Strain: Low Risk  (11/17/2021)   Overall Financial Resource Strain (CARDIA)    Difficulty of Paying Living Expenses: Not hard at all  Food Insecurity: No Food Insecurity (11/17/2021)   Hunger Vital Sign    Worried About Running Out of Food in the Last Year: Never true    Ran Out of Food in the Last Year: Never  true  Transportation Needs: No Transportation Needs (11/17/2021)   PRAPARE - Hydrologist (Medical): No    Lack of Transportation (Non-Medical): No  Physical Activity: Insufficiently Active (11/17/2021)   Exercise Vital Sign    Days of Exercise per Week: 3 days    Minutes of Exercise per Session: 30 min  Stress: No Stress Concern Present (11/17/2021)   [place]    Feeling of Stress : Only a little  Social Connections: Moderately Isolated (11/17/2021)   Social Connection and Isolation Panel [NHANES]    Frequency of Communication with Friends and Family: More than three times a week    Frequency of Social Gatherings with Friends and Family: Twice a week    Attends Religious Services: More than 4 times per year    Active Member of [employer] or Organizations: No    Attends Archivist Meetings: Never    Marital Status: Divorced  Human resources officer Violence: Not At Risk (11/17/2021)   Humiliation, Afraid, Rape, and Kick questionnaire    Fear of Current or Ex-Partner: No    Emotionally Abused: No    Physically Abused: No    Sexually Abused: No    Review of Systems: See HPI, otherwise negative ROS  Physical Exam: BP 120/72   Pulse (!) 59   Temp 97.7 F (36.5 C) (Temporal)   Ht '5\' 3"'$  (1.6 m)   Wt 80.4 kg   SpO2 95%   BMI 31.39 kg/m  General:   Alert,  pleasant and cooperative in NAD Head:  Normocephalic and atraumatic. Neck:  Supple; no masses or thyromegaly. Lungs:  Clear throughout to auscultation.    Heart:  Regular rate and rhythm. Abdomen:  Soft, nontender and nondistended. Normal bowel sounds, without guarding, and without rebound.   Neurologic:  Alert and  oriented x4;  grossly normal neurologically.  Impression/Plan: Rebekah Johnston is here for an colonoscopy to be performed for a history of adenomatous polyps on 2018   Risks, benefits, limitations, and alternatives  regarding  colonoscopy have been reviewed with the patient.  Questions have been answered.  All parties agreeable.   Rebekah Lame, MD  01/09/2022, 8:41 AM

## 2022-01-09 NOTE — Op Note (Signed)
[facility] Gastroenterology Patient Name: Rebekah Johnston Procedure Date: 01/09/2022 8:43 AM MRN: [id number] Account #: [id number] Date of Birth: 1950-12-02 Admit Type: Outpatient Age: 71 Room: [facility] OR ROOM 01 Gender: Female Note Status: Finalized Instrument Name: [id number] Procedure:             Colonoscopy Indications:           High risk colon cancer surveillance: Personal history                         of colonic polyps Providers:             Lucilla Lame MD, MD Referring MD:          Jinny Sanders MD, MD (Referring MD) Medicines:             Propofol per Anesthesia Complications:         No immediate complications. Procedure:             Pre-Anesthesia Assessment:                        - Prior to the procedure, a History and Physical was                         performed, and patient medications and allergies were                         reviewed. The patient's tolerance of previous                         anesthesia was also reviewed. The risks and benefits                         of the procedure and the sedation options and risks                         were discussed with the patient. All questions were                         answered, and informed consent was obtained. Prior                         Anticoagulants: The patient has taken no previous                         anticoagulant or antiplatelet agents. ASA Grade                         Assessment: II - A patient with mild systemic disease.                         After reviewing the risks and benefits, the patient                         was deemed in satisfactory condition to undergo the                         procedure.  After obtaining informed consent, the colonoscope was                         passed under direct vision. Throughout the procedure,                         the patient's blood pressure, pulse, and oxygen                         saturations were monitored  continuously. The                         Colonoscope was introduced through the anus and                         advanced to the the cecum, identified by appendiceal                         orifice and ileocecal valve. The colonoscopy was                         performed without difficulty. The patient tolerated                         the procedure well. The quality of the bowel                         preparation was excellent. Findings:      The perianal and digital rectal examinations were normal.      A 9 mm polyp was found in the cecum. The polyp was sessile. Preparations       were made for mucosal resection. Chromoscopy with indigo carmine was       done to mark the borders of the lesion. Saline with indigo carmine was       injected to raise the lesion. Snare mucosal resection was performed.       Resection and retrieval were complete. To close a defect after       polypectomy, one hemostatic clip was successfully placed (MR       conditional). There was no bleeding at the end of the procedure.      Non-bleeding internal hemorrhoids were found during retroflexion. The       hemorrhoids were Grade I (internal hemorrhoids that do not prolapse). Impression:            - One 9 mm polyp in the cecum, removed with mucosal                         resection. Resected and retrieved. Clip (MR                         conditional) was placed.                        - Non-bleeding internal hemorrhoids.                        - Mucosal resection was performed. Resection and  retrieval were complete. Recommendation:        - Discharge patient to home.                        - Resume previous diet.                        - Continue present medications.                        - Await pathology results.                        - Repeat colonoscopy in 5 years for surveillance. Procedure Code(s):     --- Professional ---                        [phone], Colonoscopy, flexible;  with endoscopic mucosal                         resection Diagnosis Code(s):     --- Professional ---                        Z86.010, Personal history of colonic polyps                        K63.5, Polyp of colon CPT copyright 2019 American Medical Association. All rights reserved. The codes documented in this report are preliminary and upon coder review may  be revised to meet current compliance requirements. Lucilla Lame MD, MD 01/09/2022 9:07:10 AM This report has been signed electronically. Number of Addenda: 0 Note Initiated On: 01/09/2022 8:43 AM Scope Withdrawal Time: 0 hours 11 minutes 6 seconds  Total Procedure Duration: 0 hours 15 minutes 12 seconds  Estimated Blood Loss:  Estimated blood loss: none.      [facility]

## 2022-01-09 NOTE — Transfer of Care (Signed)
Immediate Anesthesia Transfer of Care Note  Patient: PARALEE PENDERGRASS  Procedure(s) Performed: COLONOSCOPY POLYPECTOMY WITH PROPOFOL CLIP APPLIED AT CECAL POLYP SITE  Patient Location: PACU  Anesthesia Type: General  Level of Consciousness: awake, alert  and patient cooperative  Airway and Oxygen Therapy: Patient Spontanous Breathing and Patient connected to supplemental oxygen  Post-op Assessment: Post-op Vital signs reviewed, Patient's Cardiovascular Status Stable, Respiratory Function Stable, Patent Airway and No signs of Nausea or vomiting  Post-op Vital Signs: Reviewed and stable  Complications: No notable events documented.

## 2022-01-09 NOTE — Anesthesia Preprocedure Evaluation (Addendum)
Anesthesia Evaluation  Patient identified by MRN, date of birth, ID band Patient awake    Reviewed: Allergy & Precautions, H&P , NPO status , Patient's Chart, lab work & pertinent test results, reviewed documented beta blocker date and time   Airway Mallampati: II   Neck ROM: full    Dental  (+) Poor Dentition   Pulmonary neg pulmonary ROS, asthma , sleep apnea , former smoker,    Pulmonary exam normal        Cardiovascular Exercise Tolerance: Poor negative cardio ROS Normal cardiovascular exam Rhythm:regular Rate:Normal     Neuro/Psych PSYCHIATRIC DISORDERS Depression  Neuromuscular disease negative neurological ROS  negative psych ROS   GI/Hepatic negative GI ROS, Neg liver ROS, GERD  Medicated,  Endo/Other  negative endocrine ROS  Renal/GU Renal diseasenegative Renal ROS  negative genitourinary   Musculoskeletal  (+) Arthritis ,   Abdominal   Peds  Hematology negative hematology ROS (+)   Anesthesia Other Findings Past Medical History: No date: Asthma     Comment:  no inhalers No date: Bronchitis     Comment:  taking meds now 2008: Chronic kidney disease     Comment:  Stage G3b/A1 No date: Depression No date: GERD (gastroesophageal reflux disease) No date: Glaucoma     Comment:  no eye drops No date: Hyperlipidemia     Comment:  no meds No date: Macular degeneration     Comment:  Bil No date: Osteoarthritis No date: Osteopenia No date: Sleep apnea     Comment:  No CPAP Past Surgical History: No date: BUNIONECTOMY     Comment:  rt. foot No date: CHOLECYSTECTOMY No date: HERNIA REPAIR     Comment:  left inguinal hernia 1967: TONSILLECTOMY BMI    Body Mass Index: 32.59 kg/m     Reproductive/Obstetrics negative OB ROS                            Anesthesia Physical Anesthesia Plan  ASA: 3  Anesthesia Plan: General   Post-op Pain Management:    Induction:   PONV  Risk Score and Plan:   Airway Management Planned:   Additional Equipment:   Intra-op Plan:   Post-operative Plan:   Informed Consent: I have reviewed the patients History and Physical, chart, labs and discussed the procedure including the risks, benefits and alternatives for the proposed anesthesia with the patient or authorized representative who has indicated his/her understanding and acceptance.     Dental Advisory Given  Plan Discussed with: CRNA  Anesthesia Plan Comments:         Anesthesia Quick Evaluation

## 2022-01-10 ENCOUNTER — Encounter: Payer: Self-pay | Admitting: Gastroenterology

## 2022-01-11 LAB — SURGICAL PATHOLOGY

## 2022-01-12 ENCOUNTER — Encounter: Payer: Self-pay | Admitting: Gastroenterology

## 2022-01-18 ENCOUNTER — Telehealth: Payer: Self-pay | Admitting: Family Medicine

## 2022-01-18 NOTE — Telephone Encounter (Signed)
Please call back and schedule appointment.  Must be seen before anything can be prescribed for UTI.

## 2022-01-18 NOTE — Telephone Encounter (Signed)
Patient called in stating she believes she have an UTI. She was wanting to know if Dr. Diona Browner could call something in without her coming in. Please advise. Thank you!

## 2022-01-19 ENCOUNTER — Ambulatory Visit (INDEPENDENT_AMBULATORY_CARE_PROVIDER_SITE_OTHER): Payer: PPO | Admitting: Family Medicine

## 2022-01-19 VITALS — BP 128/70 | HR 75 | Temp 96.7°F | Resp 14 | Ht 63.0 in | Wt 186.2 lb

## 2022-01-19 DIAGNOSIS — N3001 Acute cystitis with hematuria: Secondary | ICD-10-CM

## 2022-01-19 DIAGNOSIS — R300 Dysuria: Secondary | ICD-10-CM | POA: Diagnosis not present

## 2022-01-19 LAB — POC URINALSYSI DIPSTICK (AUTOMATED)
Bilirubin, UA: NEGATIVE
Glucose, UA: NEGATIVE
Ketones, UA: NEGATIVE
Nitrite, UA: POSITIVE
Protein, UA: POSITIVE — AB
Spec Grav, UA: 1.025 (ref 1.010–1.025)
Urobilinogen, UA: 0.2 E.U./dL
pH, UA: 6 (ref 5.0–8.0)

## 2022-01-19 MED ORDER — SULFAMETHOXAZOLE-TRIMETHOPRIM 800-160 MG PO TABS: 1.0000 | ORAL_TABLET | Freq: Two times a day (BID) | ORAL | 0 refills | Status: DC

## 2022-01-19 NOTE — Progress Notes (Signed)
Patient ID: Rebekah Johnston, female    DOB: 07/13/1950, 71 y.o.   MRN: [id number]  This visit was conducted in person.  BP 128/70   Pulse 75   Temp (!) 96.7 F (35.9 C) (Temporal)   Resp 14   Ht '5\' 3"'$  (1.6 m)   Wt 186 lb 4 oz (84.5 kg)   SpO2 97%   BMI 32.99 kg/m    CC:  Chief Complaint  Patient presents with   urgency to urinate    Sx started around 01/15/22 Urgency and frequency to urinate, slight pain with urination.    Subjective:   HPI: Rebekah Johnston is a 71 y.o. female presenting on 01/19/2022 for urgency to urinate (Sx started around 01/15/22 Urgency and frequency to urinate, slight pain with urination.)   Urinary Tract Infection  This is a new problem. The current episode started in the past 7 days (5 days). The problem has been unchanged. The patient is experiencing no pain. There has been no fever. Associated symptoms include frequency and urgency. Pertinent negatives include no chills, discharge, flank pain, hematuria, hesitancy, nausea, sweats or vomiting. She has tried increased fluids for the symptoms. The treatment provided no relief. There is no history of catheterization, kidney stones, a single kidney, urinary stasis or a urological procedure. colonoscopy last week    No antibiotics in last month.      Relevant past medical, surgical, family and social history reviewed and updated as indicated. Interim medical history since our last visit reviewed. Allergies and medications reviewed and updated. Outpatient Medications Prior to Visit  Medication Sig Dispense Refill   acetaminophen (TYLENOL) 325 MG tablet Take 650 mg by mouth every 6 (six) hours as needed.     citalopram (CELEXA) 40 MG tablet TAKE 1 TABLET BY MOUTH EVERY DAY 90 tablet 1   diclofenac Sodium (VOLTAREN) 1 % GEL      ipratropium (ATROVENT) 0.06 % nasal spray Place 2 sprays into both nostrils 4 (four) times daily. 15 mL 12   nystatin cream (MYCOSTATIN)      pantoprazole (PROTONIX) 40 MG  tablet Take 40 mg by mouth daily.  11   rosuvastatin (CRESTOR) 5 MG tablet TAKE 1 TABLET BY MOUTH EVERY DAY 90 tablet 0   spironolactone (ALDACTONE) 25 MG tablet Take 12.5 mg by mouth daily.     albuterol (PROAIR HFA) 108 (90 Base) MCG/ACT inhaler INHALE 1-2 PUFFS INTO THE LUNGS EVERY 6 HOURS AS NEEDED FOR WHEEZING AND SHORTNESS OF BREATH (Patient not taking: Reported on 11/17/2021)     Influenza vac split quadrivalent PF (FLUZONE HIGH-DOSE) 0.5 ML injection      metoprolol tartrate (LOPRESSOR) 25 MG tablet USE AS DIRECTED, TAKE 1 TABLET BY MOUTH NIGHT BEFORE AND 1 TABLET 90 MINUTES PRIOR TO CTA (Patient not taking: Reported on 11/17/2021)     No facility-administered medications prior to visit.     Per HPI unless specifically indicated in ROS section below Review of Systems  Constitutional:  Negative for chills.  Gastrointestinal:  Negative for nausea and vomiting.  Genitourinary:  Positive for frequency and urgency. Negative for flank pain, hematuria and hesitancy.   Objective:  BP 128/70   Pulse 75   Temp (!) 96.7 F (35.9 C) (Temporal)   Resp 14   Ht '5\' 3"'$  (1.6 m)   Wt 186 lb 4 oz (84.5 kg)   SpO2 97%   BMI 32.99 kg/m   Wt Readings from Last 3 Encounters:  01/19/22 186  lb 4 oz (84.5 kg)  01/09/22 177 lb 3.2 oz (80.4 kg)  11/17/21 184 lb (83.5 kg)      Physical Exam Constitutional:      General: She is not in acute distress.    Appearance: Normal appearance. She is well-developed. She is not ill-appearing or toxic-appearing.  HENT:     Head: Normocephalic.     Right Ear: Hearing, tympanic membrane, ear canal and external ear normal. Tympanic membrane is not erythematous, retracted or bulging.     Left Ear: Hearing, tympanic membrane, ear canal and external ear normal. Tympanic membrane is not erythematous, retracted or bulging.     Nose: No mucosal edema or rhinorrhea.     Right Sinus: No maxillary sinus tenderness or frontal sinus tenderness.     Left Sinus: No maxillary  sinus tenderness or frontal sinus tenderness.     Mouth/Throat:     Pharynx: Uvula midline.  Eyes:     General: Lids are normal. Lids are everted, no foreign bodies appreciated.     Conjunctiva/sclera: Conjunctivae normal.     Pupils: Pupils are equal, round, and reactive to light.  Neck:     Thyroid: No thyroid mass or thyromegaly.     Vascular: No carotid bruit.     Trachea: Trachea normal.  Cardiovascular:     Rate and Rhythm: Normal rate and regular rhythm.     Pulses: Normal pulses.     Heart sounds: Normal heart sounds, S1 normal and S2 normal. No murmur heard.    No friction rub. No gallop.  Pulmonary:     Effort: Pulmonary effort is normal. No tachypnea or respiratory distress.     Breath sounds: Normal breath sounds. No decreased breath sounds, wheezing, rhonchi or rales.  Abdominal:     General: Bowel sounds are normal.     Palpations: Abdomen is soft.     Tenderness: There is abdominal tenderness in the suprapubic area.  Musculoskeletal:     Cervical back: Normal range of motion and neck supple.  Skin:    General: Skin is warm and dry.     Findings: No rash.  Neurological:     Mental Status: She is alert.  Psychiatric:        Mood and Affect: Mood is not anxious or depressed.        Speech: Speech normal.        Behavior: Behavior normal. Behavior is cooperative.        Thought Content: Thought content normal.        Judgment: Judgment normal.       Results for orders placed or performed during the hospital encounter of 01/09/22  Surgical pathology  Result Value Ref Range   SURGICAL PATHOLOGY      SURGICAL PATHOLOGY CASE: [phone] PATIENT: Rebekah Johnston Surgical Pathology Report     Specimen Submitted: A. Colon polyp, cecum; cold snare  Clinical History: Personal history of colon polyps.  Cecal colon polyp      DIAGNOSIS: A.  COLON, CECUM, POLYP; COLD SNARE BIOPSIES: - MULTIPLE FRAGMENTS OF TUBULAR ADENOMA. - NO EVIDENCE OF HIGH-GRADE  DYSPLASIA OR MALIGNANCY.  GROSS DESCRIPTION: A. Labeled: Cecal polyp cold snare Received: Formalin Collection time: 8:59 AM on 01/09/2022 Placed into formalin time: 8:59 AM on 01/09/2022 Tissue fragment(s): Multiple Size: Aggregate, 1.7 x 0.5 x 0.2 cm Description: Received are fragments of tan soft tissue admixed with intestinal debris.  The ratio of soft tissue to intestinal debris is 80: 20. Entirely  submitted in 1 cassette.  RB 01/10/2022  Final Diagnosis performed by Theodora Blow, MD.   Electronically signed 01/11/2022 12:29:59PM The electronic signature indicates that the named Attending Pathologist has  evaluated the specimen Technical component performed at [facility], [address], [place], [place] [address] Lab: [phone] Dir: Rush Farmer, MD, MMM  Professional component performed at [facility], [facility], [place], [place], [place] [address] Lab: [phone] Dir: Kathi Simpers, MD      COVID 19 screen:  No recent travel or known exposure to [place] The patient denies respiratory symptoms of COVID 19 at this time. The importance of social distancing was discussed today.   Assessment and Plan Problem List Items Addressed This Visit     Acute cystitis with hematuria    Acute, We will send urine for culture.  Very typical symptoms of UTI.  We will treat with Bactrim double strength 1 tablet twice daily x3 days.  Continue to push fluids.  Reviewed return and ER precautions in detail with patient.      Other Visit Diagnoses     Dysuria    -  Primary   Relevant Orders   POCT Urinalysis Dipstick (Automated) (Completed)   Urine Culture      Meds ordered this encounter  Medications   sulfamethoxazole-trimethoprim (BACTRIM DS) 800-160 MG tablet    Sig: Take 1 tablet by mouth 2 (two) times daily.    Dispense:  6 tablet    Refill:  0       Eliezer Lofts, MD

## 2022-01-19 NOTE — Assessment & Plan Note (Signed)
Acute, We will send urine for culture.  Very typical symptoms of UTI.  We will treat with Bactrim double strength 1 tablet twice daily x3 days.  Continue to push fluids.  Reviewed return and ER precautions in detail with patient.

## 2022-01-22 LAB — URINE CULTURE
MICRO NUMBER:: 13950102
SPECIMEN QUALITY:: ADEQUATE

## 2022-02-08 DIAGNOSIS — R0789 Other chest pain: Secondary | ICD-10-CM | POA: Diagnosis not present

## 2022-02-13 DIAGNOSIS — K219 Gastro-esophageal reflux disease without esophagitis: Secondary | ICD-10-CM | POA: Diagnosis not present

## 2022-02-13 DIAGNOSIS — R0602 Shortness of breath: Secondary | ICD-10-CM | POA: Diagnosis not present

## 2022-02-13 DIAGNOSIS — R079 Chest pain, unspecified: Secondary | ICD-10-CM | POA: Diagnosis not present

## 2022-02-13 DIAGNOSIS — E782 Mixed hyperlipidemia: Secondary | ICD-10-CM | POA: Diagnosis not present

## 2022-03-18 ENCOUNTER — Telehealth: Payer: Self-pay | Admitting: Family Medicine

## 2022-03-18 DIAGNOSIS — E559 Vitamin D deficiency, unspecified: Secondary | ICD-10-CM

## 2022-03-18 DIAGNOSIS — R7309 Other abnormal glucose: Secondary | ICD-10-CM

## 2022-03-18 DIAGNOSIS — E7800 Pure hypercholesterolemia, unspecified: Secondary | ICD-10-CM

## 2022-03-18 NOTE — Telephone Encounter (Signed)
-----   Message from Katherine A Causey, RT sent at 03/15/2022 11:21 AM EST ----- Regarding: Thur 11/30 lab Patient is scheduled for cpx, please order future labs.  Thanks, Kate   

## 2022-03-30 ENCOUNTER — Other Ambulatory Visit: Payer: PPO

## 2022-03-31 ENCOUNTER — Other Ambulatory Visit (INDEPENDENT_AMBULATORY_CARE_PROVIDER_SITE_OTHER): Payer: PPO

## 2022-03-31 DIAGNOSIS — E7800 Pure hypercholesterolemia, unspecified: Secondary | ICD-10-CM

## 2022-03-31 DIAGNOSIS — R7309 Other abnormal glucose: Secondary | ICD-10-CM

## 2022-03-31 DIAGNOSIS — E559 Vitamin D deficiency, unspecified: Secondary | ICD-10-CM

## 2022-03-31 LAB — LIPID PANEL
Cholesterol: 181 mg/dL (ref 0–200)
HDL: 56.6 mg/dL (ref >39.00)
LDL Cholesterol: 109 mg/dL — ABNORMAL HIGH (ref 0–99)
NonHDL: 123.96
Total CHOL/HDL Ratio: 3
Triglycerides: 73 mg/dL (ref 0.0–149.0)
VLDL: 14.6 mg/dL (ref 0.0–40.0)

## 2022-03-31 LAB — COMPREHENSIVE METABOLIC PANEL
ALT: 11 U/L (ref 0–35)
AST: 16 U/L (ref 0–37)
Albumin: 4.1 g/dL (ref 3.5–5.2)
Alkaline Phosphatase: 81 U/L (ref 39–117)
BUN: 14 mg/dL (ref 6–23)
CO2: 30 mEq/L (ref 19–32)
Calcium: 9.8 mg/dL (ref 8.4–10.5)
Chloride: 105 mEq/L (ref 96–112)
Creatinine, Ser: 1.2 mg/dL (ref 0.40–1.20)
GFR: 45.24 mL/min — ABNORMAL LOW (ref >60.00)
Glucose, Bld: 126 mg/dL — ABNORMAL HIGH (ref 70–99)
Potassium: 5.3 mEq/L — ABNORMAL HIGH (ref 3.5–5.1)
Sodium: 141 mEq/L (ref 135–145)
Total Bilirubin: 0.6 mg/dL (ref 0.2–1.2)
Total Protein: 6.3 g/dL (ref 6.0–8.3)

## 2022-03-31 LAB — VITAMIN D 25 HYDROXY (VIT D DEFICIENCY, FRACTURES): VITD: 31.76 ng/mL (ref 30.00–100.00)

## 2022-03-31 LAB — HEMOGLOBIN A1C: Hgb A1c MFr Bld: 6.1 % (ref 4.6–6.5)

## 2022-04-06 ENCOUNTER — Ambulatory Visit (INDEPENDENT_AMBULATORY_CARE_PROVIDER_SITE_OTHER): Payer: PPO | Admitting: Family Medicine

## 2022-04-06 ENCOUNTER — Encounter: Payer: Self-pay | Admitting: Family Medicine

## 2022-04-06 VITALS — BP 140/90 | HR 54 | Temp 98.7°F | Ht 63.0 in | Wt 184.2 lb

## 2022-04-06 DIAGNOSIS — N1832 Chronic kidney disease, stage 3b: Secondary | ICD-10-CM | POA: Diagnosis not present

## 2022-04-06 DIAGNOSIS — E875 Hyperkalemia: Secondary | ICD-10-CM | POA: Diagnosis not present

## 2022-04-06 DIAGNOSIS — F331 Major depressive disorder, recurrent, moderate: Secondary | ICD-10-CM | POA: Diagnosis not present

## 2022-04-06 DIAGNOSIS — Z23 Encounter for immunization: Secondary | ICD-10-CM

## 2022-04-06 DIAGNOSIS — R7309 Other abnormal glucose: Secondary | ICD-10-CM

## 2022-04-06 DIAGNOSIS — Z0000 Encounter for general adult medical examination without abnormal findings: Secondary | ICD-10-CM

## 2022-04-06 DIAGNOSIS — E559 Vitamin D deficiency, unspecified: Secondary | ICD-10-CM | POA: Diagnosis not present

## 2022-04-06 DIAGNOSIS — E7800 Pure hypercholesterolemia, unspecified: Secondary | ICD-10-CM

## 2022-04-06 NOTE — Assessment & Plan Note (Signed)
Chronic, slightly worsening control despite Crestor 10 mg p.o. daily.  She will get back on track with healthy lifestyle changes and we will reevaluate in 3 months.

## 2022-04-06 NOTE — Assessment & Plan Note (Signed)
Resolved with supplementation

## 2022-04-06 NOTE — Patient Instructions (Addendum)
Please stop at the lab to have labs drawn.  Remain off spironolactone until labs are back  and we speak with Dr. Humphrey Rolls.  Add back some type of cardio exercise 3-5 days a week, 30-50 min at time.  Work on low cholesterol low carb diet.  Return for labs only visit for cholesterol recheck in 3 months.  Get BP cuff , arm. Check blood pressure at home... goal < 140/90.

## 2022-04-06 NOTE — Assessment & Plan Note (Signed)
Stable, chronic.  Continue current medication.  Celexa 40 mg p.o. daily

## 2022-04-06 NOTE — Progress Notes (Signed)
Patient ID: Rebekah Johnston, female    DOB: 1950/05/28, 71 y.o.   MRN: [id number]  This visit was conducted in person.  BP (!) 140/90   Pulse (!) 54   Temp 98.7 F (37.1 C) (Oral)   Ht '5\' 3"'$  (1.6 m)   Wt 184 lb 4 oz (83.6 kg)   SpO2 98%   BMI 32.64 kg/m    CC:  Chief Complaint  Patient presents with   Annual Exam    Part 2    Subjective:   HPI: Rebekah Johnston is a 71 y.o. female presenting on 04/06/2022 for Annual Exam (Part 2)  The patient presents for annual medicare wellness, complete physical and review of chronic health problems. He/She also has the following acute concerns today: none  The patient saw a LPN or RN for medicare wellness visit.  Prevention and wellness was reviewed in detail. Note reviewed and important notes copied below.  CKD, stable GFR 45  but K elevated at  5.3.  On spironolactone for  Dr. Humphrey Rolls, cardiologist.. not sure why on. No dx heart  failure or swelling.   BP Readings from Last 3 Encounters:  04/06/22 (!) 140/90  01/19/22 128/70  01/09/22 114/69   Not checking BP at home   Elevated Cholesterol: Slightly worse on rosuvastatin 10 mg Lab Results  Component Value Date   CHOL 181 03/31/2022   HDL 56.60 03/31/2022   LDLCALC 109 (H) 03/31/2022   LDLDIRECT 179.3 06/20/2013   TRIG 73.0 03/31/2022   CHOLHDL 3 03/31/2022  Using medications without problems:none Muscle aches: none Diet compliance: moderate Exercise:  off and on walking, leg lifts and arm circles in AM. Other complaints:   MDD .  Chronic, stable control on Celexa 40 mg daily [place] Visit from 04/06/2022 in [place] at [facility] Total Score 0        Prediabetes, stable control with diet  Vit D def: well controlled.      Relevant past medical, surgical, family and social history reviewed and updated as indicated. Interim medical history since our last visit reviewed. Allergies and medications reviewed and  updated. Outpatient Medications Prior to Visit  Medication Sig Dispense Refill   acetaminophen (TYLENOL) 325 MG tablet Take 650 mg by mouth every 6 (six) hours as needed.     citalopram (CELEXA) 40 MG tablet TAKE 1 TABLET BY MOUTH EVERY DAY 90 tablet 1   diclofenac Sodium (VOLTAREN) 1 % GEL      nystatin cream (MYCOSTATIN)      pantoprazole (PROTONIX) 40 MG tablet Take 40 mg by mouth daily.  11   rosuvastatin (CRESTOR) 5 MG tablet TAKE 1 TABLET BY MOUTH EVERY DAY 90 tablet 0   ipratropium (ATROVENT) 0.06 % nasal spray Place 2 sprays into both nostrils 4 (four) times daily. 15 mL 12   spironolactone (ALDACTONE) 25 MG tablet Take 12.5 mg by mouth daily.     sulfamethoxazole-trimethoprim (BACTRIM DS) 800-160 MG tablet Take 1 tablet by mouth 2 (two) times daily. 6 tablet 0   No facility-administered medications prior to visit.     Per HPI unless specifically indicated in ROS section below Review of Systems  Constitutional:  Negative for fatigue and fever.  HENT:  Negative for congestion.   Eyes:  Negative for pain.  Respiratory:  Negative for cough and shortness of breath.   Cardiovascular:  Negative for chest pain, palpitations and leg swelling.  Gastrointestinal:  Negative for abdominal  pain.  Genitourinary:  Negative for dysuria and vaginal bleeding.  Musculoskeletal:  Negative for back pain.  Neurological:  Negative for syncope, light-headedness and headaches.  Psychiatric/Behavioral:  Negative for dysphoric mood.    Objective:  BP (!) 140/90   Pulse (!) 54   Temp 98.7 F (37.1 C) (Oral)   Ht '5\' 3"'$  (1.6 m)   Wt 184 lb 4 oz (83.6 kg)   SpO2 98%   BMI 32.64 kg/m   Wt Readings from Last 3 Encounters:  04/06/22 184 lb 4 oz (83.6 kg)  01/19/22 186 lb 4 oz (84.5 kg)  01/09/22 177 lb 3.2 oz (80.4 kg)      Physical Exam Vitals and nursing note reviewed.  Constitutional:      General: She is not in acute distress.    Appearance: Normal appearance. She is well-developed. She is  not ill-appearing or toxic-appearing.  HENT:     Head: Normocephalic.     Right Ear: Hearing, tympanic membrane, ear canal and external ear normal.     Left Ear: Hearing, tympanic membrane, ear canal and external ear normal.     Nose: Nose normal.  Eyes:     General: Lids are normal. Lids are everted, no foreign bodies appreciated.     Conjunctiva/sclera: Conjunctivae normal.     Pupils: Pupils are equal, round, and reactive to light.  Neck:     Thyroid: No thyroid mass or thyromegaly.     Vascular: No carotid bruit.     Trachea: Trachea normal.  Cardiovascular:     Rate and Rhythm: Normal rate and regular rhythm.     Heart sounds: Normal heart sounds, S1 normal and S2 normal. No murmur heard.    No gallop.  Pulmonary:     Effort: Pulmonary effort is normal. No respiratory distress.     Breath sounds: Normal breath sounds. No wheezing, rhonchi or rales.  Abdominal:     General: Bowel sounds are normal. There is no distension or abdominal bruit.     Palpations: Abdomen is soft. There is no fluid wave or mass.     Tenderness: There is no abdominal tenderness. There is no guarding or rebound.     Hernia: No hernia is present.  Musculoskeletal:     Cervical back: Normal range of motion and neck supple.  Lymphadenopathy:     Cervical: No cervical adenopathy.  Skin:    General: Skin is warm and dry.     Findings: No rash.  Neurological:     Mental Status: She is alert.     Cranial Nerves: No cranial nerve deficit.     Sensory: No sensory deficit.  Psychiatric:        Mood and Affect: Mood is not anxious or depressed.        Speech: Speech normal.        Behavior: Behavior normal. Behavior is cooperative.        Judgment: Judgment normal.       Results for orders placed or performed in visit on 03/31/22  VITAMIN D 25 Hydroxy (Vit-D Deficiency, Fractures)  Result Value Ref Range   VITD 31.76 30.00 - 100.00 ng/mL  Comprehensive metabolic panel  Result Value Ref Range    Sodium 141 135 - 145 mEq/L   Potassium 5.3 No hemolysis seen (H) 3.5 - 5.1 mEq/L   Chloride 105 96 - 112 mEq/L   CO2 30 19 - 32 mEq/L   Glucose, Bld 126 (H) 70 - 99 mg/dL  BUN 14 6 - 23 mg/dL   Creatinine, Ser 1.20 0.40 - 1.20 mg/dL   Total Bilirubin 0.6 0.2 - 1.2 mg/dL   Alkaline Phosphatase 81 39 - 117 U/L   AST 16 0 - 37 U/L   ALT 11 0 - 35 U/L   Total Protein 6.3 6.0 - 8.3 g/dL   Albumin 4.1 3.5 - 5.2 g/dL   GFR 45.24 (L) >60.00 mL/min   Calcium 9.8 8.4 - 10.5 mg/dL  Lipid panel  Result Value Ref Range   Cholesterol 181 0 - 200 mg/dL   Triglycerides 73.0 0.0 - 149.0 mg/dL   HDL 56.60 >39.00 mg/dL   VLDL 14.6 0.0 - 40.0 mg/dL   LDL Cholesterol 109 (H) 0 - 99 mg/dL   Total CHOL/HDL Ratio 3    NonHDL 123.96   Hemoglobin A1c  Result Value Ref Range   Hgb A1c MFr Bld 6.1 4.6 - 6.5 %     COVID 19 screen:  No recent travel or known exposure to COVID19 The patient denies respiratory symptoms of COVID 19 at this time. The importance of social distancing was discussed today.   Assessment and Plan The patient's preventative maintenance and recommended screening tests for an annual wellness exam were reviewed in full today. Brought up to date unless services declined.  Counselled on the importance of diet, exercise, and its role in overall health and mortality. The patient's FH and SH was reviewed, including their home life, tobacco status, and drug and alcohol status.   Vaccines:uptodate, except for Shingrix. Pap/DVE:  nml pap, neg HPV 2015, no further indicated,  Continue DVE 1-2 years. Mammo 12/20/2021 Bone Density osteopenia 12/20/2021 repeat in 5 years  colon: 01/09/2022, repeat in 5 years per Dr.  Allen Norris Smoking Status: nonsmoker  Hep C: done    Problem List Items Addressed This Visit     Chronic kidney disease (CKD) stage G3b/A1, moderately decreased glomerular filtration rate (GFR) between 30-44 mL/min/1.73 square meter and albuminuria creatinine ratio less than 30  mg/g (HCC)     Chronic, stable, slightly improved control.      HYPERCHOLESTEROLEMIA    Chronic, slightly worsening control despite Crestor 10 mg p.o. daily.  She will get back on track with healthy lifestyle changes and we will reevaluate in 3 months.      Hyperkalemia   Relevant Orders   Basic Metabolic Panel   Major depressive disorder, recurrent episode, moderate (HCC)    Stable, chronic.  Continue current medication.  Celexa 40 mg p.o. daily      PREDIABETES    Chronic, worsening control now in borderline diabetes range.  She will work on low carbohydrate diet, increased exercise and weight loss.      Vitamin D deficiency    Resolved with supplementation      Other Visit Diagnoses     Routine general medical examination at a health care facility    -  Primary   Need for influenza vaccination       Relevant Orders   Flu Vaccine QUAD High Dose(Fluad) (Completed)        Eliezer Lofts, MD

## 2022-04-06 NOTE — Assessment & Plan Note (Signed)
Chronic, stable, slightly improved control.

## 2022-04-06 NOTE — Assessment & Plan Note (Signed)
Chronic, worsening control now in borderline diabetes range.  She will work on low carbohydrate diet, increased exercise and weight loss.

## 2022-04-07 LAB — BASIC METABOLIC PANEL
BUN: 9 mg/dL (ref 6–23)
CO2: 31 mEq/L (ref 19–32)
Calcium: 10.1 mg/dL (ref 8.4–10.5)
Chloride: 104 mEq/L (ref 96–112)
Creatinine, Ser: 1.23 mg/dL — ABNORMAL HIGH (ref 0.40–1.20)
GFR: 43.91 mL/min — ABNORMAL LOW (ref >60.00)
Glucose, Bld: 71 mg/dL (ref 70–99)
Potassium: 4.8 mEq/L (ref 3.5–5.1)
Sodium: 141 mEq/L (ref 135–145)

## 2022-05-18 ENCOUNTER — Telehealth: Payer: Self-pay | Admitting: Family Medicine

## 2022-05-18 ENCOUNTER — Ambulatory Visit: Payer: Self-pay

## 2022-05-18 ENCOUNTER — Other Ambulatory Visit: Payer: Self-pay

## 2022-05-18 ENCOUNTER — Encounter: Payer: Self-pay | Admitting: Emergency Medicine

## 2022-05-18 ENCOUNTER — Emergency Department
Admission: EM | Admit: 2022-05-18 | Discharge: 2022-05-18 | Disposition: A | Discharge status: Home / Self Care | Payer: PPO | Attending: Emergency Medicine | Admitting: Emergency Medicine

## 2022-05-18 ENCOUNTER — Emergency Department: Payer: PPO

## 2022-05-18 DIAGNOSIS — R42 Dizziness and giddiness: Secondary | ICD-10-CM | POA: Diagnosis not present

## 2022-05-18 DIAGNOSIS — R519 Headache, unspecified: Secondary | ICD-10-CM | POA: Diagnosis not present

## 2022-05-18 DIAGNOSIS — N189 Chronic kidney disease, unspecified: Secondary | ICD-10-CM | POA: Diagnosis not present

## 2022-05-18 DIAGNOSIS — J45909 Unspecified asthma, uncomplicated: Secondary | ICD-10-CM | POA: Insufficient documentation

## 2022-05-18 LAB — BASIC METABOLIC PANEL
Anion gap: 7 (ref 5–15)
BUN: 17 mg/dL (ref 8–23)
CO2: 23 mmol/L (ref 22–32)
Calcium: 9.3 mg/dL (ref 8.9–10.3)
Chloride: 104 mmol/L (ref 98–111)
Creatinine, Ser: 1.11 mg/dL — ABNORMAL HIGH (ref 0.44–1.00)
GFR, Estimated: 53 mL/min — ABNORMAL LOW (ref >60)
Glucose, Bld: 81 mg/dL (ref 70–99)
Potassium: 4.2 mmol/L (ref 3.5–5.1)
Sodium: 134 mmol/L — ABNORMAL LOW (ref 135–145)

## 2022-05-18 LAB — URINALYSIS, ROUTINE W REFLEX MICROSCOPIC
Bilirubin Urine: NEGATIVE
Glucose, UA: NEGATIVE mg/dL
Ketones, ur: NEGATIVE mg/dL
Nitrite: NEGATIVE
Protein, ur: NEGATIVE mg/dL
Specific Gravity, Urine: 1.017 (ref 1.005–1.030)
pH: 5 (ref 5.0–8.0)

## 2022-05-18 LAB — CBC
HCT: 42 % (ref 36.0–46.0)
Hemoglobin: 13.9 g/dL (ref 12.0–15.0)
MCH: 28.8 pg (ref 26.0–34.0)
MCHC: 33.1 g/dL (ref 30.0–36.0)
MCV: 87 fL (ref 80.0–100.0)
Platelets: 258 10*3/uL (ref 150–400)
RBC: 4.83 MIL/uL (ref 3.87–5.11)
RDW: 12 % (ref 11.5–15.5)
WBC: 7.8 10*3/uL (ref 4.0–10.5)
nRBC: 0 % (ref 0.0–0.2)

## 2022-05-18 NOTE — ED Triage Notes (Signed)
Patient to ED via [facility] for dizziness. Patient states symptoms has been ongoing for the past week. Worse in the morning when first getting up.

## 2022-05-18 NOTE — ED Provider Notes (Addendum)
[facility] Provider Note    Event Date/Time   First MD Initiated Contact with Patient 05/18/22 1731     (approximate)   History   Chief Complaint: Dizziness   HPI  Rebekah Johnston is a 72 y.o. female with a history of asthma, GERD, CKD who comes the ED complaining of dizziness for the past week, worse when first waking up in the morning, not positional or related to movement.  Improves throughout the day when being up and about and continuing her usual activities.  No vision changes, no headaches, no paresthesias or motor weakness.  No falls or head trauma, no neck pain or stiffness or fever.     Physical Exam   Triage Vital Signs: ED Triage Vitals  Enc Vitals Group     BP 05/18/22 1654 (!) 144/97     Pulse Rate 05/18/22 1654 64     Resp 05/18/22 1654 18     Temp 05/18/22 1654 97.8 F (36.6 C)     Temp Source 05/18/22 1654 Oral     SpO2 05/18/22 1654 96 %     Weight 05/18/22 1655 180 lb (81.6 kg)     Height 05/18/22 1655 '5\' 3"'$  (1.6 m)     Head Circumference --      Peak Flow --      Pain Score 05/18/22 1655 0     Pain Loc --      Pain Edu? --      Excl. in [place]? --     Most recent vital signs: Vitals:   05/18/22 1900 05/18/22 2025  BP: (!) 138/92 (!) 142/82  Pulse: 66 (!) 57  Resp: 16 16  Temp: 97.9 F (36.6 C) 97.9 F (36.6 C)  SpO2: 98% 100%    General: Awake, no distress.  CV:  Good peripheral perfusion.  Resp:  Normal effort.  Abd:  No distention.  Other:  Cranial nerves III through XII intact.  Motor and cerebellar function normal.   ED Results / Procedures / Treatments   Labs (all labs ordered are listed, but only abnormal results are displayed) Labs Reviewed  BASIC METABOLIC PANEL - Abnormal; Notable for the following components:      Result Value   Sodium 134 (*)    Creatinine, Ser 1.11 (*)    GFR, Estimated 53 (*)    All other components within normal limits  URINALYSIS, ROUTINE W REFLEX MICROSCOPIC - Abnormal;  Notable for the following components:   Color, Urine YELLOW (*)    APPearance HAZY (*)    Hgb urine dipstick SMALL (*)    Leukocytes,Ua MODERATE (*)    Bacteria, UA RARE (*)    All other components within normal limits  CBC  CBG MONITORING, ED     EKG Interpreted by me Sinus rhythm rate of 59.  Normal axis intervals QRS ST segments and T waves.   RADIOLOGY CT head interpreted by me, appears normal.  Radiology report reviewed.   PROCEDURES:  Procedures   MEDICATIONS ORDERED IN ED: Medications - No data to display   IMPRESSION / MDM / [place] / ED COURSE  I reviewed the triage vital signs and the nursing notes.                              Differential diagnosis includes, but is not limited to, electrolyte abnormality, UTI, dehydration, anemia, intracranial mass  Patient's presentation  is most consistent with acute presentation with potential threat to life or bodily function.  Patient presents with vague dizziness symptoms worse when first waking up.  Neurologically intact.  Vital signs unremarkable.  Mental status normal.  Workup with labs and CT head are negative.  Stable for discharge home to follow-up with PCP.   Considering the patient's symptoms, medical history, and physical examination today, I have low suspicion for ischemic stroke, intracranial hemorrhage, meningitis, encephalitis, carotid or vertebral dissection, venous sinus thrombosis, MS, intracranial hypertension, glaucoma, CRAO, CRVO, or temporal arteritis.        FINAL CLINICAL IMPRESSION(S) / ED DIAGNOSES   Final diagnoses:  Dizziness     Rx / DC Orders   ED Discharge Orders     None        Note:  This document was prepared using Dragon voice recognition software and may include unintentional dictation errors.   Carrie Mew, MD 05/19/22 [place], [place], MD 05/19/22 [phone]

## 2022-05-18 NOTE — Discharge Instructions (Signed)
Your lab tests and CT scan of the head today were all okay.  Please follow up with your doctor for continued evaluation of your symptoms.

## 2022-05-18 NOTE — Telephone Encounter (Signed)
[facility] - Client TELEPHONE ADVICE RECORD [facility] Patient Name: Rebekah Johnston Gender: Female DOB: [birth year] Age: 72 Y 54 M 3 D Return Phone Number: [phone] (Primary) Address: City/ State/ Zip: [place]  [address] Client [facility] - Client Client Site [place] - Day Provider Eliezer Lofts - MD Contact Type Call Who Is Calling Patient / Member / Family / Caregiver Call Type Triage / Clinical Relationship To Patient Self Return Phone Number [phone] (Primary) Chief Complaint Dizziness Reason for Call Symptomatic / Request for Health Information Initial Comment Caller was transferred from the office. Patient is experiencing dizziness and when she gets up she feels like she will fall. Additional Comment Patient disconnected the call from the office, I tried calling the patient back multiple times and no luck. Patient's info has not been vertified yet. Translation No Disp. Time [name] Time) Disposition Final User 05/18/2022 1:05:00 PM Attempt made - message left [name] 05/18/2022 1:26:31 PM FINAL ATTEMPT MADE - message left [name] RN, Levada Dy Final Disposition 05/18/2022 1:26:31 PM FINAL ATTEMPT MADE - message left [name], RN, Glenard Haring

## 2022-05-18 NOTE — Telephone Encounter (Signed)
Patient called in stating she is experiencing dizziness. She stated that she is feeling lightheaded, feel like she is going to fall if she get up to fast, and the room is spinning at times. Sent over to access.

## 2022-05-18 NOTE — Telephone Encounter (Signed)
Noted  

## 2022-05-18 NOTE — Telephone Encounter (Signed)
I spoke with pt; pt said no dizziness right now. Pt said has had dizziness on and off; head feels full but pt said more like head feels with head cold. Pt had S/T 2 days ago but no S/T today. Pt does not have SOB but on 05/17/22 pt had lt sided CP that was dull and did not radiate into neck or arm and only lasted few mins. Pt did have rt arm pain that radiated into neck and some weakness in rt arm due to what pt thinks could be tendonitis due to how pt uses rt arm at work; rt arm issue has been going on for a while.no other extremity weakness.pt has slight H/A. Pt does not have way to ck BP.  No appts this afternoon at [facility] or [place] . Pt is going to get her daughter to take pt to [place] appt 05/18/22 at 7pm. Pt will be at [facility] at 6:45 for ck in. UC & ED precautions given and pt voiced understanding. Sending note to Dr Diona Browner and [place] pool.

## 2022-05-18 NOTE — Telephone Encounter (Signed)
Pt called stating she went to the UC & was told she should visit the ER instead. Pt states she on the way there. Call back # [phone]

## 2022-06-16 ENCOUNTER — Ambulatory Visit: Payer: Managed Care, Other (non HMO) | Admitting: Cardiovascular Disease

## 2022-06-19 ENCOUNTER — Other Ambulatory Visit: Payer: Self-pay | Admitting: Family Medicine

## 2022-06-19 DIAGNOSIS — R0602 Shortness of breath: Secondary | ICD-10-CM

## 2022-06-19 NOTE — Telephone Encounter (Signed)
Last office visit 04/06/22 for CPE.  Last refilled . Crestor 5 mg 12/23/21 for #90 with no refills.  Refill is requesting 5 mg but per last office note:   Chronic, slightly worsening control despite Crestor 10 mg p.o. daily.  Please advise.

## 2022-06-20 NOTE — Telephone Encounter (Signed)
Spoke with Ms. Rebekah Johnston.  She states she does not recall being advised to increase her Crestor to 10 mg at her last office visit but she is agreeable to doing that.   Go ahead and send in Rx for 10 mg daily?

## 2022-06-20 NOTE — Telephone Encounter (Signed)
Left message for Rebekah Johnston to return my call in regards to medication refill.

## 2022-07-20 ENCOUNTER — Encounter: Payer: Self-pay | Admitting: Family Medicine

## 2022-07-20 ENCOUNTER — Ambulatory Visit (INDEPENDENT_AMBULATORY_CARE_PROVIDER_SITE_OTHER): Payer: PPO | Admitting: Family Medicine

## 2022-07-20 VITALS — BP 132/84 | HR 68 | Temp 97.4°F | Ht 63.0 in | Wt 185.2 lb

## 2022-07-20 DIAGNOSIS — F331 Major depressive disorder, recurrent, moderate: Secondary | ICD-10-CM | POA: Diagnosis not present

## 2022-07-20 DIAGNOSIS — M545 Low back pain, unspecified: Secondary | ICD-10-CM | POA: Diagnosis not present

## 2022-07-20 MED ORDER — BUPROPION HCL ER (XL) 150 MG PO TB24: 150.0000 mg | ORAL_TABLET | Freq: Every day | ORAL | 3 refills | Status: DC

## 2022-07-20 NOTE — Telephone Encounter (Signed)
Returned Rebekah Johnston's call.  She was tearful on the phone and states she is having some depression and wanted to see if Dr. Diona Browner would change her medication.  Office visit scheduled today with Dr. Diona Browner at 3:00 pm.

## 2022-07-20 NOTE — Assessment & Plan Note (Addendum)
Chronic poor control on Celexa 40 mg daily.  Add Wellbutrin XR as adjunct in AM  Referral to counselor.  Close follow-up in 4 weeks.

## 2022-07-20 NOTE — Patient Instructions (Signed)
Start wellbutrin in addition to celexa 40 mg daily. Call to set up counseling referral.

## 2022-07-20 NOTE — Telephone Encounter (Signed)
Pt called in requesting a call back regarding he medication # [phone]

## 2022-07-20 NOTE — Progress Notes (Signed)
Patient ID: Rebekah Johnston, female    DOB: May 02, 1950, 72 y.o.   MRN: [id number]  This visit was conducted in person.  BP 132/84   Pulse 68   Temp (!) 97.4 F (36.3 C) (Temporal)   Ht 5\' 3"  (1.6 m)   Wt 185 lb 4 oz (84 kg)   SpO2 98%   BMI 32.82 kg/m    CC:  Chief Complaint  Patient presents with   Medical Management of Chronic Issues    Here for depression f/u.    Subjective:   HPI: Rebekah Johnston is a 72 y.o. female presenting on 07/20/2022 for Medical Management of Chronic Issues (Here for depression f/u.)   MDD: Previously stable per reviewed office visit note from April 06, 2022 on Celexa 40 mg daily.  She has been feeling significantly worse today.... but  has been more of an issues in last  month She has been feeling less motivation, feeling sad and tearful.  Lots of stress at work.   Has financial issues from overspending in the past... has to work. HAving trouble forgiving herself fopr past overspending.   Son controls her money to help her.   Trying to be more active but overwhelmed.    PHQ9: 4  GAD 7 3  Also in passing she mentions that her sciatica has been flaring up more often lately.  She has been using Tylenol for pain.  If this does not seem to improve with Tylenol, home physical therapy heat and time, at the next appointment in 4 weeks we can consider neuropathic pain medicine versus change to Cymbalta versus tramadol etc. for pain.  Relevant past medical, surgical, family and social history reviewed and updated as indicated. Interim medical history since our last visit reviewed. Allergies and medications reviewed and updated. Outpatient Medications Prior to Visit  Medication Sig Dispense Refill   acetaminophen (TYLENOL) 325 MG tablet Take 650 mg by mouth every 6 (six) hours as needed.     citalopram (CELEXA) 40 MG tablet TAKE 1 TABLET BY MOUTH EVERY DAY 90 tablet 1   diclofenac Sodium (VOLTAREN) 1 % GEL      nystatin cream (MYCOSTATIN)       pantoprazole (PROTONIX) 40 MG tablet Take 40 mg by mouth daily.  11   rosuvastatin (CRESTOR) 5 MG tablet TAKE 1 TABLET BY MOUTH EVERY DAY 90 tablet 0   No facility-administered medications prior to visit.     Per HPI unless specifically indicated in ROS section below Review of Systems  Constitutional:  Negative for fatigue and fever.  HENT:  Negative for congestion.   Eyes:  Negative for pain.  Respiratory:  Negative for cough and shortness of breath.   Cardiovascular:  Negative for chest pain, palpitations and leg swelling.  Gastrointestinal:  Negative for abdominal pain.  Genitourinary:  Negative for dysuria and vaginal bleeding.  Musculoskeletal:  Negative for back pain.  Neurological:  Negative for syncope, light-headedness and headaches.  Psychiatric/Behavioral:  Negative for dysphoric mood.    Objective:  BP 132/84   Pulse 68   Temp (!) 97.4 F (36.3 C) (Temporal)   Ht 5\' 3"  (1.6 m)   Wt 185 lb 4 oz (84 kg)   SpO2 98%   BMI 32.82 kg/m   Wt Readings from Last 3 Encounters:  07/20/22 185 lb 4 oz (84 kg)  05/18/22 180 lb (81.6 kg)  04/06/22 184 lb 4 oz (83.6 kg)      Physical Exam Constitutional:  General: She is not in acute distress.    Appearance: Normal appearance. She is well-developed. She is not ill-appearing or toxic-appearing.  HENT:     Head: Normocephalic.     Right Ear: Hearing, tympanic membrane, ear canal and external ear normal. Tympanic membrane is not erythematous, retracted or bulging.     Left Ear: Hearing, tympanic membrane, ear canal and external ear normal. Tympanic membrane is not erythematous, retracted or bulging.     Nose: No mucosal edema or rhinorrhea.     Right Sinus: No maxillary sinus tenderness or frontal sinus tenderness.     Left Sinus: No maxillary sinus tenderness or frontal sinus tenderness.     Mouth/Throat:     Pharynx: Uvula midline.  Eyes:     General: Lids are normal. Lids are everted, no foreign bodies appreciated.      Conjunctiva/sclera: Conjunctivae normal.     Pupils: Pupils are equal, round, and reactive to light.  Neck:     Thyroid: No thyroid mass or thyromegaly.     Vascular: No carotid bruit.     Trachea: Trachea normal.  Cardiovascular:     Rate and Rhythm: Normal rate and regular rhythm.     Pulses: Normal pulses.     Heart sounds: Normal heart sounds, S1 normal and S2 normal. No murmur heard.    No friction rub. No gallop.  Pulmonary:     Effort: Pulmonary effort is normal. No tachypnea or respiratory distress.     Breath sounds: Normal breath sounds. No decreased breath sounds, wheezing, rhonchi or rales.  Abdominal:     General: Bowel sounds are normal.     Palpations: Abdomen is soft.     Tenderness: There is no abdominal tenderness.  Musculoskeletal:     Cervical back: Normal range of motion and neck supple.  Skin:    General: Skin is warm and dry.     Findings: No rash.  Neurological:     Mental Status: She is alert.  Psychiatric:        Mood and Affect: Mood is not anxious or depressed.        Speech: Speech normal.        Behavior: Behavior normal. Behavior is cooperative.        Thought Content: Thought content normal.        Judgment: Judgment normal.       Results for orders placed or performed during the hospital encounter of [id number]  Basic metabolic panel  Result Value Ref Range   Sodium 134 (L) 135 - 145 mmol/L   Potassium 4.2 3.5 - 5.1 mmol/L   Chloride 104 98 - 111 mmol/L   CO2 23 22 - 32 mmol/L   Glucose, Bld 81 70 - 99 mg/dL   BUN 17 8 - 23 mg/dL   Creatinine, Ser 1.11 (H) 0.44 - 1.00 mg/dL   Calcium 9.3 8.9 - 10.3 mg/dL   GFR, Estimated 53 (L) >60 mL/min   Anion gap 7 5 - 15  CBC  Result Value Ref Range   WBC 7.8 4.0 - 10.5 K/uL   RBC 4.83 3.87 - 5.11 MIL/uL   Hemoglobin 13.9 12.0 - 15.0 g/dL   HCT 42.0 36.0 - 46.0 %   MCV 87.0 80.0 - 100.0 fL   MCH 28.8 26.0 - 34.0 pg   MCHC 33.1 30.0 - 36.0 g/dL   RDW 12.0 11.5 - 15.5 %   Platelets 258  150 - 400 K/uL   nRBC  0.0 0.0 - 0.2 %  Urinalysis, Routine w reflex microscopic  Result Value Ref Range   Color, Urine YELLOW (A) YELLOW   APPearance HAZY (A) CLEAR   Specific Gravity, Urine 1.017 1.005 - 1.030   pH 5.0 5.0 - 8.0   Glucose, UA NEGATIVE NEGATIVE mg/dL   Hgb urine dipstick SMALL (A) NEGATIVE   Bilirubin Urine NEGATIVE NEGATIVE   Ketones, ur NEGATIVE NEGATIVE mg/dL   Protein, ur NEGATIVE NEGATIVE mg/dL   Nitrite NEGATIVE NEGATIVE   Leukocytes,Ua MODERATE (A) NEGATIVE   RBC / HPF 0-5 0 - 5 RBC/hpf   WBC, UA 11-20 0 - 5 WBC/hpf   Bacteria, UA RARE (A) NONE SEEN   Squamous Epithelial / HPF 0-5 0 - 5 /HPF   Mucus PRESENT     Assessment and Plan  Major depressive disorder, recurrent episode, moderate (HCC) Assessment & Plan:  Chronic poor control on Celexa 40 mg daily.  Add Wellbutrin XR as adjunct in AM  Referral to counselor.  Close follow-up in 4 weeks.  Orders: -     Ambulatory referral to Psychology  Acute bilateral low back pain without sciatica Assessment & Plan: Chronic, Also in passing she mentions that her sciatica has been flaring up more often lately.  She has been using Tylenol for pain.  If this does not seem to improve with Tylenol, home physical therapy heat and time, at the next appointment in 4 weeks we can consider neuropathic pain medicine versus change to Cymbalta versus tramadol etc. for pain.   Other orders -     buPROPion HCl ER (XL); Take 1 tablet (150 mg total) by mouth daily.  Dispense: 30 tablet; Refill: 3    Return in about 4 weeks (around 08/17/2022) for follow up mood.   Eliezer Lofts, MD

## 2022-07-20 NOTE — Assessment & Plan Note (Signed)
Chronic, Also in passing she mentions that her sciatica has been flaring up more often lately.  She has been using Tylenol for pain.  If this does not seem to improve with Tylenol, home physical therapy heat and time, at the next appointment in 4 weeks we can consider neuropathic pain medicine versus change to Cymbalta versus tramadol etc. for pain.

## 2022-08-01 ENCOUNTER — Other Ambulatory Visit: Payer: Self-pay | Admitting: Family Medicine

## 2022-08-11 ENCOUNTER — Other Ambulatory Visit: Payer: Self-pay | Admitting: Family Medicine

## 2022-08-17 ENCOUNTER — Encounter: Payer: Self-pay | Admitting: Family Medicine

## 2022-08-17 ENCOUNTER — Ambulatory Visit (INDEPENDENT_AMBULATORY_CARE_PROVIDER_SITE_OTHER): Payer: PPO | Admitting: Family Medicine

## 2022-08-17 VITALS — BP 136/72 | HR 58 | Temp 97.4°F | Ht 63.0 in | Wt 183.4 lb

## 2022-08-17 DIAGNOSIS — F331 Major depressive disorder, recurrent, moderate: Secondary | ICD-10-CM

## 2022-08-17 DIAGNOSIS — M546 Pain in thoracic spine: Secondary | ICD-10-CM | POA: Diagnosis not present

## 2022-08-17 MED ORDER — BUPROPION HCL ER (XL) 150 MG PO TB24: 150.0000 mg | ORAL_TABLET | Freq: Every day | ORAL | 1 refills | Status: DC

## 2022-08-17 NOTE — Assessment & Plan Note (Signed)
Acute, improved  She states that she is feeling much better and rarely has an issue with this at this time.  Rarely using  acetaminophen.

## 2022-08-17 NOTE — Assessment & Plan Note (Signed)
Chronic, significant improvement with addition of bupropion 150 mg XL adjunct to Celexa 40 mg p.o. daily.  She has also had significant improvement in sleep with correction of timing of medication she takes Celexa at bedtime and bupropion in the morning  .  We will continue her on this regimen.  She has upcoming appointment with counselor.  She has significant support from her family and feels significantly less stressed now that she has quit her job. We will reevaluate at her upcoming physical in approximately 9 months but she will let me know if she has any issues sooner.

## 2022-08-17 NOTE — Progress Notes (Signed)
Patient ID: Rebekah Johnston, female    DOB: Jan 08, 1951, 72 y.o.   MRN: [id number]  This visit was conducted in person.  BP 136/72   Pulse (!) 58   Temp (!) 97.4 F (36.3 C) (Temporal)   Ht  (1.6 m)   Wt 183 lb 6 oz (83.2 kg)   SpO2 97%   BMI 32.48 kg/m    CC:  Chief Complaint  Patient presents with   Medical Management of Chronic Issues    Here for 4 wk mood f/u.    Subjective:   HPI: Rebekah Johnston is a 72 y.o. female presenting on 08/17/2022 for Medical Management of Chronic Issues (Here for 4 wk mood f/u.)  MDD: At last office visit poor control ol on Celexa 40 mg daily.   Wellbutrin XR added as adjunct in AM.  Referral to counselor.  Today she reports improvement in symptoms. She is feeling much better.. taking bupropion in AM and celexa now at night... less sciatic nerve pain.  Celexa at night now helping her sleep better.  Increased motivation, less tearful. Less overwhelmed.  Put notice in at work.  Has appt 5/13.   No SE of bupropion.     08/17/2022    3:06 PM 07/20/2022    3:26 PM 04/06/2022    2:28 PM  Depression screen PHQ 2/9  Decreased Interest 1 1 0  Down, Depressed, Hopeless 0 1 0  PHQ - 2 Score 1 2 0  Altered sleeping 0 1 0  Tired, decreased energy 0 0 2  Change in appetite 0 1 0  Feeling bad or failure about yourself  0 1 0  Trouble concentrating 0 0 0  Moving slowly or fidgety/restless 0 0 0  Suicidal thoughts 0 0 0  PHQ-9 Score Difficult doing work/chores Not difficult at all Not difficult at all Not difficult at all       08/17/2022    3:06 PM 07/20/2022    3:27 PM  GAD 7 : Generalized Anxiety Score  Nervous, Anxious, on Edge 0 0  Control/stop worrying 1 1  Worry too much - different things 0 1  Trouble relaxing 0 0  Restless 0 0  Easily annoyed or irritable 1 1  Afraid - awful might happen 0 0  Total GAD 7 Score 2 3  Anxiety Difficulty Not difficult at all Not difficult at all             Relevant past  medical, surgical, family and social history reviewed and updated as indicated. Interim medical history since our last visit reviewed. Allergies and medications reviewed and updated. Outpatient Medications Prior to Visit  Medication Sig Dispense Refill   acetaminophen (TYLENOL) 325 MG tablet Take 650 mg by mouth every 6 (six) hours as needed.     citalopram (CELEXA) 40 MG tablet TAKE 1 TABLET BY MOUTH EVERY DAY 90 tablet 1   diclofenac Sodium (VOLTAREN) 1 % GEL      nystatin cream (MYCOSTATIN)      pantoprazole (PROTONIX) 40 MG tablet Take 40 mg by mouth daily.  11   rosuvastatin (CRESTOR) 5 MG tablet TAKE 1 TABLET BY MOUTH EVERY DAY 90 tablet 0   buPROPion (WELLBUTRIN XL) 150 MG 24 hr tablet TAKE 1 TABLET BY MOUTH EVERY DAY 90 tablet 0   No facility-administered medications prior to visit.     Per HPI unless specifically indicated in ROS section below Review of Systems  Constitutional:  Negative for fatigue and fever.  HENT:  Negative for ear pain.   Eyes:  Negative for pain.  Respiratory:  Negative for chest tightness and shortness of breath.   Cardiovascular:  Negative for chest pain, palpitations and leg swelling.  Gastrointestinal:  Negative for abdominal pain.  Genitourinary:  Negative for dysuria.   Objective:  BP 136/72   Pulse (!) 58   Temp (!) 97.4 F (36.3 C) (Temporal)   Ht  (1.6 m)   Wt 183 lb 6 oz (83.2 kg)   SpO2 97%   BMI 32.48 kg/m   Wt Readings from Last 3 Encounters:  08/17/22 183 lb 6 oz (83.2 kg)  07/20/22 185 lb 4 oz (84 kg)  05/18/22 180 lb (81.6 kg)      Physical Exam Constitutional:      General: She is not in acute distress.    Appearance: Normal appearance. She is well-developed. She is not ill-appearing or toxic-appearing.  HENT:     Head: Normocephalic.     Right Ear: Hearing, tympanic membrane, ear canal and external ear normal. Tympanic membrane is not erythematous, retracted or bulging.     Left Ear: Hearing, tympanic membrane, ear  canal and external ear normal. Tympanic membrane is not erythematous, retracted or bulging.     Nose: No mucosal edema or rhinorrhea.     Right Sinus: No maxillary sinus tenderness or frontal sinus tenderness.     Left Sinus: No maxillary sinus tenderness or frontal sinus tenderness.     Mouth/Throat:     Pharynx: Uvula midline.  Eyes:     General: Lids are normal. Lids are everted, no foreign bodies appreciated.     Conjunctiva/sclera: Conjunctivae normal.     Pupils: Pupils are equal, round, and reactive to light.  Neck:     Thyroid: No thyroid mass or thyromegaly.     Vascular: No carotid bruit.     Trachea: Trachea normal.  Cardiovascular:     Rate and Rhythm: Normal rate and regular rhythm.     Pulses: Normal pulses.     Heart sounds: Normal heart sounds, S1 normal and S2 normal. No murmur heard.    No friction rub. No gallop.  Pulmonary:     Effort: Pulmonary effort is normal. No tachypnea or respiratory distress.     Breath sounds: Normal breath sounds. No decreased breath sounds, wheezing, rhonchi or rales.  Abdominal:     General: Bowel sounds are normal.     Palpations: Abdomen is soft.     Tenderness: There is no abdominal tenderness.  Musculoskeletal:     Cervical back: Normal range of motion and neck supple.  Skin:    General: Skin is warm and dry.     Findings: No rash.  Neurological:     Mental Status: She is alert.  Psychiatric:        Mood and Affect: Mood is not anxious or depressed.        Speech: Speech normal.        Behavior: Behavior normal. Behavior is cooperative.        Thought Content: Thought content normal.        Judgment: Judgment normal.       Results for orders placed or performed during the hospital encounter of 05/18/22  Basic metabolic panel  Result Value Ref Range   Sodium 134 (L) 135 - 145 mmol/L   Potassium 4.2 3.5 - 5.1 mmol/L   Chloride 104 98 -  111 mmol/L   CO2 23 22 - 32 mmol/L   Glucose, Bld 81 70 - 99 mg/dL   BUN 17 8 - 23  mg/dL   Creatinine, Ser 1.61 (H) 0.44 - 1.00 mg/dL   Calcium 9.3 8.9 - 09.6 mg/dL   GFR, Estimated 53 (L) >60 mL/min   Anion gap 7 5 - 15  CBC  Result Value Ref Range   WBC 7.8 4.0 - 10.5 K/uL   RBC 4.83 3.87 - 5.11 MIL/uL   Hemoglobin 13.9 12.0 - 15.0 g/dL   HCT 04.5 40.9 - 81.1 %   MCV 87.0 80.0 - 100.0 fL   MCH 28.8 26.0 - 34.0 pg   MCHC 33.1 30.0 - 36.0 g/dL   RDW 91.4 78.2 - 95.6 %   Platelets 258 150 - 400 K/uL   nRBC 0.0 0.0 - 0.2 %  Urinalysis, Routine w reflex microscopic  Result Value Ref Range   Color, Urine YELLOW (A) YELLOW   APPearance HAZY (A) CLEAR   Specific Gravity, Urine 1.017 1.005 - 1.030   pH 5.0 5.0 - 8.0   Glucose, UA NEGATIVE NEGATIVE mg/dL   Hgb urine dipstick SMALL (A) NEGATIVE   Bilirubin Urine NEGATIVE NEGATIVE   Ketones, ur NEGATIVE NEGATIVE mg/dL   Protein, ur NEGATIVE NEGATIVE mg/dL   Nitrite NEGATIVE NEGATIVE   Leukocytes,Ua MODERATE (A) NEGATIVE   RBC / HPF 0-5 0 - 5 RBC/hpf   WBC, UA 11-20 0 - 5 WBC/hpf   Bacteria, UA RARE (A) NONE SEEN   Squamous Epithelial / HPF 0-5 0 - 5 /HPF   Mucus PRESENT     Assessment and Plan  Major depressive disorder, recurrent episode, moderate Assessment & Plan: Chronic, significant improvement with addition of bupropion 150 mg XL adjunct to Celexa 40 mg p.o. daily.  She has also had significant improvement in sleep with correction of timing of medication she takes Celexa at bedtime and bupropion in the morning  .  We will continue her on this regimen.  She has upcoming appointment with counselor.  She has significant support from her family and feels significantly less stressed now that she has quit her job. We will reevaluate at her upcoming physical in approximately 9 months but she will let me know if she has any issues sooner.   Acute midline thoracic back pain Assessment & Plan: Acute, improved  She states that she is feeling much better and rarely has an issue with this at this time.  Rarely  using  acetaminophen.   Other orders -     buPROPion HCl ER (XL); Take 1 tablet (150 mg total) by mouth daily.  Dispense: 90 tablet; Refill: 1    Return in about 8 months (around 04/18/2023) for phone AMW,  fasting labs then CPE with me.   Kerby Nora, MD

## 2022-08-21 ENCOUNTER — Other Ambulatory Visit: Payer: Self-pay | Admitting: Family Medicine

## 2022-08-21 DIAGNOSIS — R0602 Shortness of breath: Secondary | ICD-10-CM

## 2022-08-21 NOTE — Telephone Encounter (Signed)
At visit on 04/06/22 AVS states:  Return for labs only visit for cholesterol recheck in 3 months  But AVS on 08/17/2022 states: Return in about 8 months (around 04/18/2023) for phone AMW, fasting labs then CPE with me. Does patient need to recheck Lipid prior to refill or okay to refill?  Please advise.

## 2022-09-03 ENCOUNTER — Other Ambulatory Visit: Payer: Self-pay | Admitting: Cardiovascular Disease

## 2022-09-03 DIAGNOSIS — R0789 Other chest pain: Secondary | ICD-10-CM

## 2022-09-12 ENCOUNTER — Ambulatory Visit: Payer: PPO | Admitting: Clinical

## 2022-10-23 ENCOUNTER — Other Ambulatory Visit: Payer: Self-pay | Admitting: Cardiovascular Disease

## 2022-10-23 ENCOUNTER — Other Ambulatory Visit: Payer: Self-pay | Admitting: Family Medicine

## 2022-10-23 ENCOUNTER — Ambulatory Visit
Admission: EM | Admit: 2022-10-23 | Discharge: 2022-10-23 | Disposition: A | Discharge status: Home / Self Care | Payer: PPO | Attending: Emergency Medicine | Admitting: Emergency Medicine

## 2022-10-23 DIAGNOSIS — J45990 Exercise induced bronchospasm: Secondary | ICD-10-CM | POA: Insufficient documentation

## 2022-10-23 DIAGNOSIS — E7800 Pure hypercholesterolemia, unspecified: Secondary | ICD-10-CM | POA: Diagnosis not present

## 2022-10-23 DIAGNOSIS — R7303 Prediabetes: Secondary | ICD-10-CM | POA: Insufficient documentation

## 2022-10-23 DIAGNOSIS — Z1152 Encounter for screening for COVID-19: Secondary | ICD-10-CM | POA: Diagnosis not present

## 2022-10-23 DIAGNOSIS — B9789 Other viral agents as the cause of diseases classified elsewhere: Secondary | ICD-10-CM | POA: Diagnosis not present

## 2022-10-23 DIAGNOSIS — Z20822 Contact with and (suspected) exposure to covid-19: Secondary | ICD-10-CM | POA: Insufficient documentation

## 2022-10-23 DIAGNOSIS — R0602 Shortness of breath: Secondary | ICD-10-CM

## 2022-10-23 DIAGNOSIS — J069 Acute upper respiratory infection, unspecified: Secondary | ICD-10-CM | POA: Diagnosis not present

## 2022-10-23 DIAGNOSIS — N189 Chronic kidney disease, unspecified: Secondary | ICD-10-CM | POA: Insufficient documentation

## 2022-10-23 DIAGNOSIS — R0789 Other chest pain: Secondary | ICD-10-CM

## 2022-10-23 DIAGNOSIS — H9209 Otalgia, unspecified ear: Secondary | ICD-10-CM | POA: Insufficient documentation

## 2022-10-23 LAB — SARS CORONAVIRUS 2 BY RT PCR: SARS Coronavirus 2 by RT PCR: NEGATIVE

## 2022-10-23 MED ORDER — PROMETHAZINE-DM 6.25-15 MG/5ML PO SYRP: 5.0000 mL | ORAL_SOLUTION | Freq: Four times a day (QID) | ORAL | 0 refills | Status: DC | PRN

## 2022-10-23 MED ORDER — BENZONATATE 100 MG PO CAPS: 200.0000 mg | ORAL_CAPSULE | Freq: Three times a day (TID) | ORAL | 0 refills | Status: DC

## 2022-10-23 MED ORDER — IPRATROPIUM BROMIDE 0.06 % NA SOLN: 2.0000 | Freq: Four times a day (QID) | NASAL | 12 refills | Status: DC

## 2022-10-23 NOTE — ED Provider Notes (Signed)
[facility]    CSN: [id number] Arrival date & time: 10/23/22  1021      History   Chief Complaint Chief Complaint  Patient presents with   Fever   Generalized Body Aches    HPI Rebekah Johnston is a 72 y.o. female.   HPI  72 year old female with a past medical history significant for prediabetes, exercise-induced asthma, chronic kidney disease, and hypercholesterolemia presents for evaluation of fever with a Tmax of 100.3, nasal congestion, ear pain, nonproductive cough, and bodyaches for the last 2 days.  She reports that last week she was exposed to COVID at a church dinner.  She has taken 2 old home COVID tests that were both negative.  She denies any shortness of breath or wheezing.  Patient's last measured fever was last night.  Past Medical History:  Diagnosis Date   Asthma    no inhalers   Bronchitis    taking meds now   Chronic kidney disease 2008   Stage G3b/A1   Depression    GERD (gastroesophageal reflux disease)    Glaucoma    no eye drops   Hyperlipidemia    no meds   Macular degeneration    Bil   Osteoarthritis    Osteopenia    Sleep apnea    No CPAP    Patient Active Problem List   Diagnosis Date Noted   History of colonic polyps    Polyp of colon    Lumbar back pain with radiculopathy affecting left lower extremity 10/11/2021   Acute midline thoracic back pain 04/13/2020   ETD (Eustachian tube dysfunction), left 07/09/2018   Hyperkalemia 05/10/2017   Vitamin D deficiency 05/10/2017   Medicare annual wellness visit, subsequent 04/17/2017   Trochanteric bursitis of left hip 03/09/2017   Osteopenia 05/16/2016   Major depressive disorder, recurrent episode, moderate (HCC) 04/04/2016   Gastroesophageal reflux disease 04/04/2016   Low back pain 01/18/2016   Bilateral chronic knee pain 01/18/2016   Counseling regarding end of life decision making 03/18/2015   Chronic kidney disease (CKD) stage G3b/A1, moderately decreased glomerular  filtration rate (GFR) between 30-44 mL/min/1.73 square meter and albuminuria creatinine ratio less than 30 mg/g (HCC) 08/20/2008   HYPERCHOLESTEROLEMIA 05/20/2008   ALLERGIC RHINITIS 04/16/2008   EXERCISE INDUCED ASTHMA 04/16/2008   OSTEOARTHRITIS 04/16/2008   PREDIABETES 04/16/2008   UMBILICAL HERNIORRHAPHY, HX OF 04/16/2008    Past Surgical History:  Procedure Laterality Date   BUNIONECTOMY     rt. foot   CHOLECYSTECTOMY     COLONOSCOPY WITH PROPOFOL N/A 01/09/2022   Procedure: COLONOSCOPY POLYPECTOMY WITH PROPOFOL CLIP APPLIED AT CECAL POLYP SITE;  Surgeon: Midge Minium, MD;  Location: [facility];  Service: Endoscopy;  Laterality: N/A;  sleep apnea   HERNIA REPAIR     left inguinal hernia   TONSILLECTOMY  1967    OB History   No obstetric history on file.      Home Medications    Prior to Admission medications   Medication Sig Start Date End Date Taking? Authorizing Provider  acetaminophen (TYLENOL) 325 MG tablet Take 650 mg by mouth every 6 (six) hours as needed.   Yes [provider]  benzonatate (TESSALON) 100 MG capsule Take 2 capsules (200 mg total) by mouth every 8 (eight) hours. 10/23/22  Yes Becky Augusta, NP  buPROPion (WELLBUTRIN XL) 150 MG 24 hr tablet Take 1 tablet (150 mg total) by mouth daily. 08/17/22  Yes Bedsole, Amy E, MD  citalopram (CELEXA)  40 MG tablet TAKE 1 TABLET BY MOUTH EVERY DAY 08/01/22  Yes Bedsole, Amy E, MD  diclofenac Sodium (VOLTAREN) 1 % GEL    Yes [provider]  ipratropium (ATROVENT) 0.06 % nasal spray Place 2 sprays into both nostrils 4 (four) times daily. 10/23/22  Yes Becky Augusta, NP  nystatin cream (MYCOSTATIN)    Yes [provider]  pantoprazole (PROTONIX) 40 MG tablet TAKE 1 TABLET BY MOUTH EVERY DAY IN THE MORNING 09/04/22  Yes Laurier Nancy, MD  promethazine-dextromethorphan (PROMETHAZINE-DM) 6.25-15 MG/5ML syrup Take 5 mLs by mouth 4 (four) times daily as needed. 10/23/22  Yes Becky Augusta, NP   rosuvastatin (CRESTOR) 5 MG tablet TAKE 1 TABLET BY MOUTH EVERY DAY 08/22/22  Yes Bedsole, Amy E, MD    Family History Family History  Problem Relation Age of Onset   Prostate cancer Father    Dementia Mother    Diabetes Brother    Melanoma Other        Uncle   Colon cancer Neg Hx    Esophageal cancer Neg Hx    Rectal cancer Neg Hx    Stomach cancer Neg Hx     Social History Social History   Tobacco Use   Smoking status: Former    Packs/day: 0.25    Years: 3.00    Additional pack years: 0.00    Total pack years: 0.75    Types: Cigarettes    Quit date: 05/23/2001    Years since quitting: 21.4   Smokeless tobacco: Never   Tobacco comments:    smoked socially not everyday thing.  Vaping Use   Vaping Use: Never used  Substance Use Topics   Alcohol use: Yes    Alcohol/week: 1.0 standard drink of alcohol    Types: 1 Glasses of wine per week    Comment: occasional once a month   Drug use: No     Allergies   Patient has no known allergies.   Review of Systems Review of Systems  Constitutional:  Positive for fever.  HENT:  Positive for congestion, ear pain and sinus pressure. Negative for rhinorrhea and sore throat.   Respiratory:  Positive for cough. Negative for shortness of breath and wheezing.      Physical Exam Triage Vital Signs ED Triage Vitals  Enc Vitals Group     BP      Pulse      Resp      Temp      Temp src      SpO2      Weight      Height      Head Circumference      Peak Flow      Pain Score      Pain Loc      Pain Edu?      Excl. in GC?    No data found.  Updated Vital Signs BP 104/78 (BP Location: Left Arm)   Pulse 70   Temp 98.2 F (36.8 C) (Oral)   Resp 16   Ht 5\' 3"  (1.6 m)   Wt 180 lb (81.6 kg)   SpO2 95%   BMI 31.89 kg/m   Visual Acuity Right Eye Distance:   Left Eye Distance:   Bilateral Distance:    Right Eye Near:   Left Eye Near:    Bilateral Near:     Physical Exam Vitals and nursing note reviewed.   Constitutional:      Appearance: Normal appearance.  She is not ill-appearing.  HENT:     Head: Normocephalic and atraumatic.     Right Ear: Tympanic membrane, ear canal and external ear normal. There is no impacted cerumen.     Left Ear: Tympanic membrane, ear canal and external ear normal. There is no impacted cerumen.     Nose: Congestion and rhinorrhea present.     Comments: His mucosa is erythematous and mildly edematous with scant clear discharge in both nares.    Mouth/Throat:     Mouth: Mucous membranes are moist.     Pharynx: Oropharynx is clear. No posterior oropharyngeal erythema.  Cardiovascular:     Rate and Rhythm: Normal rate and regular rhythm.     Pulses: Normal pulses.     Heart sounds: Normal heart sounds. No murmur heard.    No friction rub. No gallop.  Pulmonary:     Effort: Pulmonary effort is normal.     Breath sounds: Normal breath sounds. No wheezing, rhonchi or rales.  Musculoskeletal:     Cervical back: Normal range of motion and neck supple.  Lymphadenopathy:     Cervical: No cervical adenopathy.  Skin:    General: Skin is warm and dry.     Capillary Refill: Capillary refill takes less than 2 seconds.     Findings: No erythema or rash.  Neurological:     General: No focal deficit present.     Mental Status: She is alert and oriented to person, place, and time.      UC Treatments / Results  Labs (all labs ordered are listed, but only abnormal results are displayed) Labs Reviewed  SARS CORONAVIRUS 2 BY RT PCR    EKG   Radiology No results found.  Procedures Procedures (including critical care time)  Medications Ordered in UC Medications - No data to display  Initial Impression / Assessment and Plan / UC Course  I have reviewed the triage vital signs and the nursing notes.  Pertinent labs & imaging results that were available during my care of the patient were reviewed by me and considered in my medical decision making (see chart for  details).   The patient is a pleasant, nontoxic-appearing 72 year old female presenting for evaluation of COVID-like symptoms as outlined in HPI above.  She did take 2 home COVID test that were both negative.  She does report that they were both negative.  She was exposed to COVID last week at a church dinner.  On exam she does have inflamed nasal mucosa but the rest of her upper and lower respiratory exam are benign.  I will order a COVID PCR here in clinic.  She is interested in the antiviral therapy if she is COVID-positive.  Patient has a CMP in epic from January of this year showing a GFR of 53.  COVID PCR is negative.  I will discharge patient on the diagnosis of viral URI with cough and treat her symptoms with Atrovent nasal spray, Tessalon Perles, Promethazine DM cough syrup.  Return precautions reviewed.   Final Clinical Impressions(s) / UC Diagnoses   Final diagnoses:  Viral URI with cough     Discharge Instructions      Your COVID test is negative.  I do believe you have a viral respiratory infection.  Please use over-the-counter Tylenol and/or ibuprofen according to pack instructions as needed for pain.  Use the Atrovent nasal spray, 2 squirts in each nostril every 6 hours, as needed for runny nose and postnasal drip.  Use  the [employer] every 8 hours during the day.  Take them with a small sip of water.  They may give you some numbness to the base of your tongue or a metallic taste in your mouth, this is normal.  Use the Promethazine DM cough syrup at bedtime for cough and congestion.  It will make you drowsy so do not take it during the day.  Return for reevaluation or see your primary care provider for any new or worsening symptoms.      ED Prescriptions     Medication Sig Dispense Auth. Provider   benzonatate (TESSALON) 100 MG capsule Take 2 capsules (200 mg total) by mouth every 8 (eight) hours. 21 capsule Becky Augusta, NP   ipratropium (ATROVENT) 0.06 %  nasal spray Place 2 sprays into both nostrils 4 (four) times daily. 15 mL Becky Augusta, NP   promethazine-dextromethorphan (PROMETHAZINE-DM) 6.25-15 MG/5ML syrup Take 5 mLs by mouth 4 (four) times daily as needed. 118 mL Becky Augusta, NP      PDMP not reviewed this encounter.   Becky Augusta, NP 10/23/22 1220

## 2022-10-23 NOTE — ED Triage Notes (Signed)
Pt c/o fever & bodyaches x3 days. Tmax 100.3. Has tried tylenol w/o relief. Took 2 covid tests at home yesterday which were both neg.

## 2022-10-23 NOTE — Discharge Instructions (Addendum)
Your COVID test is negative.  I do believe you have a viral respiratory infection.  Please use over-the-counter Tylenol and/or ibuprofen according to pack instructions as needed for pain.  Use the Atrovent nasal spray, 2 squirts in each nostril every 6 hours, as needed for runny nose and postnasal drip.  Use the Tessalon Perles every 8 hours during the day.  Take them with a small sip of water.  They may give you some numbness to the base of your tongue or a metallic taste in your mouth, this is normal.  Use the Promethazine DM cough syrup at bedtime for cough and congestion.  It will make you drowsy so do not take it during the day.  Return for reevaluation or see your primary care provider for any new or worsening symptoms.

## 2022-11-04 ENCOUNTER — Ambulatory Visit
Admission: EM | Admit: 2022-11-04 | Discharge: 2022-11-04 | Disposition: A | Discharge status: Home / Self Care | Payer: PPO | Attending: Internal Medicine | Admitting: Internal Medicine

## 2022-11-04 DIAGNOSIS — M5442 Lumbago with sciatica, left side: Secondary | ICD-10-CM

## 2022-11-04 DIAGNOSIS — G8929 Other chronic pain: Secondary | ICD-10-CM

## 2022-11-04 MED ORDER — METHOCARBAMOL 500 MG PO TABS: 500.0000 mg | ORAL_TABLET | Freq: Every evening | ORAL | 0 refills | Status: DC | PRN

## 2022-11-04 MED ORDER — PREDNISONE 10 MG PO TABS: ORAL_TABLET | ORAL | 0 refills | Status: DC

## 2022-11-04 NOTE — ED Triage Notes (Signed)
Left side sciatica pain X1 week. Patient has gotten a new job and has been doing some increased lifting but not too much. No falls or known injuries.

## 2022-11-04 NOTE — ED Provider Notes (Signed)
[facility]    CSN: [id number] Arrival date & time: 11/04/22  1021      History   Chief Complaint Chief Complaint  Patient presents with   Sciatica    HPI Rebekah Johnston is a 72 y.o. female with a history of chronic low back pain with left-sided sciatica presents to urgent care today with complaint of worsening left-sided low back pain.  She reports this started 1 week ago.  She describes the pain as sore and achy at times but can also be sharp and stabbing.  The pain worsens with sitting or standing for long periods of time and also laying on her left side.  The pain radiates into her left lower extremity.  She reports associated numbness and tingling but denies weakness.  She denies loss of bowel or bladder control.  She denies any specific injury to the area but did start a new job where she has been doing some lifting.  She has tried Tylenol OTC with minimal relief of symptoms.  HPI  Past Medical History:  Diagnosis Date   Asthma    no inhalers   Bronchitis    taking meds now   Chronic kidney disease 2008   Stage G3b/A1   Depression    GERD (gastroesophageal reflux disease)    Glaucoma    no eye drops   Hyperlipidemia    no meds   Macular degeneration    Bil   Osteoarthritis    Osteopenia    Sleep apnea    No CPAP    Patient Active Problem List   Diagnosis Date Noted   History of colonic polyps    Polyp of colon    Lumbar back pain with radiculopathy affecting left lower extremity 10/11/2021   Acute midline thoracic back pain 04/13/2020   ETD (Eustachian tube dysfunction), left 07/09/2018   Hyperkalemia 05/10/2017   Vitamin D deficiency 05/10/2017   Medicare annual wellness visit, subsequent 04/17/2017   Trochanteric bursitis of left hip 03/09/2017   Osteopenia 05/16/2016   Major depressive disorder, recurrent episode, moderate (HCC) 04/04/2016   Gastroesophageal reflux disease 04/04/2016   Low back pain 01/18/2016   Bilateral chronic knee  pain 01/18/2016   Counseling regarding end of life decision making 03/18/2015   Chronic kidney disease (CKD) stage G3b/A1, moderately decreased glomerular filtration rate (GFR) between 30-44 mL/min/1.73 square meter and albuminuria creatinine ratio less than 30 mg/g (HCC) 08/20/2008   HYPERCHOLESTEROLEMIA 05/20/2008   ALLERGIC RHINITIS 04/16/2008   EXERCISE INDUCED ASTHMA 04/16/2008   OSTEOARTHRITIS 04/16/2008   PREDIABETES 04/16/2008   UMBILICAL HERNIORRHAPHY, HX OF 04/16/2008    Past Surgical History:  Procedure Laterality Date   BUNIONECTOMY     rt. foot   CHOLECYSTECTOMY     COLONOSCOPY WITH PROPOFOL N/A 01/09/2022   Procedure: COLONOSCOPY POLYPECTOMY WITH PROPOFOL CLIP APPLIED AT CECAL POLYP SITE;  Surgeon: Midge Minium, MD;  Location: [facility];  Service: Endoscopy;  Laterality: N/A;  sleep apnea   HERNIA REPAIR     left inguinal hernia   TONSILLECTOMY  1967    OB History   No obstetric history on file.      Home Medications    Prior to Admission medications   Medication Sig Start Date End Date Taking? Authorizing Provider  acetaminophen (TYLENOL) 325 MG tablet Take 650 mg by mouth every 6 (six) hours as needed.   Yes [provider]  buPROPion (WELLBUTRIN XL) 150 MG 24 hr tablet Take 1 tablet (150  mg total) by mouth daily. 08/17/22  Yes Bedsole, Amy E, MD  citalopram (CELEXA) 40 MG tablet TAKE 1 TABLET BY MOUTH EVERY DAY 08/01/22  Yes Bedsole, Amy E, MD  diclofenac Sodium (VOLTAREN) 1 % GEL    Yes [provider]  ipratropium (ATROVENT) 0.06 % nasal spray Place 2 sprays into both nostrils 4 (four) times daily. 10/23/22  Yes Becky Augusta, NP  methocarbamol (ROBAXIN) 500 MG tablet Take 1 tablet (500 mg total) by mouth at bedtime as needed for muscle spasms. 11/04/22  Yes Dotty Gonzalo, Salvadore Oxford, NP  nystatin cream (MYCOSTATIN)    Yes [provider]  pantoprazole (PROTONIX) 40 MG tablet TAKE 1 TABLET BY MOUTH EVERY DAY IN THE MORNING 10/23/22  Yes  Laurier Nancy, MD  predniSONE (DELTASONE) 10 MG tablet Take 3 tabs on days 1-3, 2 tabs on days 4-6, 1 tab on days 7-9 11/04/22  Yes Jisel Fleet, Salvadore Oxford, NP  rosuvastatin (CRESTOR) 5 MG tablet TAKE 1 TABLET BY MOUTH EVERY DAY 10/23/22  Yes Bedsole, Amy E, MD  benzonatate (TESSALON) 100 MG capsule Take 2 capsules (200 mg total) by mouth every 8 (eight) hours. 10/23/22   Becky Augusta, NP  promethazine-dextromethorphan (PROMETHAZINE-DM) 6.25-15 MG/5ML syrup Take 5 mLs by mouth 4 (four) times daily as needed. 10/23/22   Becky Augusta, NP    Family History Family History  Problem Relation Age of Onset   Prostate cancer Father    Dementia Mother    Diabetes Brother    Melanoma Other        Uncle   Colon cancer Neg Hx    Esophageal cancer Neg Hx    Rectal cancer Neg Hx    Stomach cancer Neg Hx     Social History Social History   Tobacco Use   Smoking status: Former    Packs/day: 0.25    Years: 3.00    Additional pack years: 0.00    Total pack years: 0.75    Types: Cigarettes    Quit date: 05/23/2001    Years since quitting: 21.4   Smokeless tobacco: Never   Tobacco comments:    smoked socially not everyday thing.  Vaping Use   Vaping Use: Never used  Substance Use Topics   Alcohol use: Yes    Alcohol/week: 1.0 standard drink of alcohol    Types: 1 Glasses of wine per week    Comment: occasional once a month   Drug use: No     Allergies   Patient has no known allergies.   Review of Systems Review of Systems  Respiratory:  Negative for cough, chest tightness and shortness of breath.   Cardiovascular:  Negative for chest pain.  Gastrointestinal:        Negative for loss of bowel control  Genitourinary:        Negative for loss of bladder control  Musculoskeletal:  Positive for back pain.  Neurological:  Positive for numbness. Negative for weakness.       Numbness of left lower extremity     Physical Exam Triage Vital Signs ED Triage Vitals  Enc Vitals Group     BP  11/04/22 1034 117/81     Pulse Rate 11/04/22 1034 64     Resp 11/04/22 1034 16     Temp 11/04/22 1034 98.5 F (36.9 C)     Temp Source 11/04/22 1034 Oral     SpO2 11/04/22 1034 97 %     Weight 11/04/22 1033 180 lb (81.6  kg)     Height 11/04/22 1033 5\' 3"  (1.6 m)     Head Circumference --      Peak Flow --      Pain Score 11/04/22 1031 8     Pain Loc --      Pain Edu? --      Excl. in GC? --    No data found.  Updated Vital Signs BP 117/81 (BP Location: Right Arm)   Pulse 64   Temp 98.5 F (36.9 C) (Oral)   Resp 16   Ht 5\' 3"  (1.6 m)   Wt 180 lb (81.6 kg)   SpO2 97%   BMI 31.89 kg/m       Physical Exam Constitutional:      Appearance: She is obese.     Comments: Appears uncomfortable  Cardiovascular:     Rate and Rhythm: Normal rate and regular rhythm.     Heart sounds: Normal heart sounds.  Pulmonary:     Effort: Pulmonary effort is normal.     Breath sounds: Normal breath sounds.  Musculoskeletal:        General: Tenderness present.     Left lower leg: No edema.     Comments: Normal flexion, rotation and lateral bending of the spine.  Decreased extension secondary to pain.  Pain with palpation over the lumbar spine and left SI joint.  Strength 5/5 BUE.  Able to stand on tiptoes and heels.  Limping gait without use of device.  Neurological:     Mental Status: She is alert and oriented to person, place, and time.     Sensory: No sensory deficit.     Motor: No weakness.     Gait: Gait abnormal.     Comments: Positive SLR on the left at 30 degrees      UC Treatments / Results  Labs   EKG   Radiology     Medications Ordered in UC Medications - No data to display  Initial Impression / Assessment and Plan / UC Course  I have reviewed the triage vital signs and the nursing notes.  Pertinent labs & imaging results that were available during my care of the patient were reviewed by me and considered in my medical decision making (see chart for  details).     72 year old female with acute on chronic left-sided low back pain with left-sided sciatica.  No indication to repeat imaging at this time however discussed if symptoms persist or worsen, she should follow-up with her PCP and request possible MRI and referral to physical therapy.  Rx for Pred taper x 9 days for symptom management.  Rx for methocarbamol 500 mg nightly as needed-sedation caution given.  Encouraged her to apply heat.  Stretching exercises given.  Advised her to follow-up with her PCP for new or worsening symptoms.  Final Clinical Impressions(s) / UC Diagnoses   Final diagnoses:  Chronic left-sided low back pain with left-sided sciatica     Discharge Instructions      You were seen today for acute on chronic left-sided low back pain with sciatica.  There was no need to repeat imaging at this time.  I am putting you on steroids and muscle laxer's for symptom management.  Please be advised that the muscle relaxers can cause sedation.  Heat will be more helpful than ice in this setting.  If symptoms persist or worsen, follow-up with your PCP for possible MRI and referral to physical therapy.     ED  Prescriptions     Medication Sig Dispense Auth. Provider   predniSONE (DELTASONE) 10 MG tablet Take 3 tabs on days 1-3, 2 tabs on days 4-6, 1 tab on days 7-9 18 tablet Madyn Ivins, Salvadore Oxford, NP   methocarbamol (ROBAXIN) 500 MG tablet Take 1 tablet (500 mg total) by mouth at bedtime as needed for muscle spasms. 10 tablet Lorre Munroe, NP      PDMP not reviewed this encounter.   Lorre Munroe, NP 11/04/22 1051

## 2022-11-04 NOTE — Discharge Instructions (Addendum)
You were seen today for acute on chronic left-sided low back pain with sciatica.  There was no need to repeat imaging at this time.  I am putting you on steroids and muscle laxer's for symptom management.  Please be advised that the muscle relaxers can cause sedation.  Heat will be more helpful than ice in this setting.  If symptoms persist or worsen, follow-up with your PCP for possible MRI and referral to physical therapy.

## 2022-11-08 ENCOUNTER — Telehealth: Payer: Self-pay | Admitting: Family Medicine

## 2022-11-08 MED ORDER — TRAMADOL HCL 50 MG PO TABS: 50.0000 mg | ORAL_TABLET | Freq: Two times a day (BID) | ORAL | 0 refills | Status: AC | PRN

## 2022-11-08 NOTE — Telephone Encounter (Signed)
Patient called over and stated that she went to the urgent care for her sciatica. She stated that she was prescribed some predniSONE (DELTASONE) 10 MG tablet. She was wanting to know if she could take Toradol or Tramadol, and if so she was wanting to know if Dr. Ermalene Searing would send it in for her. Please advise. Thank you!

## 2022-11-08 NOTE — Telephone Encounter (Signed)
Call patient.  I reviewed her urgent care note in detail.  She cannot take Toradol along with the prednisone.  She should complete the prednisone taper and use methocarbamol as needed for muscle spasm.  I will send in a short course of tramadol to use for breakthrough pain if needed.  If her symptoms are not improving as expected she should follow-up with me.  PDMP reviewed during this encounter.

## 2022-11-08 NOTE — Telephone Encounter (Signed)
Rebekah Johnston notified as instructed by telephone.  Patient states understanding.

## 2022-11-08 NOTE — Addendum Note (Signed)
Addended byKerby Nora E on: 11/08/2022 01:02 PM   Modules accepted: Orders

## 2022-11-20 ENCOUNTER — Encounter: Payer: Self-pay | Admitting: Family Medicine

## 2022-11-20 ENCOUNTER — Ambulatory Visit (INDEPENDENT_AMBULATORY_CARE_PROVIDER_SITE_OTHER): Payer: PPO | Admitting: Family Medicine

## 2022-11-20 VITALS — BP 132/80 | HR 70 | Temp 97.4°F | Ht 63.0 in | Wt 178.4 lb

## 2022-11-20 DIAGNOSIS — U071 COVID-19: Secondary | ICD-10-CM | POA: Diagnosis not present

## 2022-11-20 DIAGNOSIS — R051 Acute cough: Secondary | ICD-10-CM

## 2022-11-20 DIAGNOSIS — M5416 Radiculopathy, lumbar region: Secondary | ICD-10-CM | POA: Diagnosis not present

## 2022-11-20 HISTORY — DX: COVID-19: U07.1

## 2022-11-20 LAB — POC COVID19 BINAXNOW: SARS Coronavirus 2 Ag: POSITIVE — AB

## 2022-11-20 MED ORDER — PREDNISONE 20 MG PO TABS: ORAL_TABLET | ORAL | 0 refills | Status: DC

## 2022-11-20 MED ORDER — CHERATUSSIN AC 100-10 MG/5ML PO SOLN: 5.0000 mL | Freq: Three times a day (TID) | ORAL | 0 refills | Status: DC | PRN

## 2022-11-20 NOTE — Assessment & Plan Note (Signed)
Acute lumbar radiculopathy that started prior to [place] trip but has persisted despite initial treatment by [facility] and PCP with prednisone, robaxin, tramadol. Exam consistent with ongoing L lumbar radiculopathy, no red flags.  Will Rx 40mg  prednisone taper, refer to PT (once improved from COVID standpoint).  Discussed red flags to seek further care immediately, or to return if no better after 4-6 wks for further evaluation including lumbar imaging.

## 2022-11-20 NOTE — Progress Notes (Signed)
Ph: [phone] Fax: [phone]   Patient ID: Rebekah Johnston, female    DOB: 1950-09-05, 72 y.o.   MRN: [id number]  This visit was conducted in person.  BP 132/80   Pulse 70   Temp (!) 97.4 F (36.3 C) (Temporal)   Ht 5\' 3"  (1.6 m)   Wt 178 lb 6 oz (80.9 kg)   SpO2 98%   BMI 31.60 kg/m    CC: discuss L sciatica Subjective:   HPI: Rebekah Johnston is a 72 y.o. female presenting on 11/20/2022 for Sciatica (C/o L side sciatica/hip pain. Started about 2 wks ago. Prescribed tramadol and tried Tylenol. H/o sciatica. ) and Cough (C/o cough, nasal congestion and fatigue. Sxs started 11/13/22 after returning from trip to [place]. )   3 wk h/o L sided sciatica. Seen at [facility], prescribed prednisone 30mg  taper and methocarbamol as well as PCP also prescribed tramadol. She felt tylenol was the most helpful, but also only limited relief. Symptoms started prior to upcoming trip. Acute pain to left lateral thigh when bending over. Also has significant lateral left hip pain with coughing. For the past 1 week having tingling/numbness of left leg from lateral thigh to toes.   No inciting trauma/injury or falls. No noted leg weakness.  No fevers/chills.  No bowel/bladder incontinence.  No saddle anesthesia.  No h/o cancer  Recent trip to [place], family gathering, subsequently developed cough, head > chest congestion, fatigue attributed to heat. Symptom started 11/13/2022 (7d ago). Mild ST.  Some diarrhea. Hasn't tried anything for this.  No fevers/chills, ear or tooth pain, chest or abd pain, nausea. No dyspnea or wheezing.   H/o activity induced asthma.  Non smoker.  No personal h/o diabetes. H/o prediabetes. Lab Results  Component Value Date   HGBA1C 6.1 03/31/2022       Relevant past medical, surgical, family and social history reviewed and updated as indicated. Interim medical history since our last visit reviewed. Allergies and medications reviewed and updated. Outpatient  Medications Prior to Visit  Medication Sig Dispense Refill   acetaminophen (TYLENOL) 325 MG tablet Take 650 mg by mouth every 6 (six) hours as needed.     buPROPion (WELLBUTRIN XL) 150 MG 24 hr tablet Take 1 tablet (150 mg total) by mouth daily. 90 tablet 1   citalopram (CELEXA) 40 MG tablet TAKE 1 TABLET BY MOUTH EVERY DAY 90 tablet 1   diclofenac Sodium (VOLTAREN) 1 % GEL      ipratropium (ATROVENT) 0.06 % nasal spray Place 2 sprays into both nostrils 4 (four) times daily. 15 mL 12   nystatin cream (MYCOSTATIN)      pantoprazole (PROTONIX) 40 MG tablet TAKE 1 TABLET BY MOUTH EVERY DAY IN THE MORNING 90 tablet 0   rosuvastatin (CRESTOR) 5 MG tablet TAKE 1 TABLET BY MOUTH EVERY DAY 90 tablet 1   benzonatate (TESSALON) 100 MG capsule Take 2 capsules (200 mg total) by mouth every 8 (eight) hours. 21 capsule 0   methocarbamol (ROBAXIN) 500 MG tablet Take 1 tablet (500 mg total) by mouth at bedtime as needed for muscle spasms. 10 tablet 0   predniSONE (DELTASONE) 10 MG tablet Take 3 tabs on days 1-3, 2 tabs on days 4-6, 1 tab on days 7-9 18 tablet 0   promethazine-dextromethorphan (PROMETHAZINE-DM) 6.25-15 MG/5ML syrup Take 5 mLs by mouth 4 (four) times daily as needed. 118 mL 0   No facility-administered medications prior to visit.     Per HPI unless specifically  indicated in ROS section below Review of Systems  Objective:  BP 132/80   Pulse 70   Temp (!) 97.4 F (36.3 C) (Temporal)   Ht 5\' 3"  (1.6 m)   Wt 178 lb 6 oz (80.9 kg)   SpO2 98%   BMI 31.60 kg/m   Wt Readings from Last 3 Encounters:  11/20/22 178 lb 6 oz (80.9 kg)  11/04/22 180 lb (81.6 kg)  10/23/22 180 lb (81.6 kg)      Physical Exam Vitals and nursing note reviewed.  Constitutional:      Appearance: Normal appearance. She is not ill-appearing.  HENT:     Head: Normocephalic and atraumatic.     Right Ear: Tympanic membrane, ear canal and external ear normal. There is no impacted cerumen.     Left Ear: Tympanic  membrane, ear canal and external ear normal. There is no impacted cerumen.     Mouth/Throat:     Comments: Wearing mask Eyes:     Extraocular Movements: Extraocular movements intact.     Conjunctiva/sclera: Conjunctivae normal.     Pupils: Pupils are equal, round, and reactive to light.  Cardiovascular:     Rate and Rhythm: Normal rate and regular rhythm.     Pulses: Normal pulses.     Heart sounds: Normal heart sounds. No murmur heard. Pulmonary:     Effort: Pulmonary effort is normal. No respiratory distress.     Breath sounds: Normal breath sounds. No wheezing, rhonchi or rales.  Musculoskeletal:        General: Tenderness present. Normal range of motion.     Right lower leg: No edema.     Left lower leg: No edema.     Comments:  No pain midline spine No significant paraspinous mm tenderness Neg SLR bilaterally. No pain with int/ext rotation at hip. No pain at SIJ, GTB bilaterally.  Discomfort to palpation of L sciatic notch  Lymphadenopathy:     Head:     Right side of head: No submental, submandibular, tonsillar, preauricular or posterior auricular adenopathy.     Left side of head: No submental, submandibular, tonsillar, preauricular or posterior auricular adenopathy.     Cervical: No cervical adenopathy.     Right cervical: No superficial cervical adenopathy.    Left cervical: No superficial cervical adenopathy.     Upper Body:     Right upper body: No supraclavicular adenopathy.     Left upper body: No supraclavicular adenopathy.  Skin:    General: Skin is warm and dry.     Findings: No rash.  Neurological:     Mental Status: She is alert.     Sensory: Sensation is intact.     Motor: Motor function is intact.     Coordination: Coordination is intact.     Deep Tendon Reflexes:     Reflex Scores:      Patellar reflexes are 2+ on the right side and 1+ on the left side.      Achilles reflexes are 1+ on the right side and 1+ on the left side.    Comments: 5/5  strength BLE  Psychiatric:        Mood and Affect: Mood normal.        Behavior: Behavior normal.       Results for orders placed or performed in visit on 11/20/22  POC COVID-19 BinaxNow  Result Value Ref Range   SARS Coronavirus 2 Ag Positive (A) Negative    Assessment & Plan:  Problem List Items Addressed This Visit     Lumbar back pain with radiculopathy affecting left lower extremity    Acute lumbar radiculopathy that started prior to [place] trip but has persisted despite initial treatment by [facility] and PCP with prednisone, robaxin, tramadol. Exam consistent with ongoing L lumbar radiculopathy, no red flags.  Will Rx 40mg  prednisone taper, refer to PT (once improved from COVID standpoint).  Discussed red flags to seek further care immediately, or to return if no better after 4-6 wks for further evaluation including lumbar imaging.      Relevant Medications   predniSONE (DELTASONE) 20 MG tablet   COVID-19 virus infection - Primary    Outside of window for antiviral treatment.  Reviewed expected course of illness, anticipated course of recovery, as well as red flags to suggest COVID pneumonia and/or to seek urgent in-person care. Reviewed CDC isolation/quarantine guidelines.  Encouraged fluids and rest. Reviewed further supportive care measures at home including vit C 500mg  bid, vit D 2000 IU daily, zinc 100mg  daily, tylenol PRN, pepcid 20mg  BID PRN.   Letter for work provided today, with return to work 10d after illness on 11/24/2022.       Other Visit Diagnoses     Acute cough       Relevant Orders   POC COVID-19 BinaxNow (Completed)        Meds ordered this encounter  Medications   predniSONE (DELTASONE) 20 MG tablet    Sig: Take two tablets daily for 3 days followed by one tablet daily for 4 days    Dispense:  10 tablet    Refill:  0   guaiFENesin-codeine (CHERATUSSIN AC) 100-10 MG/5ML syrup    Sig: Take 5 mLs by mouth 3 (three) times daily as needed for cough  (sedation precautions).    Dispense:  100 mL    Refill:  0    Orders Placed This Encounter  Procedures   POC COVID-19 BinaxNow    Order Specific Question:   Previously tested for COVID-19    Answer:   Yes    Order Specific Question:   Resident in a congregate (group) care setting    Answer:   No    Order Specific Question:   Employed in healthcare setting    Answer:   No    Order Specific Question:   Pregnant    Answer:   No    Patient Instructions  You do have COVID infection. We're outside of window for antiviral. Push fluids and rest. Try codeine cough syrup sent to pharmacy.  May take tylenol as needed, vitamin C, D, and zinc to boost immune system.  Recommend out of work through Friday unless you start feeling much better sooner, but recommend mask wearing through Friday when around others.  Let us know if fever >101, worsening productive cough or shortness of breath develops.  Letter for work provided today.   For left sciatica, take prednisone taper sent to pharmacy. Continue tylenol as needed.  Do exercises provided today.  I will refer you to physical therapy once you're feeling better from COVID.  If any worsening or ongoing symptoms past 4-6 weeks, return to see [place].   Follow up plan: Return if symptoms worsen or fail to improve.  Eustaquio Boyden, MD

## 2022-11-20 NOTE — Assessment & Plan Note (Addendum)
Outside of window for antiviral treatment.  Reviewed expected course of illness, anticipated course of recovery, as well as red flags to suggest COVID pneumonia and/or to seek urgent in-person care. Reviewed CDC isolation/quarantine guidelines.  Encouraged fluids and rest. Reviewed further supportive care measures at home including vit C 500mg  bid, vit D 2000 IU daily, zinc 100mg  daily, tylenol PRN, pepcid 20mg  BID PRN.   Letter for work provided today, with return to work 10d after illness on 11/24/2022.

## 2022-11-20 NOTE — Patient Instructions (Addendum)
You do have COVID infection. We're outside of window for antiviral. Push fluids and rest. Try codeine cough syrup sent to pharmacy.  May take tylenol as needed, vitamin C, D, and zinc to boost immune system.  Recommend out of work through Friday unless you start feeling much better sooner, but recommend mask wearing through Friday when around others.  Let us know if fever >101, worsening productive cough or shortness of breath develops.  Letter for work provided today.   For left sciatica, take prednisone taper sent to pharmacy. Continue tylenol as needed.  Do exercises provided today.  I will refer you to physical therapy once you're feeling better from COVID.  If any worsening or ongoing symptoms past 4-6 weeks, return to see [place].

## 2022-11-28 ENCOUNTER — Telehealth: Payer: Self-pay | Admitting: Family Medicine

## 2022-11-28 DIAGNOSIS — M5416 Radiculopathy, lumbar region: Secondary | ICD-10-CM

## 2022-11-28 NOTE — Telephone Encounter (Signed)
Pt called in stating she is still having pain & issues with her hip even after the rounds of predniSONE (DELTASONE) 20 MG tablet [[id number]] . Pt states she can barely lift items or reach to the floor without being in a lot of pain. Please advise. Call back # [phone]

## 2022-11-28 NOTE — Telephone Encounter (Addendum)
Plan was physical therapy after improved from COVID. Has she been able to schedule this?  Did she feel any better while on prednisone course? Does tramadol provide any relief?  Could offer stronger pain medicine. Could offer coming in for lower back xrays (ongoing for 4+ weeks now).  Any new leg weakness, groin numbness, bowel/bladder accidents?

## 2022-11-29 NOTE — Telephone Encounter (Signed)
Plan was physical therapy after improved from COVID. Has she been able to schedule this?  Did she feel any better while on prednisone course? Does tramadol provide any relief?  Could offer stronger pain medicine. Could offer coming in for lower back xrays (ongoing for 4+ weeks now).  Any new leg weakness, groin numbness, bowel/bladder accidents?

## 2022-11-29 NOTE — Telephone Encounter (Signed)
No answer. Vm box not set up. Need to relay Dr. Timoteo Expose message and get answers to his questions.  I'm also sending his message via pt's MyChart.

## 2022-11-30 NOTE — Telephone Encounter (Signed)
Pt called back returning Lisa's call. Pt stated she hasn't set up any PT, she thought our office would be scheduling that for her. Pt stated she has been taking the prednisone & tramadol but she still remains in pain all day. Pt states she'd rather have an xray done to try finding the reason behind her pain then a MRI, if necessary instead of trying stronger pain meds. Pt requested for a mychart message be sent to her with any other questions/concerns. Call back # [phone]

## 2022-11-30 NOTE — Telephone Encounter (Signed)
No answer. Vm box not set up. Need to relay Dr. Timoteo Expose message and get answers to his questions.  Also sent his message via pt's MyChart (see below).

## 2022-12-01 ENCOUNTER — Ambulatory Visit (INDEPENDENT_AMBULATORY_CARE_PROVIDER_SITE_OTHER)
Admission: RE | Admit: 2022-12-01 | Discharge: 2022-12-01 | Disposition: A | Discharge status: Home / Self Care | Payer: PPO | Source: Ambulatory Visit | Attending: Family Medicine | Admitting: Family Medicine

## 2022-12-01 DIAGNOSIS — M5416 Radiculopathy, lumbar region: Secondary | ICD-10-CM | POA: Diagnosis not present

## 2022-12-01 DIAGNOSIS — M419 Scoliosis, unspecified: Secondary | ICD-10-CM | POA: Diagnosis not present

## 2022-12-01 DIAGNOSIS — M545 Low back pain, unspecified: Secondary | ICD-10-CM | POA: Diagnosis not present

## 2022-12-01 DIAGNOSIS — M47816 Spondylosis without myelopathy or radiculopathy, lumbar region: Secondary | ICD-10-CM | POA: Diagnosis not present

## 2022-12-01 NOTE — Addendum Note (Signed)
Addended by: Eustaquio Boyden on: 12/01/2022 06:53 AM   Modules accepted: Orders

## 2022-12-01 NOTE — Telephone Encounter (Signed)
Lvm asking pt to call back. Need to relay Dr. Timoteo Expose message and schedule lab visit for back x-rays.

## 2022-12-01 NOTE — Telephone Encounter (Signed)
Patient returned call regarding back xrays,would like a return call.

## 2022-12-01 NOTE — Telephone Encounter (Signed)
Lumbar xrays ordered. Often insurance wants [place] to try PT prior to approving MRI.

## 2022-12-01 NOTE — Telephone Encounter (Signed)
Pt showed up at the office for x-rays.

## 2022-12-06 ENCOUNTER — Encounter: Payer: Self-pay | Admitting: *Deleted

## 2022-12-06 NOTE — Addendum Note (Signed)
Addended by: Eustaquio Boyden on: 12/06/2022 08:05 AM   Modules accepted: Orders

## 2022-12-06 NOTE — Telephone Encounter (Addendum)
Plz notify xray hasn't been read by radiology yet but there is evidence of decreased disc space at L4/5 worse on left which could be a sign of herniated disc, as well as scoliosis.  I've placed PT referral in [place].  Let us know if she develops any leg weakness, bowel/bladder accidents or worsening symptoms for MRI.  Did prednisone course help at all? Could repeat that if it did.

## 2022-12-06 NOTE — Telephone Encounter (Signed)
Spoke with pt relaying Dr. Timoteo Expose message. Pt verbalizes understanding. States prednisone helped by lessening the pain. But once done, pain returned. Declines another round at this time.

## 2022-12-21 DIAGNOSIS — D3131 Benign neoplasm of right choroid: Secondary | ICD-10-CM | POA: Diagnosis not present

## 2022-12-21 DIAGNOSIS — H2513 Age-related nuclear cataract, bilateral: Secondary | ICD-10-CM | POA: Diagnosis not present

## 2022-12-21 DIAGNOSIS — H538 Other visual disturbances: Secondary | ICD-10-CM | POA: Diagnosis not present

## 2022-12-21 DIAGNOSIS — H353131 Nonexudative age-related macular degeneration, bilateral, early dry stage: Secondary | ICD-10-CM | POA: Diagnosis not present

## 2022-12-26 DIAGNOSIS — H2512 Age-related nuclear cataract, left eye: Secondary | ICD-10-CM | POA: Diagnosis not present

## 2022-12-26 DIAGNOSIS — H2511 Age-related nuclear cataract, right eye: Secondary | ICD-10-CM | POA: Diagnosis not present

## 2022-12-27 ENCOUNTER — Encounter: Payer: Self-pay | Admitting: Ophthalmology

## 2022-12-27 NOTE — Anesthesia Preprocedure Evaluation (Addendum)
Anesthesia Evaluation  Patient identified by MRN, date of birth, ID band Patient awake    Reviewed: Allergy & Precautions, H&P , NPO status , Patient's Chart, lab work & pertinent test results  Airway Mallampati: II  TM Distance: >3 FB Neck ROM: Full    Dental no notable dental hx. (+) Poor Dentition   Pulmonary neg pulmonary ROS, asthma , sleep apnea , former smoker   Pulmonary exam normal breath sounds clear to auscultation       Cardiovascular negative cardio ROS Normal cardiovascular exam Rhythm:Regular Rate:Normal     Neuro/Psych  PSYCHIATRIC DISORDERS  Depression     Neuromuscular disease negative neurological ROS  negative psych ROS   GI/Hepatic negative GI ROS, Neg liver ROS,GERD  ,,  Endo/Other  negative endocrine ROS    Renal/GU Renal diseasenegative Renal ROS  negative genitourinary   Musculoskeletal negative musculoskeletal ROS (+) Arthritis ,    Abdominal   Peds negative pediatric ROS (+)  Hematology negative hematology ROS (+)   Anesthesia Other Findings Osteoarthritis Depression Asthma  Bronchitis GERD (gastroesophageal reflux disease) Glaucoma Macular degeneration  Chronic kidney disease Hyperlipidemia  Sleep apnea Osteopenia  COVID-19    Reproductive/Obstetrics negative OB ROS                              Anesthesia Physical Anesthesia Plan  ASA: 3  Anesthesia Plan: MAC   Post-op Pain Management:    Induction: Intravenous  PONV Risk Score and Plan:   Airway Management Planned: Natural Airway and Nasal Cannula  Additional Equipment:   Intra-op Plan:   Post-operative Plan:   Informed Consent: I have reviewed the patients History and Physical, chart, labs and discussed the procedure including the risks, benefits and alternatives for the proposed anesthesia with the patient or authorized representative who has indicated his/her understanding and  acceptance.     Dental Advisory Given  Plan Discussed with: Anesthesiologist, CRNA and Surgeon  Anesthesia Plan Comments: (Patient consented for risks of anesthesia including but not limited to:  - adverse reactions to medications - damage to eyes, teeth, lips or other oral mucosa - nerve damage due to positioning  - sore throat or hoarseness - Damage to heart, brain, nerves, lungs, other parts of body or loss of life  Patient voiced understanding.)         Anesthesia Quick Evaluation

## 2022-12-29 NOTE — Discharge Instructions (Signed)

## 2023-01-02 DIAGNOSIS — M5416 Radiculopathy, lumbar region: Secondary | ICD-10-CM | POA: Diagnosis not present

## 2023-01-03 ENCOUNTER — Ambulatory Visit
Admission: RE | Admit: 2023-01-03 | Discharge: 2023-01-03 | Disposition: A | Discharge status: Home / Self Care | Payer: PPO | Attending: Ophthalmology | Admitting: Ophthalmology

## 2023-01-03 ENCOUNTER — Ambulatory Visit: Payer: PPO | Admitting: Anesthesiology

## 2023-01-03 ENCOUNTER — Encounter
Admission: RE | Disposition: A | Discharge status: Home / Self Care | Payer: Self-pay | Source: Home / Self Care | Attending: Ophthalmology

## 2023-01-03 ENCOUNTER — Other Ambulatory Visit: Payer: Self-pay

## 2023-01-03 DIAGNOSIS — M8580 Other specified disorders of bone density and structure, unspecified site: Secondary | ICD-10-CM | POA: Insufficient documentation

## 2023-01-03 DIAGNOSIS — H409 Unspecified glaucoma: Secondary | ICD-10-CM | POA: Diagnosis not present

## 2023-01-03 DIAGNOSIS — G4730 Sleep apnea, unspecified: Secondary | ICD-10-CM | POA: Diagnosis not present

## 2023-01-03 DIAGNOSIS — N189 Chronic kidney disease, unspecified: Secondary | ICD-10-CM | POA: Diagnosis not present

## 2023-01-03 DIAGNOSIS — J45909 Unspecified asthma, uncomplicated: Secondary | ICD-10-CM | POA: Diagnosis not present

## 2023-01-03 DIAGNOSIS — K219 Gastro-esophageal reflux disease without esophagitis: Secondary | ICD-10-CM | POA: Insufficient documentation

## 2023-01-03 DIAGNOSIS — H269 Unspecified cataract: Secondary | ICD-10-CM | POA: Diagnosis not present

## 2023-01-03 DIAGNOSIS — Z87891 Personal history of nicotine dependence: Secondary | ICD-10-CM | POA: Insufficient documentation

## 2023-01-03 DIAGNOSIS — E785 Hyperlipidemia, unspecified: Secondary | ICD-10-CM | POA: Diagnosis not present

## 2023-01-03 DIAGNOSIS — H2512 Age-related nuclear cataract, left eye: Secondary | ICD-10-CM | POA: Insufficient documentation

## 2023-01-03 HISTORY — PX: CATARACT EXTRACTION W/PHACO: SHX586

## 2023-01-03 SURGERY — PHACOEMULSIFICATION, CATARACT, WITH IOL INSERTION
Anesthesia: Monitor Anesthesia Care | Laterality: Left

## 2023-01-03 MED ORDER — SIGHTPATH DOSE#1 BSS IO SOLN
INTRAOCULAR | Status: DC | PRN
  Administered 2023-01-03: 15 mL via INTRAOCULAR

## 2023-01-03 MED ORDER — TETRACAINE HCL 0.5 % OP SOLN
1.0000 [drp] | OPHTHALMIC | Status: DC | PRN
  Administered 2023-01-03 (×3): 1 [drp] via OPHTHALMIC

## 2023-01-03 MED ORDER — SIGHTPATH DOSE#1 BSS IO SOLN
INTRAOCULAR | Status: DC | PRN
  Administered 2023-01-03: 60 mL via OPHTHALMIC

## 2023-01-03 MED ORDER — CEFUROXIME OPHTHALMIC INJECTION 1 MG/0.1 ML
INJECTION | OPHTHALMIC | Status: DC | PRN
  Administered 2023-01-03: 1 mg via INTRACAMERAL

## 2023-01-03 MED ORDER — TETRACAINE HCL 0.5 % OP SOLN: 1.0000 [drp] | OPHTHALMIC | Status: DC | PRN

## 2023-01-03 MED ORDER — LACTATED RINGERS IV SOLN: INTRAVENOUS | Status: DC

## 2023-01-03 MED ORDER — BRIMONIDINE TARTRATE-TIMOLOL 0.2-0.5 % OP SOLN
OPHTHALMIC | Status: DC | PRN
  Administered 2023-01-03: 1 [drp] via OPHTHALMIC

## 2023-01-03 MED ORDER — ARMC OPHTHALMIC DILATING DROPS: 1.0000 | OPHTHALMIC | Status: DC | PRN

## 2023-01-03 MED ORDER — SIGHTPATH DOSE#1 BSS IO SOLN
INTRAOCULAR | Status: DC | PRN
  Administered 2023-01-03: 2 mL

## 2023-01-03 MED ORDER — FENTANYL CITRATE (PF) 100 MCG/2ML IJ SOLN
INTRAMUSCULAR | Status: DC | PRN
  Administered 2023-01-03: 50 ug via INTRAVENOUS

## 2023-01-03 MED ORDER — SIGHTPATH DOSE#1 NA HYALUR & NA CHOND-NA HYALUR IO KIT
PACK | INTRAOCULAR | Status: DC | PRN
  Administered 2023-01-03: 1 via OPHTHALMIC

## 2023-01-03 MED ORDER — ARMC OPHTHALMIC DILATING DROPS
1.0000 | OPHTHALMIC | Status: DC | PRN
  Administered 2023-01-03 (×3): 1 via OPHTHALMIC

## 2023-01-03 MED ORDER — MIDAZOLAM HCL 2 MG/2ML IJ SOLN
INTRAMUSCULAR | Status: DC | PRN
  Administered 2023-01-03: 1 mg via INTRAVENOUS

## 2023-01-03 SURGICAL SUPPLY — 9 items
CATARACT SUITE SIGHTPATH (MISCELLANEOUS) ×1
FEE CATARACT SUITE SIGHTPATH (MISCELLANEOUS) ×1 IMPLANT
GLOVE SRG 8 PF TXTR STRL LF DI (GLOVE) ×1 IMPLANT
GLOVE SURG ENC TEXT LTX SZ7.5 (GLOVE) ×1 IMPLANT
GLOVE SURG UNDER POLY LF SZ8 (GLOVE) ×1
LENS IOL TECNIS EYHANCE 20.0 (Intraocular Lens) IMPLANT
NDL FILTER BLUNT 18X1 1/2 (NEEDLE) ×1 IMPLANT
NEEDLE FILTER BLUNT 18X1 1/2 (NEEDLE) ×1
SYR 3ML LL SCALE MARK (SYRINGE) ×1 IMPLANT

## 2023-01-03 NOTE — H&P (Signed)
[facility]   Primary Care Physician:  Excell Seltzer, MD Ophthalmologist: Dr. Lockie Mola  Pre-Procedure History & Physical: HPI:  Rebekah Johnston is a 72 y.o. female here for ophthalmic surgery.   Past Medical History:  Diagnosis Date   Asthma    no inhalers   Bronchitis    taking meds now   Chronic kidney disease 2008   Stage G3b/A1   COVID-19 11/20/2022   Resolved   Depression    GERD (gastroesophageal reflux disease)    Glaucoma    no eye drops   Hyperlipidemia    no meds   Macular degeneration    Bil   Osteoarthritis    Osteopenia    Sleep apnea    No CPAP    Past Surgical History:  Procedure Laterality Date   BUNIONECTOMY     rt. foot   CHOLECYSTECTOMY     COLONOSCOPY WITH PROPOFOL N/A 01/09/2022   Procedure: COLONOSCOPY POLYPECTOMY WITH PROPOFOL CLIP APPLIED AT CECAL POLYP SITE;  Surgeon: Midge Minium, MD;  Location: [facility];  Service: Endoscopy;  Laterality: N/A;  sleep apnea   HERNIA REPAIR     left inguinal hernia   TONSILLECTOMY  1967    Prior to Admission medications   Medication Sig Start Date End Date Taking? Authorizing Provider  acetaminophen (TYLENOL) 325 MG tablet Take 650 mg by mouth every 6 (six) hours as needed.   Yes [provider]  buPROPion (WELLBUTRIN XL) 150 MG 24 hr tablet Take 1 tablet (150 mg total) by mouth daily. 08/17/22  Yes Bedsole, Amy E, MD  citalopram (CELEXA) 40 MG tablet TAKE 1 TABLET BY MOUTH EVERY DAY 08/01/22  Yes Bedsole, Amy E, MD  diclofenac Sodium (VOLTAREN) 1 % GEL as needed.   Yes [provider]  nystatin cream (MYCOSTATIN)    Yes [provider]  pantoprazole (PROTONIX) 40 MG tablet TAKE 1 TABLET BY MOUTH EVERY DAY IN THE MORNING 10/23/22  Yes Adrian Blackwater A, MD  rosuvastatin (CRESTOR) 5 MG tablet TAKE 1 TABLET BY MOUTH EVERY DAY 10/23/22  Yes Bedsole, Amy E, MD  ipratropium (ATROVENT) 0.06 % nasal spray Place 2 sprays into both nostrils 4 (four) times  daily. Patient not taking: Reported on 12/27/2022 10/23/22   Becky Augusta, NP    Allergies as of 12/25/2022   (No Known Allergies)    Family History  Problem Relation Age of Onset   Prostate cancer Father    Dementia Mother    Diabetes Brother    Melanoma Other        Uncle   Colon cancer Neg Hx    Esophageal cancer Neg Hx    Rectal cancer Neg Hx    Stomach cancer Neg Hx     Social History   Socioeconomic History   Marital status: Divorced    Spouse name: Not on file   Number of children: 3   Years of education: Not on file   Highest education level: 12th grade  Occupational History   Occupation: Airline pilot: UNEMPLOYED  Tobacco Use   Smoking status: Former    Current packs/day: 0.00    Average packs/day: 0.3 packs/day for 3.0 years (0.8 ttl pk-yrs)    Types: Cigarettes    Start date: 05/23/1998    Quit date: 05/23/2001    Years since quitting: 21.6   Smokeless tobacco: Never   Tobacco comments:    smoked socially not everyday thing.  Vaping Use  Vaping status: Never Used  Substance and Sexual Activity   Alcohol use: Yes    Alcohol/week: 1.0 standard drink of alcohol    Types: 1 Glasses of wine per week    Comment: occasional once a month   Drug use: No   Sexual activity: Not on file  Other Topics Concern   Not on file  Social History Narrative   Daily caffeine   Social Determinants of Health   Financial Resource Strain: Low Risk  (08/13/2022)   Overall Financial Resource Strain (CARDIA)    Difficulty of Paying Living Expenses: Not very hard  Food Insecurity: No Food Insecurity (08/13/2022)   Hunger Vital Sign    Worried About Running Out of Food in the Last Year: Never true    Ran Out of Food in the Last Year: Never true  Transportation Needs: No Transportation Needs (08/13/2022)   PRAPARE - Administrator, Civil Service (Medical): No    Lack of Transportation (Non-Medical): No  Physical Activity: Insufficiently Active (08/13/2022)    Exercise Vital Sign    Days of Exercise per Week: 2 days    Minutes of Exercise per Session: 60 min  Stress: Stress Concern Present (08/13/2022)   [employer] of Occupational Health - Occupational Stress Questionnaire    Feeling of Stress : Rather much  Social Connections: Moderately Isolated (08/13/2022)   Social Connection and Isolation Panel [NHANES]    Frequency of Communication with Friends and Family: Once a week    Frequency of Social Gatherings with Friends and Family: Once a week    Attends Religious Services: More than 4 times per year    Active Member of [employer] or Organizations: Yes    Attends Banker Meetings: 1 to 4 times per year    Marital Status: Divorced  Catering manager Violence: Not At Risk (11/17/2021)   Humiliation, Afraid, Rape, and Kick questionnaire    Fear of Current or Ex-Partner: No    Emotionally Abused: No    Physically Abused: No    Sexually Abused: No    Review of Systems: See HPI, otherwise negative ROS  Physical Exam: BP (!) 141/80   Pulse (!) 58   Temp (!) 97 F (36.1 C) (Temporal)   Resp 10   Ht 5\' 3"  (1.6 m)   Wt 79.2 kg   SpO2 96%   BMI 30.93 kg/m  General:   Alert,  pleasant and cooperative in NAD Head:  Normocephalic and atraumatic. Lungs:  Clear to auscultation.    Heart:  Regular rate and rhythm.   Impression/Plan: Rebekah Johnston is here for ophthalmic surgery.  Risks, benefits, limitations, and alternatives regarding ophthalmic surgery have been reviewed with the patient.  Questions have been answered.  All parties agreeable.   Lockie Mola, MD  01/03/2023, 11:05 AM

## 2023-01-03 NOTE — Transfer of Care (Signed)
Immediate Anesthesia Transfer of Care Note  Patient: Rebekah Johnston  Procedure(s) Performed: CATARACT EXTRACTION PHACO AND INTRAOCULAR LENS PLACEMENT (IOC) LEFT 4.51 (Left)  Patient Location: PACU  Anesthesia Type: MAC  Level of Consciousness: awake, alert  and patient cooperative  Airway and Oxygen Therapy: Patient Spontanous Breathing and Patient connected to supplemental oxygen  Post-op Assessment: Post-op Vital signs reviewed, Patient's Cardiovascular Status Stable, Respiratory Function Stable, Patent Airway and No signs of Nausea or vomiting  Post-op Vital Signs: Reviewed and stable  Complications: No notable events documented.

## 2023-01-03 NOTE — Op Note (Signed)
OPERATIVE NOTE  Rebekah Johnston [id number] 01/03/2023   PREOPERATIVE DIAGNOSIS:  Nuclear sclerotic cataract left eye. H25.12   POSTOPERATIVE DIAGNOSIS:    Nuclear sclerotic cataract left eye.     PROCEDURE:  Phacoemusification with posterior chamber intraocular lens placement of the left eye  Ultrasound time: Procedure(s): CATARACT EXTRACTION PHACO AND INTRAOCULAR LENS PLACEMENT (IOC) LEFT 4.51 (Left)  LENS:   Implant Name Type Inv. Item Serial No. Manufacturer Lot No. LRB No. Used Action  LENS IOL TECNIS EYHANCE 20.0 - [id number] Intraocular Lens LENS IOL TECNIS EYHANCE 20.0 [id number] [employer]  Left 1 Implanted      SURGEON:  Deirdre Evener, MD   ANESTHESIA:  Topical with tetracaine drops and 2% Xylocaine jelly, augmented with 1% preservative-free intracameral lidocaine.    COMPLICATIONS:  None.   DESCRIPTION OF PROCEDURE:  The patient was identified in the holding room and transported to the operating room and placed in the supine position under the operating microscope.  The left eye was identified as the operative eye and it was prepped and draped in the usual sterile ophthalmic fashion.   A 1 millimeter clear-corneal paracentesis was made at the 1:30 position.  0.5 ml of preservative-free 1% lidocaine was injected into the anterior chamber.  The anterior chamber was filled with Viscoat viscoelastic.  A 2.4 millimeter keratome was used to make a near-clear corneal incision at the 10:30 position.  .  A curvilinear capsulorrhexis was made with a cystotome and capsulorrhexis forceps.  Balanced salt solution was used to hydrodissect and hydrodelineate the nucleus.   Phacoemulsification was then used in stop and chop fashion to remove the lens nucleus and epinucleus.  The remaining cortex was then removed using the irrigation and aspiration handpiece. Provisc was then placed into the capsular bag to distend it for lens placement.  A lens was then injected into the capsular  bag.  The remaining viscoelastic was aspirated.   Wounds were hydrated with balanced salt solution.  The anterior chamber was inflated to a physiologic pressure with balanced salt solution.  No wound leaks were noted. Cefuroxime 0.1 ml of a 10mg /ml solution was injected into the anterior chamber for a dose of 1 mg of intracameral antibiotic at the completion of the case.   Timolol and Brimonidine drops were applied to the eye.  The patient was taken to the recovery room in stable condition without complications of anesthesia or surgery.  Rebekah Johnston 01/03/2023, 12:13 PM

## 2023-01-03 NOTE — Anesthesia Postprocedure Evaluation (Signed)
Anesthesia Post Note  Patient: Rebekah Johnston  Procedure(s) Performed: CATARACT EXTRACTION PHACO AND INTRAOCULAR LENS PLACEMENT (IOC) LEFT 4.51 (Left)  Patient location during evaluation: PACU Anesthesia Type: MAC Level of consciousness: awake and alert Pain management: pain level controlled Vital Signs Assessment: post-procedure vital signs reviewed and stable Respiratory status: spontaneous breathing, nonlabored ventilation, respiratory function stable and patient connected to nasal cannula oxygen Cardiovascular status: stable and blood pressure returned to baseline Postop Assessment: no apparent nausea or vomiting Anesthetic complications: no   No notable events documented.   Last Vitals:  Vitals:   01/03/23 1215 01/03/23 1220  BP: 132/75 123/85  Pulse: 64 69  Resp: 10 13  Temp: (!) 36.1 C (!) 36.1 C  SpO2: 95% 95%    Last Pain:  Vitals:   01/03/23 1220  TempSrc:   PainSc: 0-No pain                 Joden Bonsall C Rowena Moilanen

## 2023-01-04 DIAGNOSIS — H2511 Age-related nuclear cataract, right eye: Secondary | ICD-10-CM | POA: Diagnosis not present

## 2023-01-04 DIAGNOSIS — H2512 Age-related nuclear cataract, left eye: Secondary | ICD-10-CM | POA: Diagnosis not present

## 2023-01-05 ENCOUNTER — Encounter: Payer: Self-pay | Admitting: Ophthalmology

## 2023-01-05 DIAGNOSIS — M5416 Radiculopathy, lumbar region: Secondary | ICD-10-CM | POA: Diagnosis not present

## 2023-01-08 DIAGNOSIS — M5416 Radiculopathy, lumbar region: Secondary | ICD-10-CM | POA: Diagnosis not present

## 2023-01-10 DIAGNOSIS — M5416 Radiculopathy, lumbar region: Secondary | ICD-10-CM | POA: Diagnosis not present

## 2023-01-15 DIAGNOSIS — M5416 Radiculopathy, lumbar region: Secondary | ICD-10-CM | POA: Diagnosis not present

## 2023-01-15 NOTE — Discharge Instructions (Signed)

## 2023-01-16 DIAGNOSIS — M5416 Radiculopathy, lumbar region: Secondary | ICD-10-CM | POA: Diagnosis not present

## 2023-01-16 NOTE — Anesthesia Preprocedure Evaluation (Signed)
Anesthesia Evaluation  Patient identified by MRN, date of birth, ID band Patient awake    Reviewed: Allergy & Precautions, H&P , NPO status , Patient's Chart, lab work & pertinent test results  Airway Mallampati: II  TM Distance: >3 FB Neck ROM: Full    Dental no notable dental hx. (+) Poor Dentition   Pulmonary asthma , sleep apnea , former smoker   Pulmonary exam normal breath sounds clear to auscultation       Cardiovascular negative cardio ROS Normal cardiovascular exam Rhythm:Regular Rate:Normal     Neuro/Psych  PSYCHIATRIC DISORDERS  Depression     Neuromuscular disease    GI/Hepatic Neg liver ROS,GERD  ,,  Endo/Other  negative endocrine ROS    Renal/GU Renal disease  negative genitourinary   Musculoskeletal negative musculoskeletal ROS (+) Arthritis ,    Abdominal   Peds negative pediatric ROS (+)  Hematology negative hematology ROS (+)   Anesthesia Other Findings Previous cataract surgery 01/03/23 Dr. Juel Burrow anesthesiologist  Osteoarthritis  Depression Asthma  Bronchitis GERD (gastroesophageal reflux disease) Glaucoma Macular degeneration Chronic kidney disease Hyperlipidemia  Sleep apnea Osteopenia  COVID-19    Reproductive/Obstetrics negative OB ROS                             Anesthesia Physical Anesthesia Plan  ASA: 3  Anesthesia Plan: MAC   Post-op Pain Management: Minimal or no pain anticipated   Induction: Intravenous  PONV Risk Score and Plan: 1 and Ondansetron and Treatment may vary due to age or medical condition  Airway Management Planned: Natural Airway and Nasal Cannula  Additional Equipment:   Intra-op Plan:   Post-operative Plan:   Informed Consent: I have reviewed the patients History and Physical, chart, labs and discussed the procedure including the risks, benefits and alternatives for the proposed anesthesia with the patient or  authorized representative who has indicated his/her understanding and acceptance.     Dental Advisory Given  Plan Discussed with: Anesthesiologist, CRNA and Surgeon  Anesthesia Plan Comments: (Patient consented for risks of anesthesia including but not limited to:  - adverse reactions to medications - damage to eyes, teeth, lips or other oral mucosa - nerve damage due to positioning  - sore throat or hoarseness - Damage to heart, brain, nerves, lungs, other parts of body or loss of life  Patient voiced understanding.)        Anesthesia Quick Evaluation

## 2023-01-17 ENCOUNTER — Ambulatory Visit: Payer: PPO | Admitting: Anesthesiology

## 2023-01-17 ENCOUNTER — Encounter
Admission: RE | Disposition: A | Discharge status: Home / Self Care | Payer: Self-pay | Source: Home / Self Care | Attending: Ophthalmology

## 2023-01-17 ENCOUNTER — Ambulatory Visit
Admission: RE | Admit: 2023-01-17 | Discharge: 2023-01-17 | Disposition: A | Discharge status: Home / Self Care | Payer: PPO | Attending: Ophthalmology | Admitting: Ophthalmology

## 2023-01-17 ENCOUNTER — Encounter: Payer: Self-pay | Admitting: Ophthalmology

## 2023-01-17 ENCOUNTER — Other Ambulatory Visit: Payer: Self-pay

## 2023-01-17 DIAGNOSIS — H269 Unspecified cataract: Secondary | ICD-10-CM | POA: Diagnosis not present

## 2023-01-17 DIAGNOSIS — N189 Chronic kidney disease, unspecified: Secondary | ICD-10-CM | POA: Insufficient documentation

## 2023-01-17 DIAGNOSIS — F32A Depression, unspecified: Secondary | ICD-10-CM | POA: Diagnosis not present

## 2023-01-17 DIAGNOSIS — Z87891 Personal history of nicotine dependence: Secondary | ICD-10-CM | POA: Diagnosis not present

## 2023-01-17 DIAGNOSIS — G4730 Sleep apnea, unspecified: Secondary | ICD-10-CM | POA: Insufficient documentation

## 2023-01-17 DIAGNOSIS — M1990 Unspecified osteoarthritis, unspecified site: Secondary | ICD-10-CM | POA: Diagnosis not present

## 2023-01-17 DIAGNOSIS — K219 Gastro-esophageal reflux disease without esophagitis: Secondary | ICD-10-CM | POA: Insufficient documentation

## 2023-01-17 DIAGNOSIS — H2511 Age-related nuclear cataract, right eye: Secondary | ICD-10-CM | POA: Insufficient documentation

## 2023-01-17 DIAGNOSIS — M8580 Other specified disorders of bone density and structure, unspecified site: Secondary | ICD-10-CM | POA: Diagnosis not present

## 2023-01-17 DIAGNOSIS — J45909 Unspecified asthma, uncomplicated: Secondary | ICD-10-CM | POA: Insufficient documentation

## 2023-01-17 HISTORY — PX: CATARACT EXTRACTION W/PHACO: SHX586

## 2023-01-17 SURGERY — PHACOEMULSIFICATION, CATARACT, WITH IOL INSERTION
Anesthesia: Monitor Anesthesia Care | Site: Eye | Laterality: Right

## 2023-01-17 MED ORDER — FENTANYL CITRATE (PF) 100 MCG/2ML IJ SOLN
INTRAMUSCULAR | Status: DC | PRN
  Administered 2023-01-17: 50 ug via INTRAVENOUS

## 2023-01-17 MED ORDER — MIDAZOLAM HCL 2 MG/2ML IJ SOLN
INTRAMUSCULAR | Status: DC | PRN
  Administered 2023-01-17: 1 mg via INTRAVENOUS

## 2023-01-17 MED ORDER — FENTANYL CITRATE (PF) 100 MCG/2ML IJ SOLN
INTRAMUSCULAR | Status: AC
  Filled 2023-01-17: qty 2

## 2023-01-17 MED ORDER — BRIMONIDINE TARTRATE-TIMOLOL 0.2-0.5 % OP SOLN
OPHTHALMIC | Status: DC | PRN
  Administered 2023-01-17: 1 [drp] via OPHTHALMIC

## 2023-01-17 MED ORDER — SIGHTPATH DOSE#1 NA HYALUR & NA CHOND-NA HYALUR IO KIT
PACK | INTRAOCULAR | Status: DC | PRN
  Administered 2023-01-17: 1 via OPHTHALMIC

## 2023-01-17 MED ORDER — TETRACAINE HCL 0.5 % OP SOLN
1.0000 [drp] | OPHTHALMIC | Status: AC
  Administered 2023-01-17 (×3): 1 [drp] via OPHTHALMIC

## 2023-01-17 MED ORDER — MIDAZOLAM HCL 2 MG/2ML IJ SOLN
INTRAMUSCULAR | Status: AC
  Filled 2023-01-17: qty 2

## 2023-01-17 MED ORDER — SIGHTPATH DOSE#1 BSS IO SOLN
INTRAOCULAR | Status: DC | PRN
  Administered 2023-01-17: 15 mL

## 2023-01-17 MED ORDER — LACTATED RINGERS IV SOLN: INTRAVENOUS | Status: DC

## 2023-01-17 MED ORDER — CEFUROXIME OPHTHALMIC INJECTION 1 MG/0.1 ML
INJECTION | OPHTHALMIC | Status: DC | PRN
  Administered 2023-01-17: .1 mL via INTRACAMERAL

## 2023-01-17 MED ORDER — ARMC OPHTHALMIC DILATING DROPS
1.0000 | OPHTHALMIC | Status: AC
  Administered 2023-01-17 (×3): 1 via OPHTHALMIC

## 2023-01-17 MED ORDER — SIGHTPATH DOSE#1 BSS IO SOLN
INTRAOCULAR | Status: DC | PRN
  Administered 2023-01-17: 1 mL via INTRAMUSCULAR

## 2023-01-17 MED ORDER — TETRACAINE HCL 0.5 % OP SOLN
OPHTHALMIC | Status: AC
  Filled 2023-01-17: qty 4

## 2023-01-17 MED ORDER — SIGHTPATH DOSE#1 BSS IO SOLN
INTRAOCULAR | Status: DC | PRN
  Administered 2023-01-17: 66 mL via OPHTHALMIC

## 2023-01-17 SURGICAL SUPPLY — 9 items
CATARACT SUITE SIGHTPATH (MISCELLANEOUS) ×1
FEE CATARACT SUITE SIGHTPATH (MISCELLANEOUS) ×1 IMPLANT
GLOVE SRG 8 PF TXTR STRL LF DI (GLOVE) ×1 IMPLANT
GLOVE SURG ENC TEXT LTX SZ7.5 (GLOVE) ×1 IMPLANT
GLOVE SURG UNDER POLY LF SZ8 (GLOVE) ×1
LENS IOL TECNIS EYHANCE 19.5 (Intraocular Lens) IMPLANT
NDL FILTER BLUNT 18X1 1/2 (NEEDLE) ×1 IMPLANT
NEEDLE FILTER BLUNT 18X1 1/2 (NEEDLE) ×1
SYR 3ML LL SCALE MARK (SYRINGE) ×1 IMPLANT

## 2023-01-17 NOTE — Transfer of Care (Signed)
Immediate Anesthesia Transfer of Care Note  Patient: Rebekah Johnston  Procedure(s) Performed: CATARACT EXTRACTION PHACO AND INTRAOCULAR LENS PLACEMENT (IOC) RIGHT  6.51  00:53.3 (Right: Eye)  Patient Location: PACU  Anesthesia Type: MAC  Level of Consciousness: awake, alert  and patient cooperative  Airway and Oxygen Therapy: Patient Spontanous Breathing and Patient connected to supplemental oxygen  Post-op Assessment: Post-op Vital signs reviewed, Patient's Cardiovascular Status Stable, Respiratory Function Stable, Patent Airway and No signs of Nausea or vomiting  Post-op Vital Signs: Reviewed and stable  Complications: No notable events documented.

## 2023-01-17 NOTE — Anesthesia Postprocedure Evaluation (Signed)
Anesthesia Post Note  Patient: MERIEM MIELNIK  Procedure(s) Performed: CATARACT EXTRACTION PHACO AND INTRAOCULAR LENS PLACEMENT (IOC) RIGHT  6.51  00:53.3 (Right: Eye)  Patient location during evaluation: PACU Anesthesia Type: MAC Level of consciousness: awake and alert Pain management: pain level controlled Vital Signs Assessment: post-procedure vital signs reviewed and stable Respiratory status: spontaneous breathing, nonlabored ventilation and respiratory function stable Cardiovascular status: blood pressure returned to baseline and stable Postop Assessment: no apparent nausea or vomiting Anesthetic complications: no   No notable events documented.   Last Vitals:  Vitals:   01/17/23 0848 01/17/23 0853  BP: 123/61 126/74  Pulse: (!) 54 (!) 52  Resp: 13 15  Temp: (!) 36.2 C (!) 36.2 C  SpO2: 95% 96%    Last Pain:  Vitals:   01/17/23 0853  TempSrc:   PainSc: 0-No pain                 Marisue Humble

## 2023-01-17 NOTE — H&P (Signed)
[facility]   Primary Care Physician:  Excell Seltzer, MD Ophthalmologist: Dr. Lockie Mola  Pre-Procedure History & Physical: HPI:  Rebekah Johnston is a 72 y.o. female here for ophthalmic surgery.   Past Medical History:  Diagnosis Date   Asthma    no inhalers   Bronchitis    taking meds now   Chronic kidney disease 2008   Stage G3b/A1   COVID-19 11/20/2022   Resolved   Depression    GERD (gastroesophageal reflux disease)    Glaucoma    no eye drops   Hyperlipidemia    no meds   Macular degeneration    Bil   Osteoarthritis    Osteopenia    Sleep apnea    No CPAP    Past Surgical History:  Procedure Laterality Date   BUNIONECTOMY     rt. foot   CATARACT EXTRACTION W/PHACO Left 01/03/2023   Procedure: CATARACT EXTRACTION PHACO AND INTRAOCULAR LENS PLACEMENT (IOC) LEFT 4.51;  Surgeon: Lockie Mola, MD;  Location: [facility];  Service: Ophthalmology;  Laterality: Left;   CHOLECYSTECTOMY     COLONOSCOPY WITH PROPOFOL N/A 01/09/2022   Procedure: COLONOSCOPY POLYPECTOMY WITH PROPOFOL CLIP APPLIED AT CECAL POLYP SITE;  Surgeon: Midge Minium, MD;  Location: [facility];  Service: Endoscopy;  Laterality: N/A;  sleep apnea   HERNIA REPAIR     left inguinal hernia   TONSILLECTOMY  1967    Prior to Admission medications   Medication Sig Start Date End Date Taking? Authorizing Provider  acetaminophen (TYLENOL) 325 MG tablet Take 650 mg by mouth every 6 (six) hours as needed.   Yes [provider]  buPROPion (WELLBUTRIN XL) 150 MG 24 hr tablet Take 1 tablet (150 mg total) by mouth daily. 08/17/22  Yes Bedsole, Amy E, MD  citalopram (CELEXA) 40 MG tablet TAKE 1 TABLET BY MOUTH EVERY DAY 08/01/22  Yes Bedsole, Amy E, MD  diclofenac Sodium (VOLTAREN) 1 % GEL as needed.   Yes [provider]  nystatin cream (MYCOSTATIN)    Yes [provider]  pantoprazole (PROTONIX) 40 MG tablet TAKE 1 TABLET BY MOUTH EVERY DAY IN  THE MORNING 10/23/22  Yes Adrian Blackwater A, MD  rosuvastatin (CRESTOR) 5 MG tablet TAKE 1 TABLET BY MOUTH EVERY DAY 10/23/22  Yes Bedsole, Amy E, MD  ipratropium (ATROVENT) 0.06 % nasal spray Place 2 sprays into both nostrils 4 (four) times daily. Patient not taking: Reported on 12/27/2022 10/23/22   Becky Augusta, NP    Allergies as of 12/25/2022   (No Known Allergies)    Family History  Problem Relation Age of Onset   Prostate cancer Father    Dementia Mother    Diabetes Brother    Melanoma Other        Uncle   Colon cancer Neg Hx    Esophageal cancer Neg Hx    Rectal cancer Neg Hx    Stomach cancer Neg Hx     Social History   Socioeconomic History   Marital status: Divorced    Spouse name: Not on file   Number of children: 3   Years of education: Not on file   Highest education level: 12th grade  Occupational History   Occupation: Airline pilot: UNEMPLOYED  Tobacco Use   Smoking status: Former    Current packs/day: 0.00    Average packs/day: 0.3 packs/day for 3.0 years (0.8 ttl pk-yrs)    Types: Cigarettes  Start date: 05/23/1998    Quit date: 05/23/2001    Years since quitting: 21.6   Smokeless tobacco: Never   Tobacco comments:    smoked socially not everyday thing.  Vaping Use   Vaping status: Never Used  Substance and Sexual Activity   Alcohol use: Yes    Alcohol/week: 1.0 standard drink of alcohol    Types: 1 Glasses of wine per week    Comment: occasional once a month   Drug use: No   Sexual activity: Not on file  Other Topics Concern   Not on file  Social History Narrative   Daily caffeine   Social Determinants of Health   Financial Resource Strain: Low Risk  (08/13/2022)   Overall Financial Resource Strain (CARDIA)    Difficulty of Paying Living Expenses: Not very hard  Food Insecurity: No Food Insecurity (08/13/2022)   Hunger Vital Sign    Worried About Running Out of Food in the Last Year: Never true    Ran Out of Food in the Last Year:  Never true  Transportation Needs: No Transportation Needs (08/13/2022)   PRAPARE - Administrator, Civil Service (Medical): No    Lack of Transportation (Non-Medical): No  Physical Activity: Insufficiently Active (08/13/2022)   Exercise Vital Sign    Days of Exercise per Week: 2 days    Minutes of Exercise per Session: 60 min  Stress: Stress Concern Present (08/13/2022)   [employer] of Occupational Health - Occupational Stress Questionnaire    Feeling of Stress : Rather much  Social Connections: Moderately Isolated (08/13/2022)   Social Connection and Isolation Panel [NHANES]    Frequency of Communication with Friends and Family: Once a week    Frequency of Social Gatherings with Friends and Family: Once a week    Attends Religious Services: More than 4 times per year    Active Member of [employer] or Organizations: Yes    Attends Banker Meetings: 1 to 4 times per year    Marital Status: Divorced  Catering manager Violence: Not At Risk (11/17/2021)   Humiliation, Afraid, Rape, and Kick questionnaire    Fear of Current or Ex-Partner: No    Emotionally Abused: No    Physically Abused: No    Sexually Abused: No    Review of Systems: See HPI, otherwise negative ROS  Physical Exam: BP 120/80   Pulse (!) 52   Temp 97.7 F (36.5 C) (Temporal)   Resp 18   Ht 5\' 3"  (1.6 m)   Wt 80.3 kg   SpO2 98%   BMI 31.35 kg/m  General:   Alert,  pleasant and cooperative in NAD Head:  Normocephalic and atraumatic. Lungs:  Clear to auscultation.    Heart:  Regular rate and rhythm.   Impression/Plan: Rebekah Johnston is here for ophthalmic surgery.  Risks, benefits, limitations, and alternatives regarding ophthalmic surgery have been reviewed with the patient.  Questions have been answered.  All parties agreeable.   Lockie Mola, MD  01/17/2023, 8:05 AM

## 2023-01-17 NOTE — Op Note (Signed)
LOCATION:  [facility]   PREOPERATIVE DIAGNOSIS:    Nuclear sclerotic cataract right eye. H25.11   POSTOPERATIVE DIAGNOSIS:  Nuclear sclerotic cataract right eye.     PROCEDURE:  Phacoemusification with posterior chamber intraocular lens placement of the right eye   ULTRASOUND TIME: Procedure(s): CATARACT EXTRACTION PHACO AND INTRAOCULAR LENS PLACEMENT (IOC) RIGHT  6.51  00:53.3 (Right)  LENS:   Implant Name Type Inv. Item Serial No. Manufacturer Lot No. LRB No. Used Action  LENS IOL TECNIS EYHANCE 19.5 - [id number] Intraocular Lens LENS IOL TECNIS EYHANCE 19.5 [id number] [employer]  Right 1 Implanted         SURGEON:  Deirdre Evener, MD   ANESTHESIA:  Topical with tetracaine drops and 2% Xylocaine jelly, augmented with 1% preservative-free intracameral lidocaine.    COMPLICATIONS:  None.   DESCRIPTION OF PROCEDURE:  The patient was identified in the holding room and transported to the operating room and placed in the supine position under the operating microscope.  The right eye was identified as the operative eye and it was prepped and draped in the usual sterile ophthalmic fashion.   A 1 millimeter clear-corneal paracentesis was made at the 12:00 position.  0.5 ml of preservative-free 1% lidocaine was injected into the anterior chamber. The anterior chamber was filled with Viscoat viscoelastic.  A 2.4 millimeter keratome was used to make a near-clear corneal incision at the 9:00 position.  A curvilinear capsulorrhexis was made with a cystotome and capsulorrhexis forceps.  Balanced salt solution was used to hydrodissect and hydrodelineate the nucleus.   Phacoemulsification was then used in stop and chop fashion to remove the lens nucleus and epinucleus.  The remaining cortex was then removed using the irrigation and aspiration handpiece. Provisc was then placed into the capsular bag to distend it for lens placement.  A lens was then injected into the capsular bag.   The remaining viscoelastic was aspirated.   Wounds were hydrated with balanced salt solution.  The anterior chamber was inflated to a physiologic pressure with balanced salt solution.  No wound leaks were noted. Cefuroxime 0.1 ml of a 10mg /ml solution was injected into the anterior chamber for a dose of 1 mg of intracameral antibiotic at the completion of the case.   Timolol and Brimonidine drops were applied to the eye.  The patient was taken to the recovery room in stable condition without complications of anesthesia or surgery.   Murrel Bertram 01/17/2023, 8:46 AM

## 2023-01-23 DIAGNOSIS — M5416 Radiculopathy, lumbar region: Secondary | ICD-10-CM | POA: Diagnosis not present

## 2023-01-26 DIAGNOSIS — M5416 Radiculopathy, lumbar region: Secondary | ICD-10-CM | POA: Diagnosis not present

## 2023-01-30 DIAGNOSIS — M5416 Radiculopathy, lumbar region: Secondary | ICD-10-CM | POA: Diagnosis not present

## 2023-02-02 DIAGNOSIS — N1831 Chronic kidney disease, stage 3a: Secondary | ICD-10-CM | POA: Diagnosis not present

## 2023-02-02 DIAGNOSIS — Z6830 Body mass index (BMI) 30.0-30.9, adult: Secondary | ICD-10-CM | POA: Diagnosis not present

## 2023-02-02 DIAGNOSIS — M545 Low back pain, unspecified: Secondary | ICD-10-CM | POA: Diagnosis not present

## 2023-02-02 DIAGNOSIS — G8929 Other chronic pain: Secondary | ICD-10-CM | POA: Diagnosis not present

## 2023-02-02 DIAGNOSIS — I129 Hypertensive chronic kidney disease with stage 1 through stage 4 chronic kidney disease, or unspecified chronic kidney disease: Secondary | ICD-10-CM | POA: Diagnosis not present

## 2023-02-02 DIAGNOSIS — F3341 Major depressive disorder, recurrent, in partial remission: Secondary | ICD-10-CM | POA: Diagnosis not present

## 2023-02-14 ENCOUNTER — Other Ambulatory Visit: Payer: Self-pay | Admitting: Family Medicine

## 2023-03-06 ENCOUNTER — Other Ambulatory Visit: Payer: Self-pay | Admitting: Cardiovascular Disease

## 2023-03-06 DIAGNOSIS — R0789 Other chest pain: Secondary | ICD-10-CM

## 2023-04-02 ENCOUNTER — Other Ambulatory Visit: Payer: Self-pay | Admitting: Cardiovascular Disease

## 2023-04-02 DIAGNOSIS — R0789 Other chest pain: Secondary | ICD-10-CM

## 2023-04-03 ENCOUNTER — Ambulatory Visit (INDEPENDENT_AMBULATORY_CARE_PROVIDER_SITE_OTHER): Payer: PPO

## 2023-04-03 VITALS — Ht 63.0 in | Wt 170.0 lb

## 2023-04-03 DIAGNOSIS — Z0000 Encounter for general adult medical examination without abnormal findings: Secondary | ICD-10-CM

## 2023-04-03 NOTE — Patient Instructions (Signed)
Ms. Sawinski , Thank you for taking time to come for your Medicare Wellness Visit. I appreciate your ongoing commitment to your health goals. Please review the following plan we discussed and let me know if I can assist you in the future.   Referrals/Orders/Follow-Ups/Clinician Recommendations: none  This is a list of the screening recommended for you and due dates:  Health Maintenance  Topic Date Due   Zoster (Shingles) Vaccine (1 of 2) Never done   Flu Shot  11/30/2022   COVID-19 Vaccine (4 - 2023-24 season) 12/31/2022   Mammogram  12/21/2023   Medicare Annual Wellness Visit  04/02/2024   Colon Cancer Screening  01/10/2027   DTaP/Tdap/Td vaccine (4 - Td or Tdap) 06/09/2031   Pneumonia Vaccine  Completed   DEXA scan (bone density measurement)  Completed   Hepatitis C Screening  Completed   HPV Vaccine  Aged Out    Advanced directives: (In Chart) A copy of your advanced directives are scanned into your chart should your provider ever need it.  Next Medicare Annual Wellness Visit scheduled for next year: Yes 04/03/24 @ 3:40pm telephone

## 2023-04-03 NOTE — Progress Notes (Signed)
Subjective:   Rebekah Johnston is a 72 y.o. female who presents for Medicare Annual (Subsequent) preventive examination.  Visit Complete: Virtual I connected with  Dan Maker on 04/03/23 by a audio enabled telemedicine application and verified that I am speaking with the correct person using two identifiers.  Patient Location: Home  Provider Location: Office/Clinic  I discussed the limitations of evaluation and management by telemedicine. The patient expressed understanding and agreed to proceed.  Vital Signs: Because this visit was a virtual/telehealth visit, some criteria may be missing or patient reported. Any vitals not documented were not able to be obtained and vitals that have been documented are patient reported.  Patient Medicare AWV questionnaire was completed by the patient on 03/30/23; I have confirmed that all information answered by patient is correct and no changes since this date. Cardiac Risk Factors include: advanced age (>22men, >60 women);dyslipidemia;sedentary lifestyle;obesity (BMI >30kg/m2)    Objective:    Today's Vitals   04/03/23 1502  Weight: 170 lb (77.1 kg)  Height: 5\' 3"  (1.6 m)   Body mass index is 30.11 kg/m.     04/03/2023    3:20 PM 01/17/2023    7:30 AM 01/03/2023   10:47 AM 05/18/2022    4:56 PM 01/09/2022    8:23 AM 12/01/2021   10:30 AM 11/17/2021    3:44 PM  Advanced Directives  Does Patient Have a Medical Advance Directive? Yes No No Yes Yes Yes Yes  Type of Estate agent of [place];Living will   Healthcare Power of [place];Living will Healthcare Power of [place];Living will  Healthcare Power of [place];Living will  Does patient want to make changes to medical advance directive?     No - Patient declined  Yes (Inpatient - patient defers changing a medical advance directive and declines information at this time)  Copy of Healthcare Power of Attorney in Chart? Yes - validated most recent copy scanned in chart  (See row information)    No - copy requested  Yes - validated most recent copy scanned in chart (See row information)  Would patient like information on creating a medical advance directive?  No - Patient declined No - Patient declined        Current Medications (verified) Outpatient Encounter Medications as of 04/03/2023  Medication Sig   acetaminophen (TYLENOL) 325 MG tablet Take 650 mg by mouth every 6 (six) hours as needed.   buPROPion (WELLBUTRIN XL) 150 MG 24 hr tablet Take 1 tablet (150 mg total) by mouth daily.   citalopram (CELEXA) 40 MG tablet TAKE 1 TABLET BY MOUTH EVERY DAY   diclofenac Sodium (VOLTAREN) 1 % GEL as needed.   nystatin cream (MYCOSTATIN)    pantoprazole (PROTONIX) 40 MG tablet TAKE 1 TABLET BY MOUTH EVERY DAY IN THE MORNING   rosuvastatin (CRESTOR) 5 MG tablet TAKE 1 TABLET BY MOUTH EVERY DAY   ipratropium (ATROVENT) 0.06 % nasal spray Place 2 sprays into both nostrils 4 (four) times daily. (Patient not taking: Reported on 12/27/2022)   [DISCONTINUED] pantoprazole (PROTONIX) 40 MG tablet TAKE 1 TABLET BY MOUTH EVERY DAY IN THE MORNING   No facility-administered encounter medications on file as of 04/03/2023.    Allergies (verified) Patient has no known allergies.   History: Past Medical History:  Diagnosis Date   Asthma    no inhalers   Bronchitis    taking meds now   Chronic kidney disease 2008   Stage G3b/A1   COVID-19 11/20/2022  Resolved   Depression    GERD (gastroesophageal reflux disease)    Glaucoma    no eye drops   Hyperlipidemia    no meds   Macular degeneration    Bil   Osteoarthritis    Osteopenia    Sleep apnea    No CPAP   Past Surgical History:  Procedure Laterality Date   BUNIONECTOMY     rt. foot   CATARACT EXTRACTION W/PHACO Left 01/03/2023   Procedure: CATARACT EXTRACTION PHACO AND INTRAOCULAR LENS PLACEMENT (IOC) LEFT 4.51;  Surgeon: Lockie Mola, MD;  Location: [facility];  Service: Ophthalmology;   Laterality: Left;   CATARACT EXTRACTION W/PHACO Right 01/17/2023   Procedure: CATARACT EXTRACTION PHACO AND INTRAOCULAR LENS PLACEMENT (IOC) RIGHT  6.51  00:53.3;  Surgeon: Lockie Mola, MD;  Location: [facility];  Service: Ophthalmology;  Laterality: Right;   CHOLECYSTECTOMY     COLONOSCOPY WITH PROPOFOL N/A 01/09/2022   Procedure: COLONOSCOPY POLYPECTOMY WITH PROPOFOL CLIP APPLIED AT CECAL POLYP SITE;  Surgeon: Midge Minium, MD;  Location: [facility];  Service: Endoscopy;  Laterality: N/A;  sleep apnea   HERNIA REPAIR     left inguinal hernia   TONSILLECTOMY  1967   Family History  Problem Relation Age of Onset   Prostate cancer Father    Dementia Mother    Diabetes Brother    Melanoma Other        Uncle   Colon cancer Neg Hx    Esophageal cancer Neg Hx    Rectal cancer Neg Hx    Stomach cancer Neg Hx    Social History   Socioeconomic History   Marital status: Divorced    Spouse name: Not on file   Number of children: 3   Years of education: Not on file   Highest education level: 12th grade  Occupational History   Occupation: Airline pilot: UNEMPLOYED  Tobacco Use   Smoking status: Former    Current packs/day: 0.00    Average packs/day: 0.3 packs/day for 3.0 years (0.8 ttl pk-yrs)    Types: Cigarettes    Start date: 05/23/1998    Quit date: 05/23/2001    Years since quitting: 21.8   Smokeless tobacco: Never   Tobacco comments:    smoked socially not everyday thing.  Vaping Use   Vaping status: Never Used  Substance and Sexual Activity   Alcohol use: Yes    Alcohol/week: 1.0 standard drink of alcohol    Types: 1 Glasses of wine per week    Comment: occasional once a month   Drug use: No   Sexual activity: Not on file  Other Topics Concern   Not on file  Social History Narrative   Daily caffeine   Social Determinants of Health   Financial Resource Strain: Low Risk  (03/30/2023)   Overall Financial Resource Strain (CARDIA)     Difficulty of Paying Living Expenses: Not very hard  Food Insecurity: Food Insecurity Present (03/30/2023)   Hunger Vital Sign    Worried About Running Out of Food in the Last Year: Sometimes true    Ran Out of Food in the Last Year: Sometimes true  Transportation Needs: No Transportation Needs (03/30/2023)   PRAPARE - Administrator, Civil Service (Medical): No    Lack of Transportation (Non-Medical): No  Physical Activity: Inactive (03/30/2023)   Exercise Vital Sign    Days of Exercise per Week: 0 days    Minutes of Exercise per  Session: 10 min  Stress: Stress Concern Present (03/30/2023)   [employer] of Occupational Health - Occupational Stress Questionnaire    Feeling of Stress : To some extent  Social Connections: Moderately Integrated (03/30/2023)   Social Connection and Isolation Panel [NHANES]    Frequency of Communication with Friends and Family: Twice a week    Frequency of Social Gatherings with Friends and Family: Once a week    Attends Religious Services: More than 4 times per year    Active Member of [employer] or Organizations: Yes    Attends Engineer, structural: More than 4 times per year    Marital Status: Divorced    Tobacco Counseling Counseling given: Not Answered Tobacco comments: smoked socially not everyday thing.  Clinical Intake:  Pre-visit preparation completed: Yes  Pain : No/denies pain   BMI - recorded: 30.11 Nutritional Status: BMI > 30  Obese Nutritional Risks: None Diabetes: No  How often do you need to have someone help you when you read instructions, pamphlets, or other written materials from your doctor or pharmacy?: 2 - Rarely  Interpreter Needed?: No  Comments: live alone with Maxie Information entered by :: B.Stephanne Greeley,LPN  Activities of Daily Living    03/30/2023    8:27 PM 01/17/2023    7:24 AM  In your present state of health, do you have any difficulty performing the following activities:  Hearing? 0  0  Vision? 0 1  Difficulty concentrating or making decisions? 0 0  Walking or climbing stairs? 1 0  Dressing or bathing? 0 0  Doing errands, shopping? 0   Preparing Food and eating ? N   Using the Toilet? N   In the past six months, have you accidently leaked urine? Y   Do you have problems with loss of bowel control? Y   Managing your Medications? N   Managing your Finances? N   Housekeeping or managing your Housekeeping? N     Patient Care Team: Excell Seltzer, MD as PCP - Sammuel Hines, DC as Referring Physician (Chiropractic Medicine) Lockie Mola, MD as Referring Physician (Ophthalmology)  Indicate any recent Medical Services you may have received from other than [facility] providers in the past year (date may be approximate).     Assessment:   This is a routine wellness examination for Zen.  Hearing/Vision screen Hearing Screening - Comments:: Pt says her hearing is good;tinnitus Vision Screening - Comments:: Pt says her vision is good after cataract surgery Dr Inez Pilgrim   Goals Addressed             This Visit's Progress    COMPLETED: DIET - EAT MORE FRUITS AND VEGETABLES   On track    COMPLETED: Increase physical activity   On track    Starting 04/17/2017, I will continue to use weighted hula hoop for 10 minutes daily.      COMPLETED: Increase water intake   On track    Starting 03/30/2016, I will attempt to drink at least 8 oz water with each meal and snack daily.      Patient Stated       Pt wants to keep enjoying grandchildren       Depression Screen    04/03/2023    3:18 PM 11/20/2022    3:22 PM 08/17/2022    3:06 PM 07/20/2022    3:26 PM 04/06/2022    2:28 PM 11/17/2021    3:42 PM 11/16/2020    4:50 PM  PHQ 2/9  Scores  PHQ - 2 Score 0 0 1 2 0 0 0  PHQ- 9 Score  4 1 5 2  0 1    Fall Risk    03/30/2023    8:27 PM 11/20/2022    3:22 PM 08/17/2022    3:06 PM 07/20/2022    3:26 PM 11/17/2021    3:45 PM  Fall Risk   Falls in the past  year? 0 0 0 0 1  Number falls in past yr: 0    0  Injury with Fall? 0    0  Risk for fall due to : No Fall Risks    History of fall(s)  Follow up Education provided;Falls prevention discussed    Falls prevention discussed    MEDICARE RISK AT HOME: Medicare Risk at Home Any stairs in or around the home?: Yes If so, are there any without handrails?: Yes Home free of loose throw rugs in walkways, pet beds, electrical cords, etc?: Yes Adequate lighting in your home to reduce risk of falls?: Yes Life alert?: No Use of a cane, walker or w/c?: No Grab bars in the bathroom?: No Shower chair or bench in shower?: No Elevated toilet seat or a handicapped toilet?: Yes  TIMED UP AND GO:  Was the test performed?  No    Cognitive Function:    04/17/2017    9:35 AM 03/30/2016   10:59 AM  MMSE - Mini Mental State Exam  Orientation to time 5 5  Orientation to Place 5 5  Registration 3 3  Attention/ Calculation 0 0  Recall 3 3  Language- name 2 objects 0 0  Language- repeat 1 1  Language- follow 3 step command 3 3  Language- read & follow direction 0 0  Write a sentence 0 0  Copy design 0 0  Total score 20 20        04/03/2023    3:21 PM 11/17/2021    3:47 PM  6CIT Screen  What Year? 0 points 0 points  What month? 0 points   What time? 0 points 0 points  Count back from 20 0 points 0 points  Months in reverse 0 points 0 points  Repeat phrase 0 points 0 points  Total Score 0 points     Immunizations Immunization History  Administered Date(s) Administered   Fluad Quad(high Dose 65+) 04/06/2022   Influenza Split 05/07/2013   Influenza Whole 01/30/2008   Influenza, High Dose Seasonal PF 01/17/2018, 03/03/2019   Influenza,inj,Quad PF,6+ Mos 01/13/2014, 03/18/2015, 01/18/2016, 03/09/2017   Influenza-Unspecified 01/28/2020   PFIZER(Purple Top)SARS-COV-2 Vaccination 06/12/2019, 07/22/2019, 04/01/2020   Pneumococcal Conjugate-13 03/18/2015   Pneumococcal Polysaccharide-23  03/30/2016   Td 02/04/2002, 06/08/2021   Tdap 02/15/2017    TDAP status: Up to date  Flu Vaccine status: Due, Education has been provided regarding the importance of this vaccine. Advised may receive this vaccine at local pharmacy or Health Dept. Aware to provide a copy of the vaccination record if obtained from local pharmacy or Health Dept. Verbalized acceptance and understanding.  Pneumococcal vaccine status: Up to date  Covid-19 vaccine status: Completed vaccines  Qualifies for Shingles Vaccine? Yes   Zostavax completed No   Shingrix Completed?: No.    Education has been provided regarding the importance of this vaccine. Patient has been advised to call insurance company to determine out of pocket expense if they have not yet received this vaccine. Advised may also receive vaccine at local pharmacy or Health Dept. Verbalized acceptance and  understanding.  Screening Tests Health Maintenance  Topic Date Due   COVID-19 Vaccine (4 - 2023-24 season) 04/19/2023 (Originally 12/31/2022)   Zoster Vaccines- Shingrix (1 of 2) 07/02/2023 (Originally 10/14/1999)   INFLUENZA VACCINE  07/30/2023 (Originally 11/30/2022)   MAMMOGRAM  12/21/2023   Medicare Annual Wellness (AWV)  04/02/2024   Colonoscopy  01/10/2027   DTaP/Tdap/Td (4 - Td or Tdap) 06/09/2031   Pneumonia Vaccine 49+ Years old  Completed   DEXA SCAN  Completed   Hepatitis C Screening  Completed   HPV VACCINES  Aged Out    Health Maintenance  There are no preventive care reminders to display for this patient.   Colorectal cancer screening: Type of screening: Colonoscopy. Completed 01/09/2022. Repeat every 5 years  Mammogram status: Completed 12/20/21. Repeat every year  Bone Density status: Completed 12/20/2021. Results reflect: Bone density results: OSTEOPENIA. Repeat every 3 years.  Lung Cancer Screening: (Low Dose CT Chest recommended if Age 70-80 years, 20 pack-year currently smoking OR have quit w/in 15years.) does not  qualify.   Lung Cancer Screening Referral: no  Additional Screening:  Hepatitis C Screening: does not qualify; Completed no  Vision Screening: Recommended annual ophthalmology exams for early detection of glaucoma and other disorders of the eye. Is the patient up to date with their annual eye exam?  Yes  Who is the provider or what is the name of the office in which the patient attends annual eye exams? Dr Inez Pilgrim If pt is not established with a provider, would they like to be referred to a provider to establish care? No .   Dental Screening: Recommended annual dental exams for proper oral hygiene  Diabetic Foot Exam: n/a  Community Resource Referral / Chronic Care Management: CRR required this visit?  No   CCM required this visit?  Appt scheduled with PCP    Plan:     I have personally reviewed and noted the following in the patient's chart:   Medical and social history Use of alcohol, tobacco or illicit drugs  Current medications and supplements including opioid prescriptions. Patient is not currently taking opioid prescriptions. Functional ability and status Nutritional status Physical activity Advanced directives List of other physicians Hospitalizations, surgeries, and ER visits in previous 12 months Vitals Screenings to include cognitive, depression, and falls Referrals and appointments  In addition, I have reviewed and discussed with patient certain preventive protocols, quality metrics, and best practice recommendations. A written personalized care plan for preventive services as well as general preventive health recommendations were provided to patient.     Sue Lush, LPN   01/06/1190   After Visit Summary: (MyChart) Due to this being a telephonic visit, the after visit summary with patients personalized plan was offered to patient via MyChart   Nurse Notes: The patient states she is doing well and has no concerns or questions at this time.  *Appt  made for PE with PCP

## 2023-04-26 ENCOUNTER — Telehealth: Payer: Self-pay | Admitting: *Deleted

## 2023-04-26 DIAGNOSIS — E7800 Pure hypercholesterolemia, unspecified: Secondary | ICD-10-CM

## 2023-04-26 DIAGNOSIS — E559 Vitamin D deficiency, unspecified: Secondary | ICD-10-CM

## 2023-04-26 DIAGNOSIS — R7309 Other abnormal glucose: Secondary | ICD-10-CM

## 2023-04-26 NOTE — Telephone Encounter (Signed)
-----   Message from Alvina Chou sent at 04/26/2023  2:58 PM EST ----- Regarding: Lab order for Tue, 1.7.25 Patient is scheduled for CPX labs, please order future labs, Thanks , Camelia Eng

## 2023-05-08 ENCOUNTER — Other Ambulatory Visit: Payer: PPO

## 2023-05-09 ENCOUNTER — Other Ambulatory Visit (INDEPENDENT_AMBULATORY_CARE_PROVIDER_SITE_OTHER): Payer: PPO

## 2023-05-09 DIAGNOSIS — R7309 Other abnormal glucose: Secondary | ICD-10-CM

## 2023-05-09 DIAGNOSIS — E559 Vitamin D deficiency, unspecified: Secondary | ICD-10-CM | POA: Diagnosis not present

## 2023-05-09 DIAGNOSIS — E7800 Pure hypercholesterolemia, unspecified: Secondary | ICD-10-CM

## 2023-05-10 LAB — COMPREHENSIVE METABOLIC PANEL
ALT: 9 U/L (ref 0–35)
AST: 16 U/L (ref 0–37)
Albumin: 4.2 g/dL (ref 3.5–5.2)
Alkaline Phosphatase: 77 U/L (ref 39–117)
BUN: 12 mg/dL (ref 6–23)
CO2: 28 meq/L (ref 19–32)
Calcium: 9.6 mg/dL (ref 8.4–10.5)
Chloride: 104 meq/L (ref 96–112)
Creatinine, Ser: 1.32 mg/dL — ABNORMAL HIGH (ref 0.40–1.20)
GFR: 40.04 mL/min — ABNORMAL LOW (ref >60.00)
Glucose, Bld: 66 mg/dL — ABNORMAL LOW (ref 70–99)
Potassium: 4.1 meq/L (ref 3.5–5.1)
Sodium: 139 meq/L (ref 135–145)
Total Bilirubin: 0.4 mg/dL (ref 0.2–1.2)
Total Protein: 6.3 g/dL (ref 6.0–8.3)

## 2023-05-10 LAB — VITAMIN D 25 HYDROXY (VIT D DEFICIENCY, FRACTURES): VITD: 25.91 ng/mL — ABNORMAL LOW (ref 30.00–100.00)

## 2023-05-10 LAB — LIPID PANEL
Cholesterol: 172 mg/dL (ref 0–200)
HDL: 56.8 mg/dL (ref >39.00)
LDL Cholesterol: 92 mg/dL (ref 0–99)
NonHDL: 115.5
Total CHOL/HDL Ratio: 3
Triglycerides: 117 mg/dL (ref 0.0–149.0)
VLDL: 23.4 mg/dL (ref 0.0–40.0)

## 2023-05-10 LAB — HEMOGLOBIN A1C: Hgb A1c MFr Bld: 6 % (ref 4.6–6.5)

## 2023-05-10 NOTE — Progress Notes (Signed)
 No critical labs need to be addressed urgently. We will discuss labs in detail at upcoming office visit.

## 2023-05-13 ENCOUNTER — Other Ambulatory Visit: Payer: Self-pay | Admitting: Family Medicine

## 2023-05-15 ENCOUNTER — Encounter: Payer: Self-pay | Admitting: Family Medicine

## 2023-05-15 ENCOUNTER — Ambulatory Visit (INDEPENDENT_AMBULATORY_CARE_PROVIDER_SITE_OTHER): Payer: PPO | Admitting: Family Medicine

## 2023-05-15 VITALS — BP 112/80 | HR 62 | Temp 97.8°F | Ht 62.75 in | Wt 170.2 lb

## 2023-05-15 DIAGNOSIS — E7800 Pure hypercholesterolemia, unspecified: Secondary | ICD-10-CM

## 2023-05-15 DIAGNOSIS — F331 Major depressive disorder, recurrent, moderate: Secondary | ICD-10-CM

## 2023-05-15 DIAGNOSIS — E559 Vitamin D deficiency, unspecified: Secondary | ICD-10-CM

## 2023-05-15 DIAGNOSIS — Z0000 Encounter for general adult medical examination without abnormal findings: Secondary | ICD-10-CM | POA: Diagnosis not present

## 2023-05-15 DIAGNOSIS — N1832 Chronic kidney disease, stage 3b: Secondary | ICD-10-CM

## 2023-05-15 DIAGNOSIS — R7309 Other abnormal glucose: Secondary | ICD-10-CM

## 2023-05-15 DIAGNOSIS — Z23 Encounter for immunization: Secondary | ICD-10-CM

## 2023-05-15 NOTE — Progress Notes (Signed)
 Patient ID: Rebekah Johnston, female    DOB: 02-07-1951, 73 y.o.   MRN: [id number]  This visit was conducted in person.  BP 112/80 (BP Location: Left Arm, Patient Position: Sitting, Cuff Size: Large)   Pulse 62   Temp 97.8 F (36.6 C) (Temporal)   Ht 5' 2.75 (1.594 m)   Wt 170 lb 4 oz (77.2 kg)   SpO2 97%   BMI 30.40 kg/m    CC:  Chief Complaint  Patient presents with   Annual Exam    Part 2 ([facility] 04/03/23)    Subjective:   HPI: Rebekah Johnston is a 73 y.o. female presenting on 05/15/2023 for Annual Exam (Part 2 ([facility] 04/03/23))  The patient presents for  complete physical and review of chronic health problems. He/She also has the following acute concerns today: none  The patient saw a LPN or RN for medicare wellness visit. 04/03/23  Prevention and wellness was reviewed in detail. Note reviewed and important notes copied below.  CKD, stable GFR 40 -45   moderate water , no NSAIDs.  No family history of kidney issues.  BP Readings from Last 3 Encounters:  05/15/23 112/80  01/17/23 126/74  01/03/23 123/85   Not checking BP at home   Elevated Cholesterol: Slightly worse on rosuvastatin 10 mg Lab Results  Component Value Date   CHOL 172 05/09/2023   HDL 56.80 05/09/2023   LDLCALC 92 05/09/2023   LDLDIRECT 179.3 06/20/2013   TRIG 117.0 05/09/2023   CHOLHDL 3 05/09/2023  Using medications without problems:none Muscle aches: none Diet compliance: moderate Exercise:  off and on walking, moving a lot at work. Other complaints: Wt Readings from Last 3 Encounters:  05/15/23 170 lb 4 oz (77.2 kg)  04/03/23 170 lb (77.1 kg)  01/17/23 177 lb (80.3 kg)    MDD .  Chronic, stable control on Celexa  40 mg daily and bupropion  150 mg XL p.o. daily  Has not yet set up counselor. Flowsheet Row Office Visit from 05/15/2023 in [facility]  PHQ-2 Total Score 0        Prediabetes, stable control with diet  Lab Results  Component Value  Date   HGBA1C 6.0 05/09/2023     Vit D def:  previously controlled.      Relevant past medical, surgical, family and social history reviewed and updated as indicated. Interim medical history since our last visit reviewed. Allergies and medications reviewed and updated. Outpatient Medications Prior to Visit  Medication Sig Dispense Refill   acetaminophen  (TYLENOL ) 325 MG tablet Take 650 mg by mouth every 6 (six) hours as needed.     buPROPion  (WELLBUTRIN  XL) 150 MG 24 hr tablet TAKE 1 TABLET BY MOUTH EVERY DAY 90 tablet 0   citalopram  (CELEXA ) 40 MG tablet TAKE 1 TABLET BY MOUTH EVERY DAY 90 tablet 0   diclofenac  Sodium (VOLTAREN ) 1 % GEL as needed.     nystatin  cream (MYCOSTATIN ) Apply 1 Application topically as needed.     pantoprazole  (PROTONIX ) 40 MG tablet TAKE 1 TABLET BY MOUTH EVERY DAY IN THE MORNING 90 tablet 0   rosuvastatin (CRESTOR) 5 MG tablet TAKE 1 TABLET BY MOUTH EVERY DAY 90 tablet 1   ipratropium (ATROVENT ) 0.06 % nasal spray Place 2 sprays into both nostrils 4 (four) times daily. (Patient not taking: Reported on 12/27/2022) 15 mL 12   No facility-administered medications prior to visit.     Per HPI unless specifically indicated in  ROS section below Review of Systems  Constitutional:  Negative for fatigue and fever.  HENT:  Negative for congestion.   Eyes:  Negative for pain.  Respiratory:  Negative for cough and shortness of breath.   Cardiovascular:  Negative for chest pain, palpitations and leg swelling.  Gastrointestinal:  Negative for abdominal pain.  Genitourinary:  Negative for dysuria and vaginal bleeding.  Musculoskeletal:  Negative for back pain.  Neurological:  Negative for syncope, light-headedness and headaches.  Psychiatric/Behavioral:  Negative for dysphoric mood.    Objective:  BP 112/80 (BP Location: Left Arm, Patient Position: Sitting, Cuff Size: Large)   Pulse 62   Temp 97.8 F (36.6 C) (Temporal)   Ht 5' 2.75 (1.594 m)   Wt 170 lb 4 oz  (77.2 kg)   SpO2 97%   BMI 30.40 kg/m   Wt Readings from Last 3 Encounters:  05/15/23 170 lb 4 oz (77.2 kg)  04/03/23 170 lb (77.1 kg)  01/17/23 177 lb (80.3 kg)      Physical Exam Vitals and nursing note reviewed.  Constitutional:      General: She is not in acute distress.    Appearance: Normal appearance. She is well-developed. She is not ill-appearing or toxic-appearing.  HENT:     Head: Normocephalic.     Right Ear: Hearing, tympanic membrane, ear canal and external ear normal.     Left Ear: Hearing, tympanic membrane, ear canal and external ear normal.     Nose: Nose normal.  Eyes:     General: Lids are normal. Lids are everted, no foreign bodies appreciated.     Conjunctiva/sclera: Conjunctivae normal.     Pupils: Pupils are equal, round, and reactive to light.  Neck:     Thyroid : No thyroid  mass or thyromegaly.     Vascular: No carotid bruit.     Trachea: Trachea normal.  Cardiovascular:     Rate and Rhythm: Normal rate and regular rhythm.     Heart sounds: Normal heart sounds, S1 normal and S2 normal. No murmur heard.    No gallop.  Pulmonary:     Effort: Pulmonary effort is normal. No respiratory distress.     Breath sounds: Normal breath sounds. No wheezing, rhonchi or rales.  Abdominal:     General: Bowel sounds are normal. There is no distension or abdominal bruit.     Palpations: Abdomen is soft. There is no fluid wave or mass.     Tenderness: There is no abdominal tenderness. There is no guarding or rebound.     Hernia: No hernia is present.  Musculoskeletal:     Cervical back: Normal range of motion and neck supple.  Lymphadenopathy:     Cervical: No cervical adenopathy.  Skin:    General: Skin is warm and dry.     Findings: No rash.  Neurological:     Mental Status: She is alert.     Cranial Nerves: No cranial nerve deficit.     Sensory: No sensory deficit.  Psychiatric:        Mood and Affect: Mood is not anxious or depressed.        Speech:  Speech normal.        Behavior: Behavior normal. Behavior is cooperative.        Judgment: Judgment normal.       Results for orders placed or performed in visit on 05/09/23  VITAMIN D  25 Hydroxy (Vit-D Deficiency, Fractures)   Collection Time: 05/09/23  3:01 PM  Result Value Ref Range   VITD 25.91 (L) 30.00 - 100.00 ng/mL  Lipid panel   Collection Time: 05/09/23  3:01 PM  Result Value Ref Range   Cholesterol 172 0 - 200 mg/dL   Triglycerides 882.9 0.0 - 149.0 mg/dL   HDL 43.19 >60.99 mg/dL   VLDL 76.5 0.0 - 59.9 mg/dL   LDL Cholesterol 92 0 - 99 mg/dL   Total CHOL/HDL Ratio 3    NonHDL 115.50   Hemoglobin A1c   Collection Time: 05/09/23  3:01 PM  Result Value Ref Range   Hgb A1c MFr Bld 6.0 4.6 - 6.5 %  Comprehensive metabolic panel   Collection Time: 05/09/23  3:01 PM  Result Value Ref Range   Sodium 139 135 - 145 mEq/L   Potassium 4.1 3.5 - 5.1 mEq/L   Chloride 104 96 - 112 mEq/L   CO2 28 19 - 32 mEq/L   Glucose, Bld 66 (L) 70 - 99 mg/dL   BUN 12 6 - 23 mg/dL   Creatinine, Ser 8.67 (H) 0.40 - 1.20 mg/dL   Total Bilirubin 0.4 0.2 - 1.2 mg/dL   Alkaline Phosphatase 77 39 - 117 U/L   AST 16 0 - 37 U/L   ALT 9 0 - 35 U/L   Total Protein 6.3 6.0 - 8.3 g/dL   Albumin 4.2 3.5 - 5.2 g/dL   GFR 59.95 (L) >39.99 mL/min   Calcium  9.6 8.4 - 10.5 mg/dL     COVID 19 screen:  No recent travel or known exposure to COVID19 The patient denies respiratory symptoms of COVID 19 at this time. The importance of social distancing was discussed today.   Assessment and Plan The patient's preventative maintenance and recommended screening tests for an annual wellness exam were reviewed in full today. Brought up to date unless services declined.  Counselled on the importance of diet, exercise, and its role in overall health and mortality. The patient's FH and SH was reviewed, including their home life, tobacco status, and drug and alcohol status.   Vaccines:uptodate, except for  Shingrix. Pap/DVE:  nml pap, neg HPV 2015, no further indicated,  Continue DVE 1-2 years. Mammo 12/20/2021 plan q2 years Bone Density osteopenia 12/20/2021 repeat in 5 years  colon: 01/09/2022, repeat in 5 years per Dr.  Jinny Smoking Status: nonsmoker  Hep C: done    Problem List Items Addressed This Visit     Chronic kidney disease (CKD) stage G3b/A1, moderately decreased glomerular filtration rate (GFR) between 30-44 mL/min/1.73 square meter and albuminuria creatinine ratio less than 30 mg/g (HCC)   Chronic, fluctuating control buyt in last 3 years decline  Will refer to nephrology.  Continue to keep up with liquids and avoid NSAIDs.      Relevant Orders   Ambulatory referral to Nephrology   HYPERCHOLESTEROLEMIA   Chronic,  tolerable Crestor 10 mg p.o. daily.  LDL goal at goal less than 100      Major depressive disorder, recurrent episode, moderate (HCC)   Chronic, significant improvement with addition of bupropion  150 mg XL adjunct to Celexa  40 mg p.o. daily.  Now seeing counselor      PREDIABETES   Chronic, stable control      Vitamin D  deficiency   Resolved with supplementation in past, now low again.  Will restart VIt D 3 2000 international  international units daily.      Other Visit Diagnoses       Routine general medical examination at a health care facility    -  Primary     Need for influenza vaccination       Relevant Orders   Flu Vaccine Trivalent High Dose (Fluad) (Completed)         Greig Ring, MD

## 2023-05-15 NOTE — Assessment & Plan Note (Signed)
 Chronic, significant improvement with addition of bupropion 150 mg XL adjunct to Celexa 40 mg p.o. daily.  Now seeing counselor

## 2023-05-15 NOTE — Assessment & Plan Note (Addendum)
 Chronic, fluctuating control buyt in last 3 years decline  Will refer to nephrology.  Continue to keep up with liquids and avoid NSAIDs.

## 2023-05-15 NOTE — Assessment & Plan Note (Addendum)
 Resolved with supplementation in past, now low again.  Will restart VIt D 3 2000 international  international units daily.

## 2023-05-15 NOTE — Assessment & Plan Note (Signed)
Chronic, stable control 

## 2023-05-15 NOTE — Assessment & Plan Note (Signed)
 Chronic,  tolerable Crestor 10 mg p.o. daily.  LDL goal at goal less than 100

## 2023-06-05 ENCOUNTER — Encounter: Payer: Self-pay | Admitting: *Deleted

## 2023-06-18 ENCOUNTER — Other Ambulatory Visit: Payer: Self-pay | Admitting: Family Medicine

## 2023-06-18 DIAGNOSIS — R0602 Shortness of breath: Secondary | ICD-10-CM

## 2023-06-18 NOTE — Telephone Encounter (Signed)
 Last office visit 05/15/23 for CPE.  Last refilled 10/23/2022 for #90 with 1 refill.  Med list and Refill request is for Crestor 5 mg.  Office note states stable on Crestor 10 mg daily.  Please advise.

## 2023-07-11 ENCOUNTER — Other Ambulatory Visit: Payer: Self-pay | Admitting: Nephrology

## 2023-07-11 DIAGNOSIS — N1832 Chronic kidney disease, stage 3b: Secondary | ICD-10-CM | POA: Diagnosis not present

## 2023-07-25 ENCOUNTER — Telehealth: Payer: Self-pay | Admitting: Family Medicine

## 2023-07-25 ENCOUNTER — Encounter: Payer: Self-pay | Admitting: *Deleted

## 2023-07-25 DIAGNOSIS — L84 Corns and callosities: Secondary | ICD-10-CM

## 2023-07-25 DIAGNOSIS — R7309 Other abnormal glucose: Secondary | ICD-10-CM

## 2023-07-25 NOTE — Telephone Encounter (Signed)
 Copied from CRM [id number]. Topic: Referral - Request for Referral >> Jul 25, 2023  1:22 PM Florestine Avers wrote: Did the patient discuss referral with their provider in the last year? No (If No - schedule appointment) (If Yes - send message)  Appointment offered? No  Type of order/referral and detailed reason for visit: Podiatry Referral.  Patient states her little toe is going underneath her big toe making it very uncomfortable to wear shoes and its starting to form a callus.   Preference of office, provider, location: Unsure, open for a recommendation on where to go and who to see.  If referral order, have you been seen by this specialty before? No (If Yes, this issue or another issue? When? Where?  Can we respond through MyChart? Yes

## 2023-07-25 NOTE — Addendum Note (Signed)
 Addended by: Kerby Nora E on: 07/25/2023 04:58 PM   Modules accepted: Orders

## 2023-07-25 NOTE — Telephone Encounter (Signed)
 Referral placed.

## 2023-08-02 ENCOUNTER — Ambulatory Visit
Admission: RE | Admit: 2023-08-02 | Discharge: 2023-08-02 | Disposition: A | Discharge status: Home / Self Care | Source: Ambulatory Visit | Attending: Nephrology | Admitting: Nephrology

## 2023-08-02 DIAGNOSIS — N1832 Chronic kidney disease, stage 3b: Secondary | ICD-10-CM | POA: Insufficient documentation

## 2023-08-02 DIAGNOSIS — N183 Chronic kidney disease, stage 3 unspecified: Secondary | ICD-10-CM | POA: Diagnosis not present

## 2023-08-07 DIAGNOSIS — Z961 Presence of intraocular lens: Secondary | ICD-10-CM | POA: Diagnosis not present

## 2023-08-07 DIAGNOSIS — H353132 Nonexudative age-related macular degeneration, bilateral, intermediate dry stage: Secondary | ICD-10-CM | POA: Diagnosis not present

## 2023-08-07 DIAGNOSIS — D3131 Benign neoplasm of right choroid: Secondary | ICD-10-CM | POA: Diagnosis not present

## 2023-08-09 ENCOUNTER — Other Ambulatory Visit: Payer: Self-pay | Admitting: Family Medicine

## 2023-08-21 ENCOUNTER — Ambulatory Visit (INDEPENDENT_AMBULATORY_CARE_PROVIDER_SITE_OTHER): Admitting: Podiatry

## 2023-08-21 DIAGNOSIS — Q666 Other congenital valgus deformities of feet: Secondary | ICD-10-CM

## 2023-08-21 NOTE — Progress Notes (Signed)
 Subjective:  Patient ID: Rebekah Johnston, female    DOB: Sep 14, 1950,  MRN: [id number]  Chief Complaint  Patient presents with   Toe Pain    73 y.o. female presents with the above complaint.  Patient presents with complaint of bilateral arch and flatfoot deformity pain.  She states that she stands a lot on her feet.  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  She went to get it evaluated.  She denies seeing anyone as prior to seeing me denies any other acute complaints she does not wear any orthotics she wears a nonsupportive shoes.   Review of Systems: Negative except as noted in the HPI. Denies N/V/F/Ch.  Past Medical History:  Diagnosis Date   Asthma    no inhalers   Bronchitis    taking meds now   Chronic kidney disease 2008   Stage G3b/A1   COVID-19 11/20/2022   Resolved   Depression    GERD (gastroesophageal reflux disease)    Glaucoma    no eye drops   Hyperlipidemia    no meds   Macular degeneration    Bil   Osteoarthritis    Osteopenia    Sleep apnea    No CPAP    Current Outpatient Medications:    acetaminophen  (TYLENOL ) 325 MG tablet, Take 650 mg by mouth every 6 (six) hours as needed., Disp: , Rfl:    buPROPion  (WELLBUTRIN  XL) 150 MG 24 hr tablet, TAKE 1 TABLET BY MOUTH EVERY DAY, Disp: 90 tablet, Rfl: 1   citalopram  (CELEXA ) 40 MG tablet, TAKE 1 TABLET BY MOUTH EVERY DAY, Disp: 90 tablet, Rfl: 1   diclofenac  Sodium (VOLTAREN ) 1 % GEL, as needed., Disp: , Rfl:    nystatin  cream (MYCOSTATIN ), Apply 1 Application topically as needed., Disp: , Rfl:    pantoprazole  (PROTONIX ) 40 MG tablet, TAKE 1 TABLET BY MOUTH EVERY DAY IN THE MORNING, Disp: 90 tablet, Rfl: 0   rosuvastatin (CRESTOR) 5 MG tablet, TAKE 1 TABLET BY MOUTH EVERY DAY, Disp: 90 tablet, Rfl: 1  Social History   Tobacco Use  Smoking Status Former   Current packs/day: 0.00   Average packs/day: 0.3 packs/day for 3.0 years (0.8 ttl pk-yrs)   Types: Cigarettes   Start  date: 05/23/1998   Quit date: 05/23/2001   Years since quitting: 22.2  Smokeless Tobacco Never  Tobacco Comments   smoked socially not everyday thing.    No Known Allergies Objective:  There were no vitals filed for this visit. There is no height or weight on file to calculate BMI. Constitutional Well developed. Well nourished.  Vascular Dorsalis pedis pulses palpable bilaterally. Posterior tibial pulses palpable bilaterally. Capillary refill normal to all digits.  No cyanosis or clubbing noted. Pedal hair growth normal.  Neurologic Normal speech. Oriented to person, place, and time. Epicritic sensation to light touch grossly present bilaterally.  Dermatologic Nails well groomed and normal in appearance. No open wounds. No skin lesions.  Orthopedic: Gait examination shows pes planovalgus with calcaneovalgus to many toe signs partially laboring.  The arch with dorsiflexion of the hallux unable to perform single and double heel raise   Radiographs: None Assessment:   1. Pes planovalgus    Plan:  Patient was evaluated and treated and all questions answered.  Pes planovalgus -I explained to patient the etiology of pes planovalgus and relationship with Planter fasciitis and various treatment options were discussed.  Given patient foot structure in the setting of  Planter fasciitis I believe patient will benefit from custom-made orthotics to help control the hindfoot motion support the arch of the foot and take the stress away from plantar fascial.  Patient agrees with the plan like to proceed with orthotics -Patient was casted for orthotics  -  No follow-ups on file.

## 2023-08-22 DIAGNOSIS — N1832 Chronic kidney disease, stage 3b: Secondary | ICD-10-CM | POA: Diagnosis not present

## 2023-08-25 DIAGNOSIS — N1832 Chronic kidney disease, stage 3b: Secondary | ICD-10-CM | POA: Diagnosis not present

## 2023-09-06 DIAGNOSIS — D485 Neoplasm of uncertain behavior of skin: Secondary | ICD-10-CM | POA: Diagnosis not present

## 2023-09-06 DIAGNOSIS — L570 Actinic keratosis: Secondary | ICD-10-CM | POA: Diagnosis not present

## 2023-09-06 DIAGNOSIS — C441192 Basal cell carcinoma of skin of left lower eyelid, including canthus: Secondary | ICD-10-CM | POA: Diagnosis not present

## 2023-09-28 ENCOUNTER — Telehealth: Payer: Self-pay

## 2023-09-28 NOTE — Telephone Encounter (Signed)
 LVM to schedule orthotic pick up

## 2023-10-11 DIAGNOSIS — C441191 Basal cell carcinoma of skin of left upper eyelid, including canthus: Secondary | ICD-10-CM | POA: Diagnosis not present

## 2023-10-11 DIAGNOSIS — C441192 Basal cell carcinoma of skin of left lower eyelid, including canthus: Secondary | ICD-10-CM | POA: Diagnosis not present

## 2023-10-25 NOTE — Telephone Encounter (Signed)
 Orthotics placed in [employer]

## 2023-10-27 ENCOUNTER — Other Ambulatory Visit: Payer: Self-pay | Admitting: Cardiovascular Disease

## 2023-10-27 DIAGNOSIS — R0789 Other chest pain: Secondary | ICD-10-CM

## 2023-11-23 ENCOUNTER — Ambulatory Visit

## 2023-11-23 NOTE — Progress Notes (Signed)
 Patient presents today to pick up custom molded foot orthotics, diagnosed with Pes Planus  by Dr. Allena Katz.   Orthotics were dispensed and fit was satisfactory. Reviewed instructions for break-in and wear. Written instructions given to patient.  Patient will follow up as needed.   Addison Bailey CPed, CFo, CFm

## 2023-12-26 ENCOUNTER — Ambulatory Visit: Payer: Self-pay

## 2023-12-26 NOTE — Telephone Encounter (Signed)
 Agree with recommendations to take vitamin on a full stomach and to hold vitamin C.

## 2023-12-26 NOTE — Telephone Encounter (Signed)
 FYI Only or Action Required?: FYI only for provider.  Patient was last seen in primary care on 05/15/2023 by Avelina Greig BRAVO, MD.  Called Nurse Triage reporting Abdominal Cramping.  Symptoms began several days ago.  Interventions attempted: Nothing.  Symptoms are: stable.  Triage Disposition: See PCP When Office is Open (Within 3 Days)  Patient/caregiver understands and will follow disposition?:      Copied from CRM #[id number]. Topic: Clinical - Red Word Triage >> Dec 26, 2023 11:05 AM Charolett L wrote: [facility] that prompted transfer to Nurse Triage: Patient stated that she is having abdominal cramps and wanted to know if It a result of the medication.  Patient stated she's had abdominal since 9am Reason for Disposition  [1] Few streaks of blood in vomit AND [2] occurred one time    No vomiting. Started new medication.  Answer Assessment - Initial Assessment Questions Pt stated her ophthalmologist put her on AREDS 2 last week, noticed abdominal cramping 2 days after starting. Stated she's been taking it on an empty stomach in the morning. Advised patient to eat something before taking medication, or skip dose all together to see if abdominal cramping subsides. Pt states she also takes an OTC vitamin C medication, due to high dose in AREDS, advised patient to stop taking the OTC vitamin C.    1. LOCATION: Where does it hurt?      Upper, above belly button, across whole abdomen   2. RADIATION: Does the pain shoot anywhere else? (e.g., chest, back)     States a few minutes ago creeping up chest  3. ONSET: When did the pain begin? (e.g., minutes, hours or days ago)      Started new medication last week, abdominal pain started 2nd day a few hours after taking medication  4. PATTERN Does the pain come and go, or is it constant?     Come and goes, but constant today starting at 9am.  5. SEVERITY: How bad is the pain?  (e.g., Scale 1-10; mild, moderate, or severe)      4/10  6. RECURRENT SYMPTOM: Have you ever had this type of stomach pain before? If Yes, ask: When was the last time? and What happened that time?      No 7. CAUSE: What do you think is causing the stomach pain? (e.g., gallstones, recent abdominal surgery)     New medication started last week, AREDS 2  8. RELIEVING/AGGRAVATING FACTORS: What makes it better or worse? (e.g., antacids, bending or twisting motion, bowel movement)     No  9. OTHER SYMPTOMS: Do you have any other symptoms? (e.g., back pain, diarrhea, fever, urination pain, vomiting)       Diarrhea, slight nausea  Protocols used: Abdominal Pain - Female-A-AH

## 2023-12-27 ENCOUNTER — Ambulatory Visit (INDEPENDENT_AMBULATORY_CARE_PROVIDER_SITE_OTHER): Admitting: Family Medicine

## 2023-12-27 ENCOUNTER — Encounter: Payer: Self-pay | Admitting: Family Medicine

## 2023-12-27 VITALS — BP 112/80 | HR 55 | Temp 98.0°F | Ht 62.75 in | Wt 171.0 lb

## 2023-12-27 DIAGNOSIS — R1013 Epigastric pain: Secondary | ICD-10-CM | POA: Insufficient documentation

## 2023-12-27 NOTE — Assessment & Plan Note (Signed)
 Acute, differential includes medication side effect ,gastritis versus pancreatitis versus referred pain from gallbladder or liver. She is starting to feel significantly better since stopping the eye vitamin.  She will hold off on this medication and repeat trial it once she is feeling better. Will check CBC, c-Met and lipase. Encouraged her to avoid acidic foods, continue pantoprazole  40 mg daily and to push water .  Return and ER precautions provided

## 2023-12-27 NOTE — Progress Notes (Signed)
 Patient ID: Rebekah Johnston, female    DOB: 04-29-1951, 73 y.o.   MRN: [id number]  This visit was conducted in person.  BP 112/80   Pulse (!) 55   Temp 98 F (36.7 C) (Temporal)   Ht 5' 2.75 (1.594 m)   Wt 171 lb (77.6 kg)   SpO2 96%   BMI 30.53 kg/m    CC:  Chief Complaint  Patient presents with   Abdominal Cramping    Started Areds 2 last week-Not sure it that is what causing it     Subjective:   HPI: Rebekah Johnston is a 73 y.o. female presenting on 12/27/2023 for Abdominal Cramping (Started Areds 2 last week-Not sure it that is what causing it/)   She reports abdominal cramping  in the last week.  New meds: started eye vitamin supplement 1 week ago. Upper abdominal pain, no fever. Feels like cramping, intermittent  Some nausea but no emesis.  Yesterday loose stool x 1 , no blood in stool.  Stop Areds2 yesterday... pain better now.  No flu like symptoms.   No BM today.    S/P cholecystectomy.  Has had some tomato sandwiches. NO GERD.  On pantoprazole  40 mg daily.   She did also have episode 2 weeks ago of N/V/D thought it was viral GE.  Relevant past medical, surgical, family and social history reviewed and updated as indicated. Interim medical history since our last visit reviewed. Allergies and medications reviewed and updated. Outpatient Medications Prior to Visit  Medication Sig Dispense Refill   acetaminophen  (TYLENOL ) 325 MG tablet Take 650 mg by mouth every 6 (six) hours as needed.     buPROPion  (WELLBUTRIN  XL) 150 MG 24 hr tablet TAKE 1 TABLET BY MOUTH EVERY DAY 90 tablet 1   citalopram  (CELEXA ) 40 MG tablet TAKE 1 TABLET BY MOUTH EVERY DAY 90 tablet 1   Multiple Vitamins-Minerals (SYSTANE ICAPS AREDS2 PO) Take 1 Capful by mouth 2 (two) times daily.     pantoprazole  (PROTONIX ) 40 MG tablet TAKE 1 TABLET BY MOUTH EVERY DAY IN THE MORNING 90 tablet 0   rosuvastatin (CRESTOR) 5 MG tablet TAKE 1 TABLET BY MOUTH EVERY DAY 90 tablet 1   diclofenac   Sodium (VOLTAREN ) 1 % GEL as needed.     nystatin  cream (MYCOSTATIN ) Apply 1 Application topically as needed.     No facility-administered medications prior to visit.     Per HPI unless specifically indicated in ROS section below Review of Systems  Constitutional:  Negative for fatigue and fever.  HENT:  Negative for congestion.   Eyes:  Negative for pain.  Respiratory:  Negative for cough and shortness of breath.   Cardiovascular:  Negative for chest pain, palpitations and leg swelling.  Gastrointestinal:  Positive for abdominal pain.  Genitourinary:  Negative for dysuria and vaginal bleeding.  Musculoskeletal:  Negative for back pain.  Neurological:  Negative for syncope, light-headedness and headaches.  Psychiatric/Behavioral:  Negative for dysphoric mood.    Objective:  BP 112/80   Pulse (!) 55   Temp 98 F (36.7 C) (Temporal)   Ht 5' 2.75 (1.594 m)   Wt 171 lb (77.6 kg)   SpO2 96%   BMI 30.53 kg/m   Wt Readings from Last 3 Encounters:  12/27/23 171 lb (77.6 kg)  05/15/23 170 lb 4 oz (77.2 kg)  04/03/23 170 lb (77.1 kg)      Physical Exam Constitutional:      General: She is not  in acute distress.    Appearance: Normal appearance. She is well-developed. She is not ill-appearing or toxic-appearing.  HENT:     Head: Normocephalic.     Right Ear: Hearing, tympanic membrane, ear canal and external ear normal. Tympanic membrane is not erythematous, retracted or bulging.     Left Ear: Hearing, tympanic membrane, ear canal and external ear normal. Tympanic membrane is not erythematous, retracted or bulging.     Nose: No mucosal edema or rhinorrhea.     Right Sinus: No maxillary sinus tenderness or frontal sinus tenderness.     Left Sinus: No maxillary sinus tenderness or frontal sinus tenderness.     Mouth/Throat:     Pharynx: Uvula midline.  Eyes:     General: Lids are normal. Lids are everted, no foreign bodies appreciated.     Conjunctiva/sclera: Conjunctivae  normal.     Pupils: Pupils are equal, round, and reactive to light.  Neck:     Thyroid : No thyroid  mass or thyromegaly.     Vascular: No carotid bruit.     Trachea: Trachea normal.  Cardiovascular:     Rate and Rhythm: Normal rate and regular rhythm.     Pulses: Normal pulses.     Heart sounds: Normal heart sounds, S1 normal and S2 normal. No murmur heard.    No friction rub. No gallop.  Pulmonary:     Effort: Pulmonary effort is normal. No tachypnea or respiratory distress.     Breath sounds: Normal breath sounds. No decreased breath sounds, wheezing, rhonchi or rales.  Abdominal:     General: Bowel sounds are normal. There is no distension.     Palpations: Abdomen is soft. There is no mass.     Tenderness: There is abdominal tenderness. There is no right CVA tenderness, left CVA tenderness, guarding or rebound.  Musculoskeletal:     Cervical back: Normal range of motion and neck supple.  Skin:    General: Skin is warm and dry.     Findings: No rash.  Neurological:     Mental Status: She is alert.  Psychiatric:        Mood and Affect: Mood is not anxious or depressed.        Speech: Speech normal.        Behavior: Behavior normal. Behavior is cooperative.        Thought Content: Thought content normal.        Judgment: Judgment normal.       Results for orders placed or performed in visit on 05/09/23  VITAMIN D  25 Hydroxy (Vit-D Deficiency, Fractures)   Collection Time: 05/09/23  3:01 PM  Result Value Ref Range   VITD 25.91 (L) 30.00 - 100.00 ng/mL  Lipid panel   Collection Time: 05/09/23  3:01 PM  Result Value Ref Range   Cholesterol 172 0 - 200 mg/dL   Triglycerides 882.9 0.0 - 149.0 mg/dL   HDL 43.19 >60.99 mg/dL   VLDL 76.5 0.0 - 59.9 mg/dL   LDL Cholesterol 92 0 - 99 mg/dL   Total CHOL/HDL Ratio 3    NonHDL 115.50   Hemoglobin A1c   Collection Time: 05/09/23  3:01 PM  Result Value Ref Range   Hgb A1c MFr Bld 6.0 4.6 - 6.5 %  Comprehensive metabolic panel    Collection Time: 05/09/23  3:01 PM  Result Value Ref Range   Sodium 139 135 - 145 mEq/L   Potassium 4.1 3.5 - 5.1 mEq/L   Chloride 104 96 -  112 mEq/L   CO2 28 19 - 32 mEq/L   Glucose, Bld 66 (L) 70 - 99 mg/dL   BUN 12 6 - 23 mg/dL   Creatinine, Ser 8.67 (H) 0.40 - 1.20 mg/dL   Total Bilirubin 0.4 0.2 - 1.2 mg/dL   Alkaline Phosphatase 77 39 - 117 U/L   AST 16 0 - 37 U/L   ALT 9 0 - 35 U/L   Total Protein 6.3 6.0 - 8.3 g/dL   Albumin 4.2 3.5 - 5.2 g/dL   GFR 59.95 (L) >39.99 mL/min   Calcium  9.6 8.4 - 10.5 mg/dL    Assessment and Plan  Epigastric abdominal pain Assessment & Plan: Acute, differential includes medication side effect ,gastritis versus pancreatitis versus referred pain from gallbladder or liver. She is starting to feel significantly better since stopping the eye vitamin.  She will hold off on this medication and repeat trial it once she is feeling better. Will check CBC, c-Met and lipase. Encouraged her to avoid acidic foods, continue pantoprazole  40 mg daily and to push water .  Return and ER precautions provided  Orders: -     CBC with Differential/Platelet -     Comprehensive metabolic panel with GFR -     Lipase    No follow-ups on file.   Greig Ring, MD

## 2023-12-28 ENCOUNTER — Ambulatory Visit: Payer: Self-pay | Admitting: Family Medicine

## 2023-12-28 LAB — CBC WITH DIFFERENTIAL/PLATELET
Basophils Absolute: 0 K/uL (ref 0.0–0.1)
Basophils Relative: 0.4 % (ref 0.0–3.0)
Eosinophils Absolute: 0.1 K/uL (ref 0.0–0.7)
Eosinophils Relative: 1.5 % (ref 0.0–5.0)
HCT: 39.2 % (ref 36.0–46.0)
Hemoglobin: 12.8 g/dL (ref 12.0–15.0)
Lymphocytes Relative: 27.9 % (ref 12.0–46.0)
Lymphs Abs: 1.9 K/uL (ref 0.7–4.0)
MCHC: 32.7 g/dL (ref 30.0–36.0)
MCV: 87.3 fl (ref 78.0–100.0)
Monocytes Absolute: 0.7 K/uL (ref 0.1–1.0)
Monocytes Relative: 9.8 % (ref 3.0–12.0)
Neutro Abs: 4.1 K/uL (ref 1.4–7.7)
Neutrophils Relative %: 60.4 % (ref 43.0–77.0)
Platelets: 263 K/uL (ref 150.0–400.0)
RBC: 4.49 Mil/uL (ref 3.87–5.11)
RDW: 12.9 % (ref 11.5–15.5)
WBC: 6.8 K/uL (ref 4.0–10.5)

## 2023-12-28 LAB — COMPREHENSIVE METABOLIC PANEL WITH GFR
ALT: 11 U/L (ref 0–35)
AST: 17 U/L (ref 0–37)
Albumin: 3.9 g/dL (ref 3.5–5.2)
Alkaline Phosphatase: 87 U/L (ref 39–117)
BUN: 14 mg/dL (ref 6–23)
CO2: 29 meq/L (ref 19–32)
Calcium: 9.2 mg/dL (ref 8.4–10.5)
Chloride: 104 meq/L (ref 96–112)
Creatinine, Ser: 1.29 mg/dL — ABNORMAL HIGH (ref 0.40–1.20)
GFR: 40.97 mL/min — ABNORMAL LOW (ref >60.00)
Glucose, Bld: 75 mg/dL (ref 70–99)
Potassium: 4.6 meq/L (ref 3.5–5.1)
Sodium: 142 meq/L (ref 135–145)
Total Bilirubin: 0.5 mg/dL (ref 0.2–1.2)
Total Protein: 6.3 g/dL (ref 6.0–8.3)

## 2023-12-28 LAB — LIPASE: Lipase: 38 U/L (ref 11.0–59.0)

## 2024-01-24 ENCOUNTER — Other Ambulatory Visit: Payer: Self-pay | Admitting: Family Medicine

## 2024-01-24 DIAGNOSIS — R0602 Shortness of breath: Secondary | ICD-10-CM

## 2024-01-25 ENCOUNTER — Other Ambulatory Visit: Payer: Self-pay | Admitting: Cardiovascular Disease

## 2024-01-25 DIAGNOSIS — R0789 Other chest pain: Secondary | ICD-10-CM

## 2024-02-07 DIAGNOSIS — Z961 Presence of intraocular lens: Secondary | ICD-10-CM | POA: Diagnosis not present

## 2024-02-07 DIAGNOSIS — D3131 Benign neoplasm of right choroid: Secondary | ICD-10-CM | POA: Diagnosis not present

## 2024-02-07 DIAGNOSIS — H353221 Exudative age-related macular degeneration, left eye, with active choroidal neovascularization: Secondary | ICD-10-CM | POA: Diagnosis not present

## 2024-02-12 DIAGNOSIS — H353221 Exudative age-related macular degeneration, left eye, with active choroidal neovascularization: Secondary | ICD-10-CM | POA: Diagnosis not present

## 2024-02-12 DIAGNOSIS — D3131 Benign neoplasm of right choroid: Secondary | ICD-10-CM | POA: Diagnosis not present

## 2024-02-12 DIAGNOSIS — Z961 Presence of intraocular lens: Secondary | ICD-10-CM | POA: Diagnosis not present

## 2024-03-10 DIAGNOSIS — H353221 Exudative age-related macular degeneration, left eye, with active choroidal neovascularization: Secondary | ICD-10-CM | POA: Diagnosis not present

## 2024-03-13 DIAGNOSIS — H353221 Exudative age-related macular degeneration, left eye, with active choroidal neovascularization: Secondary | ICD-10-CM | POA: Diagnosis not present

## 2024-03-29 ENCOUNTER — Other Ambulatory Visit: Payer: Self-pay | Admitting: Family Medicine

## 2024-04-03 ENCOUNTER — Ambulatory Visit: Payer: PPO

## 2024-04-03 NOTE — Progress Notes (Unsigned)
 I connected with  Rebekah Johnston on 04/03/24 by a {Video Enabled:26378::video and audio} enabled telemedicine application and verified that I am speaking with the correct person using two identifiers.  {Patient [phone]::Home}  {Provider Location:[phone]::Home Office}  Persons Participating in Visit: {Persons Participating in Visit:32444}  I discussed the limitations of evaluation and management by telemedicine. The patient expressed understanding and agreed to proceed.  Vital Signs: Because this visit was a virtual/telehealth visit, some criteria may be missing or patient reported. Any vitals not documented were not able to be obtained and vitals that have been documented are patient reported.

## 2024-04-11 ENCOUNTER — Ambulatory Visit

## 2024-04-26 ENCOUNTER — Other Ambulatory Visit: Payer: Self-pay | Admitting: Family Medicine

## 2024-04-26 DIAGNOSIS — R0602 Shortness of breath: Secondary | ICD-10-CM

## 2024-05-06 ENCOUNTER — Other Ambulatory Visit: Payer: Self-pay | Admitting: Cardiovascular Disease

## 2024-05-06 DIAGNOSIS — R0789 Other chest pain: Secondary | ICD-10-CM

## 2024-05-29 NOTE — ED Provider Notes (Signed)
 [facility] Emergency Department Provider Note     ED Clinical Impression    Final diagnoses:  Vasovagal episode  Lower abdominal pain (Primary)       Impression, ED Course, Assessment and Plan    Impression: Rebekah Johnston is a 74 y.o. female with PMH significant for CKD stage III, depression, HLD who presents to the emergency department via EMS for abdominal pain and a presyncopal episode.  Slightly hypertensive in triage, fatigued appearing however in no acute distress.   Ddx includes diverticulitis, mesenteric ischemia, appendicitis, nephrolithiasis, gastroenteritis viral vs foodborne, volvulus, less likely SBO, atypical ACS.  Question if she had vasovagal episode, could also consider cardiac arrhythmia, orthostasis. Hypoglycemia felt less likely as CBG on scene normal. Will obtain basic labs including lipase, lactate, trop, ua, 4plex, EKG, CTAP with contrast.  IVF provided, pt denies need for pain or nausea medication at this time.   2:38 PM Labs without any notable leukocytosis.  Creatinine 1.28, no previous on file however does report CKD stage III baseline.  Lactate elevated at 2.4, repeat after IVF normalized.  Troponin 3, EKG without any ischemic changes, lipase normal.  Repeat negative, UA without any overt signs of infection.  CT clear.  States she feels slightly improved at this time, will attempt p.o. challenge with a bland meal.  3:47 PM Has p.o. challenge without any difficulty, feels improved.  Color has returned to her face.  Plan to discharge with recommendation for supportive measures.  Bland diet, hydration and rest.  Zofran  sent which she can fill and use as needed.  Work note provided.  Strict reevaluation criteria were provided and patient verbalized understanding agreeable to discharge.   Additional Medical Decision Making   I have reviewed the vital signs and the nursing notes. Labs and radiology results that were available during my care of  the patient were independently reviewed by me and considered in my medical decision making.   I directly visualized and independently interpreted the EKG tracing.  I independently visualized the radiology images.  I reviewed the patient's prior medical records (meds, hx).   Portions of this record have been created using Scientist, clinical (histocompatibility and immunogenetics). Dictation errors have been sought, but may not have been identified and corrected. ____________________________________________      History     Chief Complaint Abdominal Pain   HPI  Rebekah Johnston is a 74 y.o. female with PMH significant for CKD stage III, depression, HLD who presents to the emergency department via EMS for abdominal pain and a presyncopal episode.  Per discussion, patient felt well this morning when she left for work.  She suddenly developed severe lower abdominal pain and tried to go to the restroom however was only able to urinate.  Felt nauseated and dizzy and subsequently went back to the bathroom as her abdominal pain worsened however the dizziness worsened and she felt weak as if she was going to pass out and lowered herself to the ground so she would not fall. She was so weak she was unable to get up so EMS was called for transport to ER.  CBG 149, EKG en route without any ischemic changes.  No fevers, cp, sob, vomiting, dysuria, constipation or diarrhea, melena or hematochezia, URI symptoms.  No recent dietary changes.  Only previous abdominal surgery - gallbladder.  Denies any recreational drug or alcohol use.   Past Medical History[1]  There is no problem list on file for this patient.[2]  Past Surgical History[3]  No  current facility-administered medications for this encounter.  Current Outpatient Medications:    ondansetron  (ZOFRAN -ODT) 4 MG disintegrating tablet, Take 1 tablet (4 mg total) by mouth every eight (8) hours as needed for nausea for up to 7 days., Disp: 14 tablet, Rfl: 0  Allergies Patient has  no known allergies.  Family History[4]  Social History Short Social History[5]    Physical Exam   This provider entered the patient's room: Yes:  If this provider did not enter the room, a comprehensive physical exam was not able to be performed due to increased infection risk to themselves, other providers, staff and other patients), as well as to conserve personal protective equipment (PPE) utilization during the COVID-19 pandemic.  If this provider did enter the patient room, the following was PPE worn: Surgical mask, eye protection and gloves  ED Triage Vitals [05/29/24 1058]  Enc Vitals Group     BP 154/94     Pulse 54     SpO2 Pulse      Resp 18     Temp 36.3C     Temp Source Oral     SpO2 99%     Weight 75.7 kg (166 lb 14.2 oz)   Constitutional: Alert and oriented. Fatigued appearing however in no acute distress.  Eyes: Conjunctivae are normal. ENT      Head: Normocephalic and atraumatic.      Nose: No congestion.      Mouth/Throat: Mucous membranes are moist.      Neck: No stridor. Cardiovascular: Normal rate, regular rhythm. No murmurs, rubs or gallops.  No significant LE edema noted.  Respiratory: Normal respiratory effort. Breath sounds are normal in all lobes. Gastrointestinal: Soft and nondistended. Audible bowel sounds in all four quads.  There is TTP diffusely to lower abdomen, no rebound or guarding.  Neurologic: Normal speech and language. No gross focal neurologic deficits are appreciated.  GCS 15. Skin: Skin is warm, dry and intact. No rash noted.  Pallor noted. Psychiatric: Mood and affect are normal. Speech and behavior are normal.   EKG   Bradycardic rate, regular rhythm, 57bpm No axis deviation Normal intervals No significant ST elevation or depression    Radiology   CT Abdomen Pelvis W IV Contrast Only  Final Result  1.  No acute findings within the abdomen or pelvis. Specifically, there is no evidence for diverticulitis.           Procedures   N/a         [1] No past medical history on file. [2] There is no problem list on file for this patient.  [3] No past surgical history on file. [4] No family history on file. [5]    Gearline Woodroe Browning, FNP 05/29/24 1550

## 2024-06-05 ENCOUNTER — Ambulatory Visit: Admitting: Cardiovascular Disease

## 2024-06-05 ENCOUNTER — Encounter: Payer: Self-pay | Admitting: Cardiovascular Disease

## 2024-06-05 VITALS — BP 130/78 | HR 63 | Ht 62.0 in | Wt 159.0 lb

## 2024-06-05 DIAGNOSIS — R55 Syncope and collapse: Secondary | ICD-10-CM

## 2024-06-05 DIAGNOSIS — R7309 Other abnormal glucose: Secondary | ICD-10-CM | POA: Diagnosis not present

## 2024-06-05 DIAGNOSIS — E7800 Pure hypercholesterolemia, unspecified: Secondary | ICD-10-CM

## 2024-06-05 DIAGNOSIS — R0789 Other chest pain: Secondary | ICD-10-CM | POA: Diagnosis not present

## 2024-06-05 DIAGNOSIS — R0602 Shortness of breath: Secondary | ICD-10-CM

## 2024-06-05 MED ORDER — PANTOPRAZOLE SODIUM 40 MG PO TBEC: 40.0000 mg | DELAYED_RELEASE_TABLET | Freq: Every day | ORAL | 0 refills | Status: AC

## 2024-06-05 NOTE — Progress Notes (Signed)
 "     Cardiology Office Note   Date:  06/05/2024   ID:  Sherlene, Rickel 13-Jul-1950, MRN [id number]  PCP:  Avelina Greig BRAVO, MD  Cardiologist:  Denyse Bathe, MD      History of Present Illness: Rebekah Johnston is a 74 y.o. female who presents for  Chief Complaint  Patient presents with   Follow-up    Follow up  Has some questions about pantoprazole  / she had to go to the ER last week doctor suggested that she may should stop the pantoprazole . If she stays on it she will need a refill.  154/94 was her BP when she arrived at the ER. She said that Ct ,EKG, and blood work all came back fine      73 YOWF,Has SOB on exertion, occasional chest pains. Seen in ER with pre-syncope and also abdominal pains last Thursday.      Past Medical History:  Diagnosis Date   Asthma    no inhalers   Bronchitis    taking meds now   Chronic kidney disease 2008   Stage G3b/A1   COVID-19 11/20/2022   Resolved   Depression    GERD (gastroesophageal reflux disease)    Glaucoma    no eye drops   Hyperlipidemia    no meds   Macular degeneration    Bil   Osteoarthritis    Osteopenia    Sleep apnea    No CPAP     Past Surgical History:  Procedure Laterality Date   BUNIONECTOMY     rt. foot   CATARACT EXTRACTION W/PHACO Left 01/03/2023   Procedure: CATARACT EXTRACTION PHACO AND INTRAOCULAR LENS PLACEMENT (IOC) LEFT 4.51;  Surgeon: Mittie Gaskin, MD;  Location: [facility];  Service: Ophthalmology;  Laterality: Left;   CATARACT EXTRACTION W/PHACO Right 01/17/2023   Procedure: CATARACT EXTRACTION PHACO AND INTRAOCULAR LENS PLACEMENT (IOC) RIGHT  6.51  00:53.3;  Surgeon: Mittie Gaskin, MD;  Location: [facility];  Service: Ophthalmology;  Laterality: Right;   CHOLECYSTECTOMY     COLONOSCOPY WITH PROPOFOL  N/A 01/09/2022   Procedure: COLONOSCOPY POLYPECTOMY WITH PROPOFOL  CLIP APPLIED AT CECAL POLYP SITE;  Surgeon: Jinny Carmine, MD;  Location: [facility];   Service: Endoscopy;  Laterality: N/A;  sleep apnea   HERNIA REPAIR     left inguinal hernia   TONSILLECTOMY  1967     Current Outpatient Medications  Medication Sig Dispense Refill   acetaminophen  (TYLENOL ) 325 MG tablet Take 650 mg by mouth every 6 (six) hours as needed.     buPROPion  (WELLBUTRIN  XL) 150 MG 24 hr tablet TAKE 1 TABLET BY MOUTH EVERY DAY 90 tablet 0   citalopram  (CELEXA ) 40 MG tablet TAKE 1 TABLET BY MOUTH EVERY DAY 90 tablet 1   Multiple Vitamins-Minerals (SYSTANE ICAPS AREDS2 PO) Take 1 Capful by mouth 2 (two) times daily.     rosuvastatin (CRESTOR) 5 MG tablet TAKE 1 TABLET BY MOUTH EVERY DAY 90 tablet 0   pantoprazole  (PROTONIX ) 40 MG tablet Take 1 tablet (40 mg total) by mouth daily. 90 tablet 0   No current facility-administered medications for this visit.    Allergies:   Patient has no known allergies.    Social History:   reports that she quit smoking about 23 years ago. Her smoking use included cigarettes. She started smoking about 26 years ago. She has a 0.8 pack-year smoking history. She has never used smokeless tobacco. She reports current alcohol use of about  1.0 standard drink of alcohol per week. She reports that she does not use drugs.   Family History:  family history includes Dementia in her mother; Diabetes in her brother; Melanoma in an other family member; Prostate cancer in her father.    ROS:     Review of Systems  Constitutional: Negative.   HENT: Negative.    Eyes: Negative.   Respiratory: Negative.    Gastrointestinal: Negative.   Genitourinary: Negative.   Musculoskeletal: Negative.   Skin: Negative.   Neurological: Negative.   Endo/Heme/Allergies: Negative.   Psychiatric/Behavioral: Negative.    All other systems reviewed and are negative.     All other systems are reviewed and negative.    PHYSICAL EXAM: VS:  BP 130/78   Pulse 63   Ht 5' 2 (1.575 m)   Wt 159 lb (72.1 kg)   SpO2 97%   BMI 29.08 kg/m  , BMI Body mass  index is 29.08 kg/m. Last weight:  Wt Readings from Last 3 Encounters:  06/05/24 159 lb (72.1 kg)  04/03/24 171 lb (77.6 kg)  12/27/23 171 lb (77.6 kg)     Physical Exam Constitutional:      Appearance: Normal appearance.  Cardiovascular:     Rate and Rhythm: Normal rate and regular rhythm.     Heart sounds: Normal heart sounds.  Pulmonary:     Effort: Pulmonary effort is normal.     Breath sounds: Normal breath sounds.  Musculoskeletal:     Right lower leg: No edema.     Left lower leg: No edema.  Neurological:     Mental Status: She is alert.       EKG:   Recent Labs: 12/27/2023: ALT 11; BUN 14; Creatinine, Ser 1.29; Hemoglobin 12.8; Platelets 263.0; Potassium 4.6; Sodium 142    Lipid Panel    Component Value Date/Time   CHOL 172 05/09/2023 1501   TRIG 117.0 05/09/2023 1501   HDL 56.80 05/09/2023 1501   CHOLHDL 3 05/09/2023 1501   VLDL 23.4 05/09/2023 1501   LDLCALC 92 05/09/2023 1501   LDLDIRECT 179.3 06/20/2013 0919      Other studies Reviewed: Additional studies/ records that were reviewed today include:  Review of the above records demonstrates:       No data to display            ASSESSMENT AND PLAN:    ICD-10-CM   1. Vasovagal syncope  R55 PCV ECHOCARDIOGRAM COMPLETE    MYOCARDIAL PERFUSION IMAGING    pantoprazole  (PROTONIX ) 40 MG tablet   Went to ER last week with presyncope and abdominal pains.    2. Other chest pain  R07.89 PCV ECHOCARDIOGRAM COMPLETE    MYOCARDIAL PERFUSION IMAGING    pantoprazole  (PROTONIX ) 40 MG tablet   Has indigestion type chest pain. Also has DOE, so will do echo and stress test.    3. SOB (shortness of breath)  R06.02 PCV ECHOCARDIOGRAM COMPLETE    MYOCARDIAL PERFUSION IMAGING    pantoprazole  (PROTONIX ) 40 MG tablet    4. HYPERCHOLESTEROLEMIA  E78.00 PCV ECHOCARDIOGRAM COMPLETE    MYOCARDIAL PERFUSION IMAGING    pantoprazole  (PROTONIX ) 40 MG tablet    5. PREDIABETES  R73.09 PCV ECHOCARDIOGRAM COMPLETE     MYOCARDIAL PERFUSION IMAGING    pantoprazole  (PROTONIX ) 40 MG tablet    6. Other chest pain  R07.89 PCV ECHOCARDIOGRAM COMPLETE    MYOCARDIAL PERFUSION IMAGING    pantoprazole  (PROTONIX ) 40 MG tablet       Problem  List Items Addressed This Visit       Other   HYPERCHOLESTEROLEMIA   Relevant Medications   pantoprazole  (PROTONIX ) 40 MG tablet   Other Relevant Orders   PCV ECHOCARDIOGRAM COMPLETE   MYOCARDIAL PERFUSION IMAGING   PREDIABETES   Relevant Medications   pantoprazole  (PROTONIX ) 40 MG tablet   Other Relevant Orders   PCV ECHOCARDIOGRAM COMPLETE   MYOCARDIAL PERFUSION IMAGING   Other Visit Diagnoses       Vasovagal syncope    -  Primary   Went to ER last week with presyncope and abdominal pains.   Relevant Medications   pantoprazole  (PROTONIX ) 40 MG tablet   Other Relevant Orders   PCV ECHOCARDIOGRAM COMPLETE   MYOCARDIAL PERFUSION IMAGING     Other chest pain       Has indigestion type chest pain. Also has DOE, so will do echo and stress test.   Relevant Medications   pantoprazole  (PROTONIX ) 40 MG tablet   Other Relevant Orders   PCV ECHOCARDIOGRAM COMPLETE   MYOCARDIAL PERFUSION IMAGING     SOB (shortness of breath)       Relevant Medications   pantoprazole  (PROTONIX ) 40 MG tablet   Other Relevant Orders   PCV ECHOCARDIOGRAM COMPLETE   MYOCARDIAL PERFUSION IMAGING     Other chest pain       Relevant Medications   pantoprazole  (PROTONIX ) 40 MG tablet   Other Relevant Orders   PCV ECHOCARDIOGRAM COMPLETE   MYOCARDIAL PERFUSION IMAGING          Disposition:   Return in about 5 weeks (around 07/10/2024) for echo, stress test and f/u.    Total time spent: 45 minutes  Signed,  Denyse Bathe, MD  06/05/2024 10:28 AM    [facility] "

## 2024-06-25 ENCOUNTER — Other Ambulatory Visit

## 2024-06-30 ENCOUNTER — Encounter

## 2024-07-08 ENCOUNTER — Ambulatory Visit: Admitting: Cardiovascular Disease

## 2024-07-16 ENCOUNTER — Ambulatory Visit

## 2024-07-18 ENCOUNTER — Encounter: Admitting: Family Medicine

## 2024-07-18 ENCOUNTER — Ambulatory Visit
